# Patient Record
Sex: Male | Born: 1940 | Race: White | Hispanic: No | Marital: Married | Smoking: Former smoker
Health system: Southern US, Community
[De-identification: ages and names within clinical notes are randomized; demographics above are authoritative.]

## PROBLEM LIST (undated history)

## (undated) DIAGNOSIS — N138 Other obstructive and reflux uropathy: Secondary | ICD-10-CM

## (undated) DIAGNOSIS — Z973 Presence of spectacles and contact lenses: Secondary | ICD-10-CM

## (undated) DIAGNOSIS — N401 Enlarged prostate with lower urinary tract symptoms: Secondary | ICD-10-CM

## (undated) DIAGNOSIS — C61 Malignant neoplasm of prostate: Secondary | ICD-10-CM

## (undated) DIAGNOSIS — R339 Retention of urine, unspecified: Secondary | ICD-10-CM

## (undated) DIAGNOSIS — Z87898 Personal history of other specified conditions: Secondary | ICD-10-CM

## (undated) DIAGNOSIS — N529 Male erectile dysfunction, unspecified: Secondary | ICD-10-CM

## (undated) DIAGNOSIS — I10 Essential (primary) hypertension: Secondary | ICD-10-CM

## (undated) HISTORY — PX: PROSTATE BIOPSY: SHX241

## (undated) HISTORY — DX: Essential (primary) hypertension: I10

## (undated) HISTORY — DX: Malignant neoplasm of prostate: C61

---

## 2014-03-05 ENCOUNTER — Encounter: Payer: Self-pay | Admitting: Radiation Oncology

## 2014-03-05 NOTE — Progress Notes (Signed)
GU Location of Tumor / Histology: adenocarcinoma of prostate   If Prostate Cancer, Gleason Score is (3 + 3) and PSA is (6.2) April 2015.  Patient referred to Dr. Karsten Ro by PCP for elevated PSA December of 2012.  Biopsies of prostate (if applicable) revealed:     Past/Anticipated interventions by urology, if any: referred to radiation oncology  Past/Anticipated interventions by medical oncology, if any: None  Weight changes, if any: None noted  Bowel/Bladder complaints, if any: no voiding symptoms, no change in voiding pattern, denies hematuria or dysuria.   Nausea/Vomiting, if any: None noted  Pain issues, if any:  None noted  SAFETY ISSUES:  Prior radiation? NO  Pacemaker/ICD? NO  Possible current pregnancy? NO  Is the patient on methotrexate? NO  Current Complaints / other details:  73 year old. AX. Penicillin. Patient interested in proceeding with radioactive seed implantation. Prostate volume: 23.23 cc.

## 2014-03-08 ENCOUNTER — Encounter: Payer: Self-pay | Admitting: Radiation Oncology

## 2014-03-08 NOTE — Progress Notes (Signed)
Radiation Oncology         [phone] ________________________________  Initial outpatient Consultation  Name: Dustin Ramirez MRN: [id number]  Date: 03/09/2014  DOB: 23-Jun-1941  CC:No primary provider on file.  [name], MD   REFERRING PHYSICIAN: [name], MD  DIAGNOSIS: 73 y.o. gentleman with stage T1c adenocarcinoma of the prostate with a Gleason's score of 3+3 and a PSA of 6.2  HISTORY OF PRESENT ILLNESS::Dustin Ramirez is a 72 y.o. gentleman.  He was noted to have an elevated PSA of 6.2 on 12/31/13  by his primary care PA, Inez Catalina.  Accordingly, he was referred for evaluation in urology by Dr. Karsten Ro and digital rectal examination revealed no nodules within the prostate.  The patient proceeded to transrectal ultrasound with 12 biopsies of the prostate on 02/17/14.  The prostate volume measured 23.23 cc.  Out of 12 core biopsies, 5 were positive.  The maximum Gleason score was 3+3, and this was seen bilaterally as displayed below:    The patient reviewed the biopsy results with his urologist and he has kindly been referred today for discussion of potential radiation treatment options.  PREVIOUS RADIATION THERAPY: No  PAST MEDICAL HISTORY:  has a past medical history of Prostate cancer and Hypertension.    PAST SURGICAL HISTORY: Past Surgical History  Procedure Laterality Date  . Prostate biopsy      FAMILY HISTORY: family history includes Cancer in his father and mother; Diabetes in his brother.  SOCIAL HISTORY:  reports that he has quit smoking. His smoking use included Cigarettes. He smoked 0.00 packs per day. He has never used smokeless tobacco. He reports that he drinks alcohol. He reports that he does not use illicit drugs.  ALLERGIES: Penicillins  MEDICATIONS:  Current Outpatient Prescriptions  Medication Sig Dispense Refill  . docusate sodium (COLACE) 100 MG capsule Take 100 mg by mouth 2 (two) times daily.      [name] lisinopril (PRINIVIL,ZESTRIL) 10  MG tablet Take 10 mg by mouth daily.      . milk thistle 175 MG tablet Take 175 mg by mouth daily.      . Omega-3 Fatty Acids (FISH OIL) 1000 MG CAPS Take by mouth.      . sildenafil (VIAGRA) 50 MG tablet Take 50 mg by mouth daily as needed for erectile dysfunction.       No current facility-administered medications for this encounter.    REVIEW OF SYSTEMS:  A 15 point review of systems is documented in the electronic medical record. This was obtained by the nursing staff. However, I reviewed this with the patient to discuss relevant findings and make appropriate changes.  A comprehensive review of systems was negative..  The patient completed an IPSS and IIEF questionnaire.  His IPSS score was 8 indicating moderate urinary outflow obstructive symptoms.  He indicated that his erectile function is able to complete sexual activity on about half the attempts.   PHYSICAL EXAM: This patient is in no acute distress.  He is alert and oriented.   vitals were not taken for this visit. He exhibits no respiratory distress or labored breathing.  He appears neurologically intact.  His mood is pleasant.  His affect is appropriate.  Please note the digital rectal exam findings described above.  KPS = 100  100 - Normal; no complaints; no evidence of disease. 90   - Able to carry on normal activity; minor signs or symptoms of disease. 80   - Normal activity  with effort; some signs or symptoms of disease. 48   - Cares for self; unable to carry on normal activity or to do active work. 60   - Requires occasional assistance, but is able to care for most of his personal needs. 50   - Requires considerable assistance and frequent medical care. 72   - Disabled; requires special care and assistance. 65   - Severely disabled; hospital admission is indicated although death not imminent. 20   - Very sick; hospital admission necessary; active supportive treatment necessary. 10   - Moribund; fatal processes progressing  rapidly. 0     - Dead  Karnofsky DA, Abelmann WH, Craver LS and Burchenal JH [phone]) The use of the nitrogen mustards in the palliative treatment of carcinoma: with particular reference to bronchogenic carcinoma Cancer 1 634-56   LABORATORY DATA:  No results found for this basename: WBC,  HGB,  HCT,  MCV,  PLT   No results found for this basename: NA,  K,  CL,  CO2   No results found for this basename: ALT,  AST,  GGT,  ALKPHOS,  BILITOT     RADIOGRAPHY: No results found.    IMPRESSION: This gentleman is a 73 y.o. gentleman with stage T1c adenocarcinoma of the prostate with a Gleason's score of 3+3 and a PSA of 6.2.  His T-Stage, Gleason's Score, and PSA put him into the favorable risk group.  Accordingly he is eligible for a variety of potential treatment options including active surveillance, prostatectomy, or radiation in the form of seed implant versus IMRT.  PLAN:Today I reviewed the findings and workup thus far.  We discussed the natural history of prostate cancer.  We reviewed the the implications of T-stage, Gleason's Score, and PSA on decision-making and outcomes in prostate cancer.  We discussed radiation treatment in the management of prostate cancer with regard to the logistics and delivery of external beam radiation treatment as well as the logistics and delivery of prostate brachytherapy.  We compared and contrasted each of these approaches and also compared these against prostatectomy.  The patient expressed interest in prostate brachytherapy.  I filled out a patient counseling form for him with relevant treatment diagrams and we retained a copy for our records.   The patient would like to proceed with prostate brachytherapy.  I will share my findings with Dr. Karsten Ro and move forward with scheduling the procedure in the near future.     I enjoyed meeting with him today, and will look forward to participating in the care of this very nice gentleman.  I spent 60 minutes face to  face with the patient and more than 50% of that time was spent in counseling and/or coordination of care.   ------------------------------------------------  Sheral Apley. Tammi Klippel, M.D.

## 2014-03-09 ENCOUNTER — Ambulatory Visit
Admission: RE | Admit: 2014-03-09 | Discharge: 2014-03-09 | Disposition: A | Discharge status: Home / Self Care | Payer: Medicare Other | Source: Ambulatory Visit | Attending: Radiation Oncology | Admitting: Radiation Oncology

## 2014-03-09 ENCOUNTER — Other Ambulatory Visit: Payer: Self-pay | Admitting: Urology

## 2014-03-09 ENCOUNTER — Encounter: Payer: Self-pay | Admitting: Radiation Oncology

## 2014-03-09 VITALS — BP 132/70 | HR 82 | Temp 98.0°F | Resp 16 | Ht 71.0 in | Wt 191.2 lb

## 2014-03-09 DIAGNOSIS — C61 Malignant neoplasm of prostate: Secondary | ICD-10-CM | POA: Insufficient documentation

## 2014-03-09 DIAGNOSIS — Z510 Encounter for antineoplastic radiation therapy: Secondary | ICD-10-CM | POA: Insufficient documentation

## 2014-03-09 DIAGNOSIS — Z87891 Personal history of nicotine dependence: Secondary | ICD-10-CM | POA: Diagnosis not present

## 2014-03-09 DIAGNOSIS — I10 Essential (primary) hypertension: Secondary | ICD-10-CM | POA: Insufficient documentation

## 2014-03-09 NOTE — Progress Notes (Signed)
Reports nocturia x1. Denies hematuria or dysuria. Reports ten pound weight loss over the last year. Denies bone pain. Denies urgency or frequency. Reports a steady stream without difficulty emptying. Denies nausea, vomiting, headache or dizziness. Most interested in seeds. Cares for his wife who has had two strokes. Requesting surgery be scheduled on a Thursday or Friday with notice so his son from [place] can come to help care for his father and mother.

## 2014-03-10 ENCOUNTER — Telehealth: Payer: Self-pay | Admitting: *Deleted

## 2014-03-10 NOTE — Telephone Encounter (Signed)
CALLED PATIENT TO INFORM OF PRE-SEED APPT. AND IMPLANT, SPOKE WITH PATIENT AND HE IS AWARE OF THESE APPTS.

## 2014-03-12 ENCOUNTER — Telehealth: Payer: Self-pay | Admitting: *Deleted

## 2014-03-12 NOTE — Telephone Encounter (Signed)
Called patient to remind of appts. For 03-13-14, spoke with patient and he is aware of these appts.

## 2014-03-13 ENCOUNTER — Ambulatory Visit
Admission: RE | Admit: 2014-03-13 | Discharge: 2014-03-13 | Disposition: A | Discharge status: Home / Self Care | Payer: Medicare Other | Source: Ambulatory Visit | Attending: Radiation Oncology | Admitting: Radiation Oncology

## 2014-03-13 ENCOUNTER — Encounter: Payer: Self-pay | Admitting: Radiation Oncology

## 2014-03-13 ENCOUNTER — Encounter (HOSPITAL_BASED_OUTPATIENT_CLINIC_OR_DEPARTMENT_OTHER)
Admission: RE | Admit: 2014-03-13 | Discharge: 2014-03-13 | Disposition: A | Discharge status: Home / Self Care | Payer: Medicare Other | Source: Ambulatory Visit | Attending: Urology | Admitting: Urology

## 2014-03-13 ENCOUNTER — Other Ambulatory Visit: Payer: Self-pay

## 2014-03-13 ENCOUNTER — Ambulatory Visit (HOSPITAL_COMMUNITY)
Admission: RE | Admit: 2014-03-13 | Discharge: 2014-03-13 | Disposition: A | Discharge status: Home / Self Care | Payer: Medicare Other | Source: Ambulatory Visit | Attending: Urology | Admitting: Urology

## 2014-03-13 DIAGNOSIS — C61 Malignant neoplasm of prostate: Secondary | ICD-10-CM | POA: Diagnosis not present

## 2014-03-13 DIAGNOSIS — Z510 Encounter for antineoplastic radiation therapy: Secondary | ICD-10-CM | POA: Diagnosis not present

## 2014-03-13 DIAGNOSIS — Z87891 Personal history of nicotine dependence: Secondary | ICD-10-CM | POA: Diagnosis not present

## 2014-03-13 DIAGNOSIS — Z01818 Encounter for other preprocedural examination: Secondary | ICD-10-CM | POA: Insufficient documentation

## 2014-03-13 DIAGNOSIS — Z01810 Encounter for preprocedural cardiovascular examination: Secondary | ICD-10-CM | POA: Insufficient documentation

## 2014-03-13 NOTE — Progress Notes (Signed)
  Radiation Oncology         [phone] ________________________________  Name: Rosana Hoes. Hechler MRN: [id number]  Date: 03/13/2014  DOB: [birth year]  SIMULATION AND TREATMENT PLANNING NOTE PUBIC ARCH STUDY  CC:No PCP Per Patient  [name], MD  DIAGNOSIS: 73 y.o. gentleman with stage T1c adenocarcinoma of the prostate with a Gleason's score of 3+3 and a PSA of 6.2  COMPLEX SIMULATION:  The patient presented today for evaluation for possible prostate seed implant. He was brought to the radiation planning suite and placed supine on the CT couch. A 3-dimensional image study set was obtained in upload to the planning computer. There, on each axial slice, I contoured the prostate gland. Then, using three-dimensional radiation planning tools I reconstructed the prostate in view of the structures from the transperineal needle pathway to assess for possible pubic arch interference. In doing so, I did not appreciate any pubic arch interference. Also, the patient's prostate volume was estimated based on the drawn structure. The volume was 29.57 cc.  Given the pubic arch appearance and prostate volume, patient remains a good candidate to proceed with prostate seed implant. Today, he freely provided informed written consent to proceed.    PLAN: The patient will undergo prostate seed implant.   ________________________________  Sheral Apley. Tammi Klippel, M.D.

## 2014-04-30 ENCOUNTER — Telehealth: Payer: Self-pay | Admitting: *Deleted

## 2014-04-30 NOTE — Telephone Encounter (Signed)
Called patient to remind of lab appt. For 05-01-14, spoke with patient and he is aware of this appt.

## 2014-05-01 ENCOUNTER — Encounter (HOSPITAL_BASED_OUTPATIENT_CLINIC_OR_DEPARTMENT_OTHER): Payer: Self-pay | Admitting: *Deleted

## 2014-05-01 DIAGNOSIS — C61 Malignant neoplasm of prostate: Secondary | ICD-10-CM | POA: Diagnosis present

## 2014-05-01 DIAGNOSIS — K219 Gastro-esophageal reflux disease without esophagitis: Secondary | ICD-10-CM | POA: Diagnosis not present

## 2014-05-01 DIAGNOSIS — Z79899 Other long term (current) drug therapy: Secondary | ICD-10-CM | POA: Diagnosis not present

## 2014-05-01 DIAGNOSIS — Z800 Family history of malignant neoplasm of digestive organs: Secondary | ICD-10-CM | POA: Diagnosis not present

## 2014-05-01 DIAGNOSIS — I10 Essential (primary) hypertension: Secondary | ICD-10-CM | POA: Diagnosis not present

## 2014-05-01 DIAGNOSIS — Z880 Allergy status to penicillin: Secondary | ICD-10-CM | POA: Diagnosis not present

## 2014-05-01 DIAGNOSIS — N529 Male erectile dysfunction, unspecified: Secondary | ICD-10-CM | POA: Diagnosis not present

## 2014-05-01 DIAGNOSIS — Z7982 Long term (current) use of aspirin: Secondary | ICD-10-CM | POA: Diagnosis not present

## 2014-05-01 DIAGNOSIS — Z87891 Personal history of nicotine dependence: Secondary | ICD-10-CM | POA: Diagnosis not present

## 2014-05-01 LAB — CBC
HCT: 44.3 % (ref 39.0–52.0)
Hemoglobin: 15.5 g/dL (ref 13.0–17.0)
MCH: 32 pg (ref 26.0–34.0)
MCHC: 35 g/dL (ref 30.0–36.0)
MCV: 91.3 fL (ref 78.0–100.0)
Platelets: 173 10*3/uL (ref 150–400)
RBC: 4.85 MIL/uL (ref 4.22–5.81)
RDW: 12.8 % (ref 11.5–15.5)
WBC: 6.4 10*3/uL (ref 4.0–10.5)

## 2014-05-01 LAB — COMPREHENSIVE METABOLIC PANEL
ALT: 157 U/L — ABNORMAL HIGH (ref 0–53)
AST: 91 U/L — ABNORMAL HIGH (ref 0–37)
Albumin: 4.1 g/dL (ref 3.5–5.2)
Alkaline Phosphatase: 67 U/L (ref 39–117)
Anion gap: 15 (ref 5–15)
BUN: 15 mg/dL (ref 6–23)
CO2: 26 mEq/L (ref 19–32)
Calcium: 10.5 mg/dL (ref 8.4–10.5)
Chloride: 98 mEq/L (ref 96–112)
Creatinine, Ser: 0.94 mg/dL (ref 0.50–1.35)
GFR calc Af Amer: >90 mL/min (ref >90)
GFR calc non Af Amer: 82 mL/min — ABNORMAL LOW (ref >90)
Glucose, Bld: 114 mg/dL — ABNORMAL HIGH (ref 70–99)
Potassium: 4.8 mEq/L (ref 3.7–5.3)
Sodium: 139 mEq/L (ref 137–147)
Total Bilirubin: 0.6 mg/dL (ref 0.3–1.2)
Total Protein: 7.8 g/dL (ref 6.0–8.3)

## 2014-05-01 LAB — APTT: aPTT: 28 seconds (ref 24–37)

## 2014-05-01 LAB — PROTIME-INR
INR: 1.04 (ref 0.00–1.49)
Prothrombin Time: 13.6 seconds (ref 11.6–15.2)

## 2014-05-01 NOTE — Progress Notes (Signed)
05/01/14 0919  OBSTRUCTIVE SLEEP APNEA  Have you ever been diagnosed with sleep apnea through a sleep study? No  Do you snore loudly (loud enough to be heard through closed doors)?  1  Do you often feel tired, fatigued, or sleepy during the daytime? 0  Has anyone observed you stop breathing during your sleep? 0  Do you have, or are you being treated for high blood pressure? 1  BMI more than 35 kg/m2? 0  Age over 72 years old? 1  Neck circumference greater than 40 cm/16 inches? 0  Gender: 1  Obstructive Sleep Apnea Score 4  Score 4 or greater  Results sent to PCP

## 2014-05-01 NOTE — Progress Notes (Signed)
NPO AFTER MN. ARRIVE AT 0800. CURRENT CXR, EKG, AND LAB RESULTS IN EPIC AND CHART. WILL DO FLEET ENEMA AM DOS.

## 2014-05-07 ENCOUNTER — Telehealth: Payer: Self-pay | Admitting: *Deleted

## 2014-05-07 NOTE — Telephone Encounter (Signed)
CALLED PATIENT TO REMIND OF PROCEDURE FOR 05-08-14, LVM FOR A RETURN CALL

## 2014-05-08 ENCOUNTER — Encounter (HOSPITAL_BASED_OUTPATIENT_CLINIC_OR_DEPARTMENT_OTHER)
Admission: RE | Disposition: A | Discharge status: Home / Self Care | Payer: Self-pay | Source: Ambulatory Visit | Attending: Urology

## 2014-05-08 ENCOUNTER — Encounter (HOSPITAL_BASED_OUTPATIENT_CLINIC_OR_DEPARTMENT_OTHER): Payer: Medicare Other | Admitting: Anesthesiology

## 2014-05-08 ENCOUNTER — Encounter (HOSPITAL_BASED_OUTPATIENT_CLINIC_OR_DEPARTMENT_OTHER): Payer: Self-pay | Admitting: Anesthesiology

## 2014-05-08 ENCOUNTER — Ambulatory Visit (HOSPITAL_BASED_OUTPATIENT_CLINIC_OR_DEPARTMENT_OTHER): Payer: Medicare Other | Admitting: Anesthesiology

## 2014-05-08 ENCOUNTER — Ambulatory Visit (HOSPITAL_BASED_OUTPATIENT_CLINIC_OR_DEPARTMENT_OTHER)
Admission: RE | Admit: 2014-05-08 | Discharge: 2014-05-08 | Disposition: A | Discharge status: Home / Self Care | Payer: Medicare Other | Source: Ambulatory Visit | Attending: Urology | Admitting: Urology

## 2014-05-08 ENCOUNTER — Ambulatory Visit (HOSPITAL_COMMUNITY): Payer: Medicare Other

## 2014-05-08 DIAGNOSIS — I10 Essential (primary) hypertension: Secondary | ICD-10-CM | POA: Insufficient documentation

## 2014-05-08 DIAGNOSIS — C61 Malignant neoplasm of prostate: Secondary | ICD-10-CM | POA: Insufficient documentation

## 2014-05-08 DIAGNOSIS — Z7982 Long term (current) use of aspirin: Secondary | ICD-10-CM | POA: Insufficient documentation

## 2014-05-08 DIAGNOSIS — Z79899 Other long term (current) drug therapy: Secondary | ICD-10-CM | POA: Insufficient documentation

## 2014-05-08 DIAGNOSIS — N529 Male erectile dysfunction, unspecified: Secondary | ICD-10-CM | POA: Diagnosis not present

## 2014-05-08 DIAGNOSIS — Z800 Family history of malignant neoplasm of digestive organs: Secondary | ICD-10-CM | POA: Insufficient documentation

## 2014-05-08 DIAGNOSIS — Z880 Allergy status to penicillin: Secondary | ICD-10-CM | POA: Insufficient documentation

## 2014-05-08 DIAGNOSIS — K219 Gastro-esophageal reflux disease without esophagitis: Secondary | ICD-10-CM | POA: Diagnosis not present

## 2014-05-08 DIAGNOSIS — Z87891 Personal history of nicotine dependence: Secondary | ICD-10-CM | POA: Insufficient documentation

## 2014-05-08 HISTORY — PX: RADIOACTIVE SEED IMPLANT: SHX5150

## 2014-05-08 HISTORY — DX: Male erectile dysfunction, unspecified: N52.9

## 2014-05-08 HISTORY — DX: Presence of spectacles and contact lenses: Z97.3

## 2014-05-08 SURGERY — INSERTION, RADIATION SOURCE, PROSTATE
Anesthesia: General | Site: Prostate

## 2014-05-08 MED ORDER — PROPOFOL 10 MG/ML IV BOLUS
INTRAVENOUS | Status: DC | PRN
  Administered 2014-05-08: 160 mg via INTRAVENOUS
  Administered 2014-05-08: 30 mg via INTRAVENOUS

## 2014-05-08 MED ORDER — FENTANYL CITRATE 0.05 MG/ML IJ SOLN
INTRAMUSCULAR | Status: AC
  Filled 2014-05-08: qty 2

## 2014-05-08 MED ORDER — MIDAZOLAM HCL 2 MG/2ML IJ SOLN
INTRAMUSCULAR | Status: AC
  Filled 2014-05-08: qty 2

## 2014-05-08 MED ORDER — TAMSULOSIN HCL 0.4 MG PO CAPS: 0.4000 mg | ORAL_CAPSULE | ORAL | Status: DC

## 2014-05-08 MED ORDER — MIDAZOLAM HCL 5 MG/5ML IJ SOLN
INTRAMUSCULAR | Status: DC | PRN
  Administered 2014-05-08: 2 mg via INTRAVENOUS

## 2014-05-08 MED ORDER — LIDOCAINE HCL (CARDIAC) 20 MG/ML IV SOLN
INTRAVENOUS | Status: DC | PRN
  Administered 2014-05-08: 70 mg via INTRAVENOUS

## 2014-05-08 MED ORDER — ACETAMINOPHEN 10 MG/ML IV SOLN
INTRAVENOUS | Status: DC | PRN
  Administered 2014-05-08: 1000 mg via INTRAVENOUS

## 2014-05-08 MED ORDER — HYDROCODONE-ACETAMINOPHEN 5-325 MG PO TABS
ORAL_TABLET | ORAL | Status: AC
  Filled 2014-05-08: qty 1

## 2014-05-08 MED ORDER — KETOROLAC TROMETHAMINE 30 MG/ML IJ SOLN
INTRAMUSCULAR | Status: DC | PRN
  Administered 2014-05-08: 15 mg via INTRAVENOUS

## 2014-05-08 MED ORDER — CIPROFLOXACIN IN D5W 400 MG/200ML IV SOLN
400.0000 mg | INTRAVENOUS | Status: AC
  Administered 2014-05-08: 400 mg via INTRAVENOUS
  Filled 2014-05-08: qty 200

## 2014-05-08 MED ORDER — STERILE WATER FOR IRRIGATION IR SOLN
Status: DC | PRN
  Administered 2014-05-08: 3 mL
  Administered 2014-05-08: 3000 mL

## 2014-05-08 MED ORDER — IOHEXOL 350 MG/ML SOLN
INTRAVENOUS | Status: DC | PRN
  Administered 2014-05-08: 7 mL via INTRAVENOUS

## 2014-05-08 MED ORDER — DEXAMETHASONE SODIUM PHOSPHATE 4 MG/ML IJ SOLN
INTRAMUSCULAR | Status: DC | PRN
  Administered 2014-05-08: 4 mg via INTRAVENOUS

## 2014-05-08 MED ORDER — FENTANYL CITRATE 0.05 MG/ML IJ SOLN
INTRAMUSCULAR | Status: DC | PRN
  Administered 2014-05-08 (×3): 50 ug via INTRAVENOUS

## 2014-05-08 MED ORDER — FLEET ENEMA 7-19 GM/118ML RE ENEM
1.0000 | ENEMA | Freq: Once | RECTAL | Status: DC
  Filled 2014-05-08: qty 1

## 2014-05-08 MED ORDER — LACTATED RINGERS IV SOLN
INTRAVENOUS | Status: DC
  Filled 2014-05-08: qty 1000

## 2014-05-08 MED ORDER — ONDANSETRON HCL 4 MG/2ML IJ SOLN
INTRAMUSCULAR | Status: DC | PRN
  Administered 2014-05-08: 4 mg via INTRAVENOUS

## 2014-05-08 MED ORDER — FENTANYL CITRATE 0.05 MG/ML IJ SOLN
25.0000 ug | INTRAMUSCULAR | Status: DC | PRN
  Administered 2014-05-08: 50 ug via INTRAVENOUS
  Filled 2014-05-08: qty 1

## 2014-05-08 MED ORDER — HYDROCODONE-ACETAMINOPHEN 10-325 MG PO TABS: 1.0000 | ORAL_TABLET | ORAL | Status: DC | PRN

## 2014-05-08 MED ORDER — FENTANYL CITRATE 0.05 MG/ML IJ SOLN
INTRAMUSCULAR | Status: AC
  Filled 2014-05-08: qty 4

## 2014-05-08 MED ORDER — LACTATED RINGERS IV SOLN
INTRAVENOUS | Status: DC
  Administered 2014-05-08: 09:00:00 via INTRAVENOUS
  Filled 2014-05-08: qty 1000

## 2014-05-08 MED ORDER — CIPROFLOXACIN HCL 500 MG PO TABS: 500.0000 mg | ORAL_TABLET | Freq: Two times a day (BID) | ORAL | Status: DC

## 2014-05-08 SURGICAL SUPPLY — 26 items
BAG URINE DRAINAGE (UROLOGICAL SUPPLIES) ×2 IMPLANT
BLADE CLIPPER SURG (BLADE) ×2 IMPLANT
CATH FOLEY 2WAY SLVR  5CC 16FR (CATHETERS) ×2
CATH FOLEY 2WAY SLVR 5CC 16FR (CATHETERS) ×2 IMPLANT
CATH ROBINSON RED A/P 20FR (CATHETERS) ×2 IMPLANT
CLOTH BEACON ORANGE TIMEOUT ST (SAFETY) ×2 IMPLANT
COVER MAYO STAND STRL (DRAPES) ×2 IMPLANT
COVER TABLE BACK 60X90 (DRAPES) ×2 IMPLANT
DRSG TEGADERM 4X4.75 (GAUZE/BANDAGES/DRESSINGS) ×2 IMPLANT
DRSG TEGADERM 8X12 (GAUZE/BANDAGES/DRESSINGS) ×2 IMPLANT
GLOVE BIO SURGEON STRL SZ7.5 (GLOVE) IMPLANT
GLOVE BIO SURGEON STRL SZ8 (GLOVE) ×4 IMPLANT
GLOVE BIOGEL PI IND STRL 7.5 (GLOVE) ×1 IMPLANT
GLOVE BIOGEL PI INDICATOR 7.5 (GLOVE) ×1
GLOVE ECLIPSE 8.0 STRL XLNG CF (GLOVE) ×8 IMPLANT
GLOVE INDICATOR 7.5 STRL GRN (GLOVE) ×2 IMPLANT
GOWN STRL REUS W/ TWL XL LVL3 (GOWN DISPOSABLE) ×2 IMPLANT
GOWN STRL REUS W/TWL XL LVL3 (GOWN DISPOSABLE) ×2
HOLDER FOLEY CATH W/STRAP (MISCELLANEOUS) ×2 IMPLANT
PACK CYSTOSCOPY (CUSTOM PROCEDURE TRAY) ×2 IMPLANT
SPONGE GAUZE 4X4 12PLY STER LF (GAUZE/BANDAGES/DRESSINGS) ×2 IMPLANT
SYRINGE 10CC LL (SYRINGE) ×2 IMPLANT
UNDERPAD 30X30 INCONTINENT (UNDERPADS AND DIAPERS) ×4 IMPLANT
WATER STERILE IRR 3000ML UROMA (IV SOLUTION) ×2 IMPLANT
WATER STERILE IRR 500ML POUR (IV SOLUTION) ×2 IMPLANT
radioactive seeds ×156 IMPLANT

## 2014-05-08 NOTE — Op Note (Signed)
PATIENT:  Dustin Ramirez  PRE-OPERATIVE DIAGNOSIS:  Adenocarcinoma of the prostate  POST-OPERATIVE DIAGNOSIS:  Same  PROCEDURE:  Procedure(s): 1. I-125 radioactive seed implantation 2. Cystoscopy  SURGEON:  Surgeon(s): [name]  Radiation oncologist: Dr. Tyler Pita  ANESTHESIA:  General  EBL:  Minimal  DRAINS: 16 French Foley catheter  INDICATION: Dustin Ramirez is a 73 year old male with biopsy-proven adenocarcinoma of the prostate Gleason score 3+3 = 6 in 5 cores involving both lobes of the prostate. We discussed the treatment options. He does have erectile dysfunction currently managed with Viagra. He has elected to proceed with radioactive seed implantation.  Description of procedure: After informed consent the patient was brought to the major OR, placed on the table and administered general anesthesia. He was then moved to the modified lithotomy position with his perineum perpendicular to the floor. His perineum and genitalia were then sterilely prepped. An official timeout was then performed. A 16 French Foley catheter was then placed in the bladder and filled with dilute contrast, a rectal tube was placed in the rectum and the transrectal ultrasound probe was placed in the rectum and affixed to the stand. He was then sterilely draped.  Real time ultrasonography was used along with the seed planning software spot-pro version 3.1-00. This was used to develop the seed plan including the number of needles as well as number of seeds required for complete and adequate coverage. Real-time ultrasonography was then used along with the previously developed plan and the Nucletron device to implant a total of 78 seeds using 24 needles. This proceeded without difficulty or complication.  A Foley catheter was then removed as well as the transrectal ultrasound probe and rectal probe. Flexible cystoscopy was then performed using the 17 French flexible scope which revealed a normal urethra  throughout its length down to the sphincter which appeared intact. The prostatic urethra revealed bilobar hypertrophy but no evidence of obstruction, seeds, spacers or lesions. The bladder was then entered and fully and systematically inspected. The ureteral orifices were noted to be of normal configuration and position. The mucosa revealed no evidence of tumors. There were also no stones identified within the bladder. I noted no seeds or spacers on the floor of the bladder and retroflexion of the scope revealed no seeds protruding from the base of the prostate.  The cystoscope was then removed and a new 67 French Foley catheter was then inserted and the balloon was filled with 10 cc of sterile water. This was connected to closed system drainage and the patient was awakened and taken to recovery room in stable and satisfactory condition. He tolerated procedure well and there were no intraoperative complications.

## 2014-05-08 NOTE — Procedures (Signed)
  Radiation Oncology         ([phone]) [phone] ________________________________  Name: Dustin Ramirez. Cheuvront MRN: [id number]  Date: 05/08/2014  DOB: [birth year]       Prostate Seed Implant  CC:No PCP Per Patient  No ref. provider found  DIAGNOSIS: 73 y.o. gentleman with stage T1c adenocarcinoma of the prostate with a Gleason's score of 3+3 and a PSA of 6.2  PROCEDURE: Insertion of radioactive I-125 seeds into the prostate gland.  RADIATION DOSE: 145 Gy, definitive therapy.  TECHNIQUE: Oran K. Vaile was brought to the operating room with the urologist. He was placed in the dorsolithotomy position. He was catheterized and a rectal tube was inserted. The perineum was shaved, prepped and draped. The ultrasound probe was then introduced into the rectum to see the prostate gland.  TREATMENT DEVICE: A needle grid was attached to the ultrasound probe stand and anchor needles were placed.  3D PLANNING: The prostate was imaged in 3D using a sagittal sweep of the prostate probe. These images were transferred to the planning computer. There, the prostate, urethra and rectum were defined on each axial reconstructed image. Then, the software created an optimized 3D plan and a few seed positions were adjusted. The quality of the plan was reviewed using [facility] information for the target and the following two organs at risk:  Urethra and Rectum.  Then the accepted plan was uploaded to the seed Selectron afterloading unit.  PROSTATE VOLUME STUDY:  Using transrectal ultrasound the volume of the prostate was verified to be 34.96 cc.  SPECIAL TREATMENT PROCEDURE/SUPERVISION AND HANDLING: The Nucletron FIRST system was used to place the needles under sagittal guidance. A total of 24 needles were used to deposit 78 seeds in the prostate gland. The individual seed activity was 0.395 mCi for a total implant activity of 30.81 mCi.  COMPLEX SIMULATION: At the end of the procedure, an anterior radiograph of the pelvis was obtained to  document seed positioning and count. Cystoscopy was performed to check the urethra and bladder.  MICRODOSIMETRY: At the end of the procedure, the patient was emitting 0.05 mrem/hr at 1 meter. Accordingly, he was considered safe for hospital discharge.  PLAN: The patient will return to the radiation oncology clinic for post implant CT dosimetry in three weeks.   ________________________________  Sheral Apley Tammi Klippel, M.D.

## 2014-05-08 NOTE — Anesthesia Postprocedure Evaluation (Signed)
  Anesthesia Post-op Note  Patient: Dustin Ramirez. Collard  Procedure(s) Performed: Procedure(s) (LRB): RADIOACTIVE SEED IMPLANT, cystoscopy (N/A)  Patient Location: PACU  Anesthesia Type: General  Level of Consciousness: awake and alert   Airway and Oxygen Therapy: Patient Spontanous Breathing  Post-op Pain: mild  Post-op Assessment: Post-op Vital signs reviewed, Patient's Cardiovascular Status Stable, Respiratory Function Stable, Patent Airway and No signs of Nausea or vomiting  Last Vitals:  Filed Vitals:   05/08/14 1200  BP: 126/61  Pulse: 59  Temp:   Resp: 11    Post-op Vital Signs: stable   Complications: No apparent anesthesia complications

## 2014-05-08 NOTE — H&P (Signed)
[name] is a 73 year old male with newly diagnosed prostate cancer.    History of Present Illness Adenocarcinoma the prostate: He was found to have an elevated PSA and 4/15 with no abnormality noted on DRE. There is no family history of prostate cancer.    PSAs:  12/12 - 2.9  4/15 - 6.2  TRUS/BX 02/17/14: Prostate volume - 23 cc with no abnormality noted on ultrasound.  Pathology: Adenocarcinoma Gleason 3+3 = 6 in both lobes 5/12 cores positive.     Erectile dysfunction: This has been managed with Viagra 50 mg.    Interval history: He reported having no significant difficulties following his prostate biopsy.   Past Medical History Problems  1. History of hypertension (V12.59)  Surgical History Problems  1. History of Biopsy Of The Prostate Needle 2. History of No Surgical Problems  Current Meds 1. Aspirin 325 MG Oral Tablet;  Therapy: (Recorded:30Apr2015) to Recorded 2. CefTRIAXone Sodium 1 GM Injection Solution Reconstituted; INJECT 1  GM  Intramuscular To be done befor procedure; To Be Done: [id number]; Status: HOLD FOR  - Administration Ordered 3. Fish Oil 1000 MG Oral Capsule;  Therapy: (Recorded:30Apr2015) to Recorded 4. Lisinopril 10 MG Oral Tablet;  Therapy: (Recorded:30Apr2015) to Recorded 5. Milk Thistle CAPS;  Therapy: (Recorded:30Apr2015) to Recorded  Allergies Medication  1. Penicillins  Family History Problems  1. Family history of Colon cancer : Mother 2. Family history of diabetes mellitus (V18.0) : Brother 3. Family history of lung cancer (V16.1) : Father  Social History Problems  1. Alcohol use (V49.89)   2 glasses of wine at night 2. Caffeine use (V49.89)   2 coffees in the morning 3. Father deceased 65. Former smoker (V15.82) 5. Married 6. Mother deceased 6. Number of children   1 daughter; 1 son   Review of Systems Genitourinary, constitutional, skin, eye, otolaryngeal, hematologic/lymphatic, cardiovascular, pulmonary,  endocrine, musculoskeletal, gastrointestinal, neurological and psychiatric system(s) were reviewed and pertinent findings if present are noted.  Genitourinary: urinary frequency and erectile dysfunction.  Gastrointestinal: heartburn.  Eyes: blurred vision.    Vitals Vital Signs  Height: 5 ft 11 in Weight: 194 lb  BMI Calculated: 27.06 BSA Calculated: 2.08 Blood Pressure: 145 / 82 Temperature: 96.9 F Heart Rate: 63  Physical Exam Constitutional: Well nourished and well developed . No acute distress.  ENT:. The ears and nose are normal in appearance.  Neck: The appearance of the neck is normal and no neck mass is present.  Pulmonary: No respiratory distress and normal respiratory rhythm and effort.  Cardiovascular: Heart rate and rhythm are normal . No peripheral edema.  Abdomen: The abdomen is soft and nontender. No masses are palpated. No CVA tenderness. No hernias are palpable. No hepatosplenomegaly noted.  Rectal: Rectal exam demonstrates normal sphincter tone, no tenderness and no masses. Estimated prostate size is 1+. There is obliteration of the median sulcus. The prostate has no nodularity and is not tender. The left seminal vesicle is nonpalpable. The right seminal vesicle is nonpalpable. The perineum is normal on inspection.  Genitourinary: Examination of the penis demonstrates no discharge, no masses, no lesions and a normal meatus. The scrotum is without lesions. The right epididymis is palpably normal and non-tender. The left epididymis is palpably normal and non-tender. The right testis is non-tender and without masses. The left testis is non-tender and without masses.  Lymphatics: The femoral and inguinal nodes are not enlarged or tender.  Skin: Normal skin turgor, no visible rash and no visible skin lesions.  Neuro/Psych:.  Mood and affect are appropriate.   Partin table results: He has a 69% chance of organ confined disease with a 24% chance of extracapsular extension and a  2% probability of seminal vesicle and a 2% probability of lymph node involvement.   Assessment Assessed  1. Adenocarcinoma of prostate (185)  I went over his pathology report with him today as well as the significance of his Gleason score, number and location of cores positive and percent of cores positive. We then discussed his Partin table results in detail and the significance of these predictions as far as prognosis and need for further workup are concerned. I then discussed with him the various options available including active surveillance and treatment for cure such as radiation and surgery.    Plan Adenocarcinoma of prostate   The patient was counseled about the natural history of prostate cancer and the standard treatment options that are available for prostate cancer. It was explained to him how his age and life expectancy, clinical stage, Gleason score, and PSA affect his prognosis, the decision to proceed with additional staging studies, as well as how that information influences recommended treatment strategies. We discussed the roles for active surveillance, radiation therapy, surgical therapy, androgen deprivation, as well as ablative therapy options for the treatment of prostate cancer as appropriate to his individual cancer situation. We discussed the risks and benefits of these options with regard to their impact on cancer control and also in terms of potential adverse events, complications, and impact on quiality of life particularly related to urinary, bowel, and sexual function. The patient was encouraged to ask questions throughout the discussion today and all questions were answered to his stated satisfaction. In addition, the patient was provided with and/or directed to appropriate resources and literature for further education about prostate cancer and treatment options.     He has elected to proceed with radioactive seed implantation.

## 2014-05-08 NOTE — Discharge Instructions (Signed)
DISCHARGE INSTRUCTIONS FOR PROSTATE SEED IMPLANTATION ° °Removal of catheter °Remove the foley catheter after 24 hours ( day after the procedure).can be done easily by cutting the side port of the catheter, whichallow the balloon to deflate.  You will see 1-2 teaspoons of clear water as the balloon deflates and then the catheter can be slid out without difficulty. ° ° °     Cut here ° °Antibiotics °You may be given a prescription for an antibiotic to take when you arrive home. If so, be sure to take every tablet in the bottle, even if you are feeling better before the prescription is finished. If you begin itching, notice a rash or start to swell on your trunk, arms, legs and/or throat, immediately stop taking the antibiotic and call your Urologist. °Diet °Resume your usual diet when you return home. To keep your bowels moving easily and softly, drink prune, apple and cranberry juice at room temperature. You may also take a stool softener, such as Colace, which is available without prescription at local pharmacies. °Daily activities °  No driving or heavy lifting for at least two days after the implant. °  No bike riding, horseback riding or riding lawn mowers for the first month after the implant. °  Any strenuous physical activity should be approved by your doctor before you resume it. °Sexual relations °You may resume sexual relations two weeks after the procedure. A condom should be used for the first two weeks. Your semen may be dark brown or black; this is normal and is related bleeding that may have occurred during the implant. °Postoperative swelling °Expect swelling and bruising of the scrotum and perineum (the area between the scrotum and anus). Both the swelling and the bruising should resolve in l or 2 weeks. Ice packs and over- the-counter medications such as Tylenol, Advil or Aleve may lessen your discomfort. °Postoperative urination °Most men experience burning on urination and/or urinary frequency.  If this becomes bothersome, contact your Urologist.  Medication can be prescribed to relieve these problems.  It is normal to have some blood in your urine for a few days after the implant. °Special instructions related to the seeds °It is unlikely that you will pass an Iodine-125 seed in your urine. The seeds are silver in color and are about as large as a grain of rice. If you pass a seed, do not handle it with your fingers. Use a spoon to place it in an envelope or jar in place this in base occluded area such as the garage or basement for return to the radiation clinic at your convenience. ° °Contact your doctor for °  Temperature greater than 101 F °  Increasing pain °  Inability to urinate °Follow-up ° You should have follow up with your urologist and radiation oncologist about 3 weeks after the procedure. °General information regarding Iodine seeds °  Iodine-125 is a low energy radioactive material. It is not deeply penetrating and loses energy at short distances. Your prostate will absorb the radiation. Objects that are touched or used by the patient do not become radioactive. °  Body wastes (urine and stool) or body fluids (saliva, tears, semen or blood) are not radioactive. °  The Nuclear Regulatory Commission (NRC) has determined that no radiation precautions are needed for patients undergoing Iodine-125 seed implantation. The NRC states that such patients do not present a risk to the people around them, including young children and pregnant women. However, in keeping with the general principle   that radiation exposure should be kept as low reasonably possible, we suggest the following: °  Children and pets should not sit on the patient's lap for the first two (2) weeks after the implant. °  Pregnant (or possibly pregnant) women should avoid prolonged, close contact with the patient for the first two (2) weeks after the implant. °  A distance of three (3) feet is acceptable. °At a distance of three (3)  feet, there is no limit to the length of time anyone can be with the patient. °Post Anesthesia Home Care Instructions ° °Activity: °Get plenty of rest for the remainder of the day. A responsible adult should stay with you for 24 hours following the procedure.  °For the next 24 hours, DO NOT: °-Drive a car °-Operate machinery °-Drink alcoholic beverages °-Take any medication unless instructed by your physician °-Make any legal decisions or sign important papers. ° °Meals: °Start with liquid foods such as gelatin or soup. Progress to regular foods as tolerated. Avoid greasy, spicy, heavy foods. If nausea and/or vomiting occur, drink only clear liquids until the nausea and/or vomiting subsides. Call your physician if vomiting continues. ° °Special Instructions/Symptoms: °Your throat may feel dry or sore from the anesthesia or the breathing tube placed in your throat during surgery. If this causes discomfort, gargle with warm salt water. The discomfort should disappear within 24 hours. °   °

## 2014-05-08 NOTE — Anesthesia Procedure Notes (Signed)
Procedure Name: LMA Insertion Date/Time: 05/08/2014 9:48 AM Performed by: Bethena Roys T Pre-anesthesia Checklist: Patient identified, Emergency Drugs available, Suction available and Patient being monitored Patient Re-evaluated:Patient Re-evaluated prior to inductionOxygen Delivery Method: Circle System Utilized Preoxygenation: Pre-oxygenation with 100% oxygen Intubation Type: IV induction Ventilation: Mask ventilation without difficulty LMA: LMA inserted LMA Size: 5.0 Number of attempts: 1 Airway Equipment and Method: bite block Placement Confirmation: positive ETCO2 Dental Injury: Teeth and Oropharynx as per pre-operative assessment

## 2014-05-08 NOTE — Transfer of Care (Signed)
Immediate Anesthesia Transfer of Care Note  Patient: Dustin Ramirez. Judeth Horn  Procedure(s) Performed: Procedure(s) with comments: RADIOACTIVE SEED IMPLANT, cystoscopy (N/A) - dr portable   Patient Location: PACU  Anesthesia Type:General  Level of Consciousness: awake and oriented  Airway & Oxygen Therapy: Patient Spontanous Breathing and Patient connected to nasal cannula oxygen  Post-op Assessment: Report given to PACU RN  Post vital signs: Reviewed and stable  Complications: No apparent anesthesia complications

## 2014-05-08 NOTE — Anesthesia Preprocedure Evaluation (Addendum)
Anesthesia Evaluation  Patient identified by MRN, date of birth, ID band Patient awake    Reviewed: Allergy & Precautions, H&P , NPO status , Patient's Chart, lab work & pertinent test results  Airway Mallampati: II TM Distance: >3 FB Neck ROM: full    Dental  (+) Caps, Dental Advisory Given All upper front are capped:   Pulmonary neg pulmonary ROS, former smoker,  breath sounds clear to auscultation  Pulmonary exam normal       Cardiovascular Exercise Tolerance: Good hypertension, Pt. on medications Rhythm:regular Rate:Normal     Neuro/Psych negative neurological ROS  negative psych ROS   GI/Hepatic negative GI ROS, Neg liver ROS, GERD-  Medicated and Controlled,  Endo/Other  negative endocrine ROS  Renal/GU negative Renal ROS  negative genitourinary   Musculoskeletal   Abdominal   Peds  Hematology negative hematology ROS (+)   Anesthesia Other Findings   Reproductive/Obstetrics negative OB ROS                          Anesthesia Physical Anesthesia Plan  ASA: II  Anesthesia Plan: General   Post-op Pain Management:    Induction: Intravenous  Airway Management Planned: LMA  Additional Equipment:   Intra-op Plan:   Post-operative Plan:   Informed Consent: I have reviewed the patients History and Physical, chart, labs and discussed the procedure including the risks, benefits and alternatives for the proposed anesthesia with the patient or authorized representative who has indicated his/her understanding and acceptance.   Dental Advisory Given  Plan Discussed with: CRNA and Surgeon  Anesthesia Plan Comments:         Anesthesia Quick Evaluation

## 2014-05-11 ENCOUNTER — Encounter (HOSPITAL_BASED_OUTPATIENT_CLINIC_OR_DEPARTMENT_OTHER): Payer: Self-pay | Admitting: Urology

## 2014-05-28 ENCOUNTER — Telehealth: Payer: Self-pay | Admitting: *Deleted

## 2014-05-28 NOTE — Telephone Encounter (Signed)
CALLED PATIENT TO REMIND OF APPTS. FOR 05-29-14, LVM FOR A RETURN CALL

## 2014-05-29 ENCOUNTER — Ambulatory Visit
Admission: RE | Admit: 2014-05-29 | Discharge: 2014-05-29 | Disposition: A | Discharge status: Home / Self Care | Payer: Medicare Other | Source: Ambulatory Visit | Attending: Radiation Oncology | Admitting: Radiation Oncology

## 2014-05-29 ENCOUNTER — Encounter: Payer: Self-pay | Admitting: Radiation Oncology

## 2014-05-29 VITALS — BP 139/72 | HR 68 | Resp 16 | Wt 199.0 lb

## 2014-05-29 DIAGNOSIS — Z510 Encounter for antineoplastic radiation therapy: Secondary | ICD-10-CM | POA: Diagnosis not present

## 2014-05-29 DIAGNOSIS — C61 Malignant neoplasm of prostate: Secondary | ICD-10-CM

## 2014-05-29 NOTE — Progress Notes (Signed)
  Radiation Oncology         [phone] ________________________________  Name: Dustin Ramirez. Iannello MRN: [id number]  Date: 05/29/2014  DOB: 04/23/1941  COMPLEX SIMULATION NOTE  NARRATIVE:  The patient was brought to the [facility] today following prostate seed implantation approximately one month ago.  Identity was confirmed.  All relevant records and images related to the planned course of therapy were reviewed.  Then, the patient was set-up supine.  CT images were obtained.  The CT images were loaded into the planning software.  Then the prostate and rectum were contoured.  Treatment planning then occurred.  The implanted iodine 125 seeds were identified by the physics staff for projection of radiation distribution  I have requested : 3D Simulation  I have requested a DVH of the following structures: Prostate and rectum.    ________________________________  Sheral Apley Tammi Klippel, M.D.

## 2014-05-29 NOTE — Progress Notes (Signed)
Reported horrible frequency. Seen by urologist this morning and discovered he isn't completely emptying his bladder thus, patient now has a catheter in place. Reports taking stool softeners to maintain regular bowel movements. Denies pain today. Given rapaflo 8 mg by urologist to start today.

## 2014-05-29 NOTE — Progress Notes (Signed)
Radiation Oncology         [phone] ________________________________  Name: Dustin Ramirez. Dax MRN: [id number]  Date: 05/29/2014  DOB: [birth year]  Follow-Up Visit Note  CC: No PCP Per Patient  [name], MD  Diagnosis:   73 y.o. gentleman with stage T1c adenocarcinoma of the prostate with a Gleason's score of 3+3 and a PSA of 6.2  Interval Since Last Radiation:  3  weeks  Narrative:  The patient returns today for routine follow-up.  He is complaining of increased urinary frequency and urinary hesitation symptoms. He filled out a questionnaire regarding urinary function today providing and overall IPSS score of 27 characterizing his symptoms as severe, and he has required indwelling catheter at present.  ALLERGIES:  is allergic to penicillins.  Meds: Current Outpatient Prescriptions  Medication Sig Dispense Refill  . acetaminophen (TYLENOL) 500 MG tablet Take 500 mg by mouth every 6 (six) hours as needed.      . calcium carbonate (TUMS) 500 MG chewable tablet Chew 1 tablet by mouth as needed for indigestion or heartburn.      . docusate sodium (COLACE) 100 MG capsule Take 100 mg by mouth daily.       [name] HYDROcodone-acetaminophen (NORCO) 10-325 MG per tablet Take 1-2 tablets by mouth every 4 (four) hours as needed for moderate pain. Maximum dose per 24 hours -8 pills.  16 tablet  0  . lisinopril (PRINIVIL,ZESTRIL) 10 MG tablet Take 10 mg by mouth every morning.       . milk thistle 175 MG tablet Take 175 mg by mouth daily.      . Omega-3 Fatty Acids (FISH OIL) 1000 MG CAPS Take 1 capsule by mouth daily.       . silodosin (RAPAFLO) 8 MG CAPS capsule Take 8 mg by mouth daily with breakfast.      . tamsulosin (FLOMAX) 0.4 MG CAPS capsule Take 1 capsule (0.4 mg total) by mouth daily after supper.  30 capsule  0  . sildenafil (VIAGRA) 50 MG tablet Take 50 mg by mouth daily as needed for erectile dysfunction.       No current facility-administered medications for this encounter.     Physical Findings: The patient is in no acute distress. Patient is alert and oriented.  weight is 199 lb (90.266 kg). His blood pressure is 139/72 and his pulse is 68. His respiration is 16. .  No significant changes.  Lab Findings: Lab Results  Component Value Date   WBC 6.4 05/01/2014   HGB 15.5 05/01/2014   HCT 44.3 05/01/2014   MCV 91.3 05/01/2014   PLT 173 05/01/2014    Radiographic Findings:  Patient underwent CT imaging in our clinic for post implant dosimetry. The CT appears to demonstrate an adequate distribution of radioactive seeds throughout the prostate gland. There no seeds in her near the rectum. I suspect the final radiation plan and dosimetry will show appropriate coverage of the prostate gland.   Impression: The patient is recovering from the effects of radiation. His urinary symptoms should gradually improve over the next 4-6 months. We talked about this today.    Plan: Today, I spent time talking to the patient about his prostate seed implant and resolving urinary symptoms. We also talked about long-term follow-up for prostate cancer following seed implant. He understands that ongoing PSA determinations and digital rectal exams will help perform surveillance to rule out disease recurrence. He understands what to expect with his PSA measures. Patient was also  educated today about some of the long-term effects from radiation including a small risk for rectal bleeding and possibly erectile dysfunction. We talked about some of the general management approaches to these potential complications. However, I did encourage the patient to contact our office or return at any point if he has questions or concerns related to his previous radiation and prostate cancer.  _____________________________________  Sheral Apley. Tammi Klippel, M.D.

## 2014-07-16 ENCOUNTER — Encounter: Payer: Self-pay | Admitting: Radiation Oncology

## 2014-07-16 ENCOUNTER — Ambulatory Visit
Admission: RE | Admit: 2014-07-16 | Discharge: 2014-07-16 | Disposition: A | Discharge status: Home / Self Care | Payer: Medicare Other | Source: Ambulatory Visit | Attending: Radiation Oncology | Admitting: Radiation Oncology

## 2014-07-16 DIAGNOSIS — I10 Essential (primary) hypertension: Secondary | ICD-10-CM | POA: Insufficient documentation

## 2014-07-16 DIAGNOSIS — C61 Malignant neoplasm of prostate: Secondary | ICD-10-CM | POA: Diagnosis present

## 2014-08-07 ENCOUNTER — Other Ambulatory Visit: Payer: Self-pay | Admitting: Urology

## 2014-08-12 ENCOUNTER — Encounter (HOSPITAL_BASED_OUTPATIENT_CLINIC_OR_DEPARTMENT_OTHER): Payer: Self-pay | Admitting: *Deleted

## 2014-08-13 ENCOUNTER — Encounter (HOSPITAL_BASED_OUTPATIENT_CLINIC_OR_DEPARTMENT_OTHER): Payer: Self-pay | Admitting: *Deleted

## 2014-08-13 NOTE — Progress Notes (Signed)
NPO AFTER MN. ARRIVE AT 0745. NEEDS ISTAT 8.  CURRENT EKG IN CHART AND EPIC.

## 2014-08-16 NOTE — Anesthesia Preprocedure Evaluation (Addendum)
Anesthesia Evaluation  Patient identified by MRN, date of birth, ID band Patient awake    Reviewed: Allergy & Precautions, H&P , NPO status , Patient's Chart, lab work & pertinent test results  History of Anesthesia Complications Negative for: history of anesthetic complications  Airway Mallampati: III  TM Distance: >3 FB Neck ROM: Full    Dental no notable dental hx. (+) Dental Advisory Given, Teeth Intact, Caps,    Pulmonary former smoker,  breath sounds clear to auscultation  Pulmonary exam normal       Cardiovascular Exercise Tolerance: Good hypertension, Pt. on medications Rhythm:Regular Rate:Normal     Neuro/Psych negative neurological ROS  negative psych ROS   GI/Hepatic Neg liver ROS, GERD-  Medicated and Controlled,  Endo/Other  negative endocrine ROS  Renal/GU negative Renal ROS  negative genitourinary   Musculoskeletal negative musculoskeletal ROS (+)   Abdominal   Peds negative pediatric ROS (+)  Hematology negative hematology ROS (+)   Anesthesia Other Findings Crowns full upper  Reproductive/Obstetrics negative OB ROS                         Anesthesia Physical Anesthesia Plan  ASA: II  Anesthesia Plan: General   Post-op Pain Management:    Induction: Intravenous  Airway Management Planned: LMA  Additional Equipment:   Intra-op Plan:   Post-operative Plan: Extubation in OR  Informed Consent: I have reviewed the patients History and Physical, chart, labs and discussed the procedure including the risks, benefits and alternatives for the proposed anesthesia with the patient or authorized representative who has indicated his/her understanding and acceptance.   Dental advisory given  Plan Discussed with: CRNA  Anesthesia Plan Comments:        Anesthesia Quick Evaluation

## 2014-08-17 ENCOUNTER — Ambulatory Visit (HOSPITAL_BASED_OUTPATIENT_CLINIC_OR_DEPARTMENT_OTHER)
Admission: RE | Admit: 2014-08-17 | Discharge: 2014-08-17 | Disposition: A | Discharge status: Home / Self Care | Payer: Medicare Other | Source: Ambulatory Visit | Attending: Urology | Admitting: Urology

## 2014-08-17 ENCOUNTER — Encounter (HOSPITAL_BASED_OUTPATIENT_CLINIC_OR_DEPARTMENT_OTHER): Payer: Self-pay | Admitting: Anesthesiology

## 2014-08-17 ENCOUNTER — Encounter (HOSPITAL_BASED_OUTPATIENT_CLINIC_OR_DEPARTMENT_OTHER)
Admission: RE | Disposition: A | Discharge status: Home / Self Care | Payer: Self-pay | Source: Ambulatory Visit | Attending: Urology

## 2014-08-17 ENCOUNTER — Ambulatory Visit (HOSPITAL_BASED_OUTPATIENT_CLINIC_OR_DEPARTMENT_OTHER): Payer: Medicare Other | Admitting: Anesthesiology

## 2014-08-17 DIAGNOSIS — R338 Other retention of urine: Secondary | ICD-10-CM | POA: Insufficient documentation

## 2014-08-17 DIAGNOSIS — Z87891 Personal history of nicotine dependence: Secondary | ICD-10-CM | POA: Insufficient documentation

## 2014-08-17 DIAGNOSIS — Z880 Allergy status to penicillin: Secondary | ICD-10-CM | POA: Insufficient documentation

## 2014-08-17 DIAGNOSIS — I10 Essential (primary) hypertension: Secondary | ICD-10-CM | POA: Diagnosis not present

## 2014-08-17 DIAGNOSIS — Z8546 Personal history of malignant neoplasm of prostate: Secondary | ICD-10-CM | POA: Diagnosis not present

## 2014-08-17 DIAGNOSIS — N138 Other obstructive and reflux uropathy: Secondary | ICD-10-CM | POA: Insufficient documentation

## 2014-08-17 DIAGNOSIS — N401 Enlarged prostate with lower urinary tract symptoms: Secondary | ICD-10-CM | POA: Insufficient documentation

## 2014-08-17 DIAGNOSIS — K219 Gastro-esophageal reflux disease without esophagitis: Secondary | ICD-10-CM | POA: Diagnosis not present

## 2014-08-17 DIAGNOSIS — N529 Male erectile dysfunction, unspecified: Secondary | ICD-10-CM | POA: Insufficient documentation

## 2014-08-17 HISTORY — PX: INSERTION OF SUPRAPUBIC CATHETER: SHX5870

## 2014-08-17 HISTORY — PX: CYSTOSCOPY: SHX5120

## 2014-08-17 LAB — POCT I-STAT, CHEM 8
BUN: 12 mg/dL (ref 6–23)
Calcium, Ion: 1.36 mmol/L — ABNORMAL HIGH (ref 1.13–1.30)
Chloride: 104 mEq/L (ref 96–112)
Creatinine, Ser: 0.8 mg/dL (ref 0.50–1.35)
Glucose, Bld: 137 mg/dL — ABNORMAL HIGH (ref 70–99)
HCT: 43 % (ref 39.0–52.0)
Hemoglobin: 14.6 g/dL (ref 13.0–17.0)
Potassium: 4.2 mEq/L (ref 3.7–5.3)
Sodium: 141 mEq/L (ref 137–147)
TCO2: 22 mmol/L (ref 0–100)

## 2014-08-17 SURGERY — INSERTION, SUPRAPUBIC CATHETER
Anesthesia: General | Site: Bladder

## 2014-08-17 MED ORDER — PROPOFOL INFUSION 10 MG/ML OPTIME
INTRAVENOUS | Status: DC | PRN
  Administered 2014-08-17: 50 mL via INTRAVENOUS
  Administered 2014-08-17 (×2): 150 mL via INTRAVENOUS

## 2014-08-17 MED ORDER — OXYCODONE-ACETAMINOPHEN 10-325 MG PO TABS: 1.0000 | ORAL_TABLET | ORAL | Status: DC | PRN

## 2014-08-17 MED ORDER — BUPIVACAINE-EPINEPHRINE (PF) 0.25% -1:200000 IJ SOLN
INTRAMUSCULAR | Status: DC | PRN
  Administered 2014-08-17: 18 mL via PERINEURAL

## 2014-08-17 MED ORDER — OXYCODONE-ACETAMINOPHEN 5-325 MG PO TABS
ORAL_TABLET | ORAL | Status: AC
  Filled 2014-08-17: qty 1

## 2014-08-17 MED ORDER — ONDANSETRON HCL 4 MG/2ML IJ SOLN
INTRAMUSCULAR | Status: DC | PRN
  Administered 2014-08-17: 4 mg via INTRAVENOUS

## 2014-08-17 MED ORDER — DOXYCYCLINE HYCLATE 50 MG PO CAPS: 50.0000 mg | ORAL_CAPSULE | Freq: Two times a day (BID) | ORAL | Status: DC

## 2014-08-17 MED ORDER — FENTANYL CITRATE 0.05 MG/ML IJ SOLN
25.0000 ug | INTRAMUSCULAR | Status: DC | PRN
  Filled 2014-08-17: qty 1

## 2014-08-17 MED ORDER — ONDANSETRON HCL 4 MG/2ML IJ SOLN
4.0000 mg | Freq: Once | INTRAMUSCULAR | Status: DC | PRN
  Filled 2014-08-17: qty 2

## 2014-08-17 MED ORDER — DEXAMETHASONE SODIUM PHOSPHATE 10 MG/ML IJ SOLN
INTRAMUSCULAR | Status: DC | PRN
  Administered 2014-08-17: 8 mg via INTRAVENOUS

## 2014-08-17 MED ORDER — FENTANYL CITRATE 0.05 MG/ML IJ SOLN
INTRAMUSCULAR | Status: DC | PRN
  Administered 2014-08-17 (×2): 50 ug via INTRAVENOUS

## 2014-08-17 MED ORDER — GENTAMICIN SULFATE 40 MG/ML IJ SOLN
5.0000 mg/kg | Freq: Once | INTRAVENOUS | Status: AC
  Administered 2014-08-17: 450 mg via INTRAVENOUS
  Filled 2014-08-17: qty 11.25

## 2014-08-17 MED ORDER — STERILE WATER FOR IRRIGATION IR SOLN
Status: DC | PRN
  Administered 2014-08-17: 3000 mL

## 2014-08-17 MED ORDER — OXYCODONE-ACETAMINOPHEN 5-325 MG PO TABS
1.0000 | ORAL_TABLET | ORAL | Status: DC | PRN
Start: 2014-08-17 — End: 2014-08-17
  Administered 2014-08-17: 1 via ORAL
  Filled 2014-08-17: qty 2

## 2014-08-17 MED ORDER — LIDOCAINE HCL (CARDIAC) 20 MG/ML IV SOLN
INTRAVENOUS | Status: DC | PRN
  Administered 2014-08-17: 80 mg via INTRAVENOUS

## 2014-08-17 MED ORDER — MIDAZOLAM HCL 2 MG/2ML IJ SOLN
INTRAMUSCULAR | Status: AC
  Filled 2014-08-17: qty 2

## 2014-08-17 MED ORDER — FENTANYL CITRATE 0.05 MG/ML IJ SOLN
INTRAMUSCULAR | Status: AC
  Filled 2014-08-17: qty 4

## 2014-08-17 MED ORDER — MIDAZOLAM HCL 5 MG/5ML IJ SOLN
INTRAMUSCULAR | Status: DC | PRN
  Administered 2014-08-17: 2 mg via INTRAVENOUS

## 2014-08-17 MED ORDER — VANCOMYCIN HCL IN DEXTROSE 1-5 GM/200ML-% IV SOLN
1000.0000 mg | Freq: Once | INTRAVENOUS | Status: AC
  Administered 2014-08-17: 1000 mg via INTRAVENOUS
  Filled 2014-08-17: qty 200

## 2014-08-17 MED ORDER — LACTATED RINGERS IV SOLN
INTRAVENOUS | Status: DC
  Administered 2014-08-17 (×2): via INTRAVENOUS
  Filled 2014-08-17: qty 1000

## 2014-08-17 SURGICAL SUPPLY — 45 items
ADAPTER CATH URET PLST 4-6FR (CATHETERS) IMPLANT
BAG DRAIN URO-CYSTO SKYTR STRL (DRAIN) ×2 IMPLANT
BAG URINE DRAINAGE (UROLOGICAL SUPPLIES) IMPLANT
BAG URINE LEG 19OZ MD ST LTX (BAG) ×2 IMPLANT
BLADE CLIPPER SURG (BLADE) IMPLANT
BLADE SURG 15 STRL LF DISP TIS (BLADE) ×1 IMPLANT
BLADE SURG 15 STRL SS (BLADE) ×1
BRUSH URET BIOPSY 3F (UROLOGICAL SUPPLIES) IMPLANT
CANISTER SUCT LVC 12 LTR MEDI- (MISCELLANEOUS) ×2 IMPLANT
CATH BONANNO SUPRAPUBIC 14G (CATHETERS) IMPLANT
CATH FOLEY 2WAY SLVR  5CC 16FR (CATHETERS) ×1
CATH FOLEY 2WAY SLVR  5CC 18FR (CATHETERS) ×1
CATH FOLEY 2WAY SLVR 5CC 16FR (CATHETERS) ×1 IMPLANT
CATH FOLEY 2WAY SLVR 5CC 18FR (CATHETERS) ×1 IMPLANT
CATH FOLEY INTRO SUPRA 16F (CATHETERS) ×2 IMPLANT
CATH INTERMIT  6FR 70CM (CATHETERS) IMPLANT
CATH URET 5FR 28IN CONE TIP (BALLOONS)
CATH URET 5FR 28IN OPEN ENDED (CATHETERS) IMPLANT
CATH URET 5FR 70CM CONE TIP (BALLOONS) IMPLANT
CLOTH BEACON ORANGE TIMEOUT ST (SAFETY) ×2 IMPLANT
DRAPE CAMERA CLOSED 9X96 (DRAPES) ×2 IMPLANT
ELECT REM PT RETURN 9FT ADLT (ELECTROSURGICAL)
ELECTRODE REM PT RTRN 9FT ADLT (ELECTROSURGICAL) IMPLANT
GLOVE BIO SURGEON STRL SZ8 (GLOVE) ×2 IMPLANT
GLOVE BIOGEL PI IND STRL 7.5 (GLOVE) ×2 IMPLANT
GLOVE BIOGEL PI INDICATOR 7.5 (GLOVE) ×2
GLOVE SURG SS PI 7.5 STRL IVOR (GLOVE) ×2 IMPLANT
GOWN STRL REIN XL XLG (GOWN DISPOSABLE) IMPLANT
GOWN STRL REUS W/TWL XL LVL3 (GOWN DISPOSABLE) ×4 IMPLANT
GUIDEWIRE 0.038 PTFE COATED (WIRE) ×2 IMPLANT
GUIDEWIRE ANG ZIPWIRE 038X150 (WIRE) IMPLANT
GUIDEWIRE STR DUAL SENSOR (WIRE) IMPLANT
HOLDER FOLEY CATH W/STRAP (MISCELLANEOUS) IMPLANT
IV NS IRRIG 3000ML ARTHROMATIC (IV SOLUTION) IMPLANT
KIT SUPRAPUBIC CATH (MISCELLANEOUS) IMPLANT
NEEDLE HYPO 25X1 1.5 SAFETY (NEEDLE) ×2 IMPLANT
NEEDLE HYPO 25X5/8 SAFETYGLIDE (NEEDLE) ×4 IMPLANT
PACK CYSTO (CUSTOM PROCEDURE TRAY) ×2 IMPLANT
PENCIL BUTTON HOLSTER BLD 10FT (ELECTRODE) IMPLANT
SPONGE GAUZE 4X4 12PLY STER LF (GAUZE/BANDAGES/DRESSINGS) ×2 IMPLANT
SUT SILK 0 TIES 10X30 (SUTURE) ×2 IMPLANT
SUT SILK 2 0 FS (SUTURE) IMPLANT
SYR CONTROL 10ML LL (SYRINGE) ×2 IMPLANT
SYRINGE IRR TOOMEY STRL 70CC (SYRINGE) IMPLANT
WATER STERILE IRR 3000ML UROMA (IV SOLUTION) ×2 IMPLANT

## 2014-08-17 NOTE — Transfer of Care (Signed)
Immediate Anesthesia Transfer of Care Note  Patient: Dustin Ramirez. Judeth Horn  Procedure(s) Performed: Procedure(s): INSERTION OF SUPRAPUBIC CATHETER (N/A) CYSTOSCOPY (N/A)  Patient Location: PACU  Anesthesia Type:General  Level of Consciousness: awake, alert , oriented and patient cooperative  Airway & Oxygen Therapy: Patient Spontanous Breathing and Patient connected to nasal cannula oxygen  Post-op Assessment: Report given to PACU RN and Post -op Vital signs reviewed and stable  Post vital signs: Reviewed and stable  Complications: No apparent anesthesia complications

## 2014-08-17 NOTE — Op Note (Signed)
PATIENT:  Dustin Ramirez  PRE-OPERATIVE DIAGNOSIS: urinary retention  POST-OPERATIVE DIAGNOSIS: Same  PROCEDURE: 1. Cystoscopy 2. Suprapubic tube placement  SURGEON:  [name]  INDICATION: Dustin Ramirez is a 73 year old male with a history of BPH and adenocarcinoma of the prostate treated with I-125 seed implantation on 05/08/14. He developed urinary retention and failed multiple voiding trials despite maximal medical management. When his catheter was placed in 9/15 he was found to have 1250 mL in his bladder. He underwent urodynamics which revealed large capacity bladder that does generate an adequate detrusor contraction but he was unable to urinate despite generating pressures up to 37 cm H2O. We discussed the treatment options and he has elected to proceed with suprapubic tube at this time with hope that over time he will begin to void spontaneously. He was placed on preoperative tetracycline and received vancomycin and gentamicin IV preop.  ANESTHESIA:  General  EBL:  Minimal  DRAINS: 55 French Foley as a suprapubic tube  LOCAL MEDICATIONS USED:  18 mL of quarter percent Marcaine with epinephrine  SPECIMEN:  none  Description of procedure: After informed consent the patient was taken to the operating room and placed on the table in a supine position. General anesthesia was then administered. Once fully anesthetized the patient was moved to the dorsal lithotomy position and the genitalia were sterilely prepped and draped in standard fashion. An official timeout was then performed.  The 86 French cystoscope with 12 lens was passed under direct vision down the urethra which is noted be normal. The prostatic urethra revealed elongation with bilobar hypertrophy. He did not have a significant median lobe component but had a slightly elevated bladder neck. The bladder was entered and fully inspected. There were no tumors stones or worrisome inflammatory lesions. There was some mild  irritation on the posterior wall of the bladder secondary to his Foley catheter.  I filled his bladder to capacity and with the 70 lens palpated approximately 3 finger breaths appeared to the symphysis pubis and could see this was at the location of the dome of his bladder. I therefore infiltrated the skin and subcutaneous tissue with local anesthetic and then made a midline incision proximally centimeter in length. The trocar and suprapubic tube introducer was then passed through the incision and with the bladder filled to capacity and the patient in Trendelenburg position the bladder was punctured with the trocar and the sheath was left in place and the trocar removed. The 50 French Foley catheter was then passed through the sheath and the peel-away sheath was removed. I filled the balloon with 10 mL of water and secured the suprapubic tube to the skin with 2-0 silk suture. The suprapubic tube was then connected to closed system drainage and the patient was awakened and taken recovery room in stable and satisfactory condition. He tolerated procedure well no intraoperative complications.  PLAN OF CARE: Discharge to home after PACU  PATIENT DISPOSITION:  PACU - hemodynamically stable.

## 2014-08-17 NOTE — Discharge Instructions (Signed)
Suprapubic Catheter Home Guide A suprapubic catheter is a rubber tube with a tiny balloon on the end. It is used to drain urine from the bladder. This catheter is put in your bladder through a small opening in the lower center part of your abdomen. Suprapubic refers to the area right above your pubic bone. The balloon on the end of the catheter is filled with germ-free (sterile) water. This keeps the catheter from slipping out. When the catheter is in place, your urine will drain into a collection bag. The bag can be put beside your bed at night or attached to your leg during the day. HOW TO CARE FOR YOUR CATHETER  Cleaning your skin  Clean the skin around the catheter opening every day.  Wash your hands with soap and water.  Clean the skin around the opening with a clean washcloth and soapy water. Do not pull on the tube.  Pat the area dry with a clean towel.  Your caregiver may want you to put a bandage (dressing) over the site. Do not use ointment on this area unless your caregiver tells you to. Cleaning the catheter  Ask your caregiver if you need to clean the catheter and how often.  Use only soap and water.  There may be crusts on the catheter. Put hydrogen peroxide on a cotton ball or gauze pad to remove any crust. Emptying the collection bag  You may have a large drainage bag to use at night and a smaller one for daytime. Empty the large bag every 8 hours. Empty the small bag when it is about  full.  Keep the drainage bag below the level of the catheter. This keeps urine from flowing backwards.  Hold the bag over the toilet or another container. Release the valve (spigot) at the bottom of the bag. Do not touch the opening of the spigot. Do not let the opening touch the toilet or container.  Close the spigot tightly when the bag is empty. Cleaning the collection bag  Clean the bag every few days.  First, wash your hands.  Disconnect the tubing from the catheter. Replace  the used bag with a new bag. Then you can clean the used one.  Empty the used bag completely. Rinse it out with warm water and soap or fill the bag with water and add 1 teaspoon of vinegar. Let it sit for about 30 minutes. Then drain. cking everything  The bag should be completely dry before storing it. Put it inside a plastic bag to keep it clean. SEEK MEDICAL CARE IF:  You leak urine.  Your skin around the catheter becomes red or sore.  Your urine flow slows down.  Your urine gets cloudy or smelly. SEEK IMMEDIATE MEDICAL CARE IF:   You have chills, nausea, or back pain.  You have trouble changing your catheter.  Your catheter comes out.  You have blood in your urine.  You have no urine flow for 1 hour.  You have a fever.   Post Anesthesia Home Care Instructions  Activity: Get plenty of rest for the remainder of the day. A responsible adult should stay with you for 24 hours following the procedure.  For the next 24 hours, DO NOT: -Drive a car -Paediatric nurse -Drink alcoholic beverages -Take any medication unless instructed by your physician -Make any legal decisions or sign important papers.  Meals: Start with liquid foods such as gelatin or soup. Progress to regular foods as tolerated. Avoid greasy, spicy,  heavy foods. If nausea and/or vomiting occur, drink only clear liquids until the nausea and/or vomiting subsides. Call your physician if vomiting continues.  Special Instructions/Symptoms: Your throat may feel dry or sore from the anesthesia or the breathing tube placed in your throat during surgery. If this causes discomfort, gargle with warm salt water. The discomfort should disappear within 24 hours.

## 2014-08-17 NOTE — Anesthesia Procedure Notes (Signed)
Procedure Name: LMA Insertion Date/Time: 08/17/2014 9:35 AM Performed by: Wanita Chamberlain Pre-anesthesia Checklist: Patient identified, Timeout performed, Emergency Drugs available, Suction available and Patient being monitored Patient Re-evaluated:Patient Re-evaluated prior to inductionOxygen Delivery Method: Circle system utilized Preoxygenation: Pre-oxygenation with 100% oxygen Intubation Type: IV induction Ventilation: Mask ventilation without difficulty LMA: LMA inserted LMA Size: 4.0 Number of attempts: 1 Airway Equipment and Method: Bite block Placement Confirmation: positive ETCO2 Tube secured with: Tape Dental Injury: Teeth and Oropharynx as per pre-operative assessment

## 2014-08-17 NOTE — Anesthesia Postprocedure Evaluation (Signed)
  Anesthesia Post-op Note  Patient: Dustin Ramirez. Michna  Procedure(s) Performed: Procedure(s) (LRB): INSERTION OF SUPRAPUBIC CATHETER (N/A) CYSTOSCOPY (N/A)  Patient Location: PACU  Anesthesia Type: General  Level of Consciousness: awake and alert   Airway and Oxygen Therapy: Patient Spontanous Breathing  Post-op Pain: mild  Post-op Assessment: Post-op Vital signs reviewed, Patient's Cardiovascular Status Stable, Respiratory Function Stable, Patent Airway and No signs of Nausea or vomiting  Last Vitals:  Filed Vitals:   08/17/14 1115  BP: 130/70  Pulse: 69  Temp:   Resp: 23    Post-op Vital Signs: stable   Complications: No apparent anesthesia complications

## 2014-08-17 NOTE — H&P (Signed)
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 There is no family history of prostate cancer.    PSAs:  12/12 - 2.9  4/15 - 6.2  TRUS/BX 02/17/14: Prostate volume - 23 cc with no abnormality noted on ultrasound.  Pathology: Adenocarcinoma Gleason 3+3 = 6 in both lobes 5/12 cores positive.   Treatment: I-125 seed implant 05/08/14.    Erectile dysfunction: This has been managed with Viagra 50 mg.    Interval history: He developed urinary retention and failed voiding trials. When a catheter was placed on 06/10/14 he was found to have 1250 cc in the bladder.   Past Medical History Problems  1. History of hypertension (Z86.79)  Surgical History Problems  1. History of Biopsy Of The Prostate Needle 2. History of No Surgical Problems 3. History of Surgery Prostate Transperineal Placement Of Needles  Current Meds 1. CefTRIAXone Sodium 1 GM Injection Solution Reconstituted; INJECT 1  GM  Intramuscular To be done befor procedure; To Be Done: [id number]; Status: HOLD FOR  - Administration Ordered 2. Fish Oil 1000 MG Oral Capsule;  Therapy: (Recorded:30Apr2015) to Recorded 3. Lisinopril 10 MG Oral Tablet;  Therapy: (Recorded:30Apr2015) to Recorded 4. Milk Thistle CAPS;  Therapy: (Recorded:30Apr2015) to Recorded 5. SMZ-TMP DS 800-160 MG Oral Tablet; Take 1 tablet twice daily;  Therapy: 15Sep2015 to (Last Rx:15Sep2015)  Requested for: 15Sep2015 Ordered 6. Stool Softener TABS;  Therapy: (Recorded:03Nov2015) to Recorded 7. Sulfamethoxazole-TMP DS 800-160 MG TABS; TAKE 1 TABLET TWICE DAILY;  Therapy: 29Sep2015 to (Evaluate:09Oct2015)  Requested for: 29Sep2015; Last  Rx:29Sep2015 Ordered  Allergies Medication  1. Penicillins  Family History Problems  1. Family history of Colon cancer : Mother 2. Family history of diabetes mellitus (Z83.3) : Brother 3. Family history of lung cancer (Z80.1)  : Father  Social History Problems  1. Alcohol use (F10.99)   2 glasses of wine at night 2. Caffeine use (F15.90)   2 coffees in the morning 3. Father deceased 80. Former smoker [phone] 5. Married 6. Mother deceased 78. Number of children   1 daughter; 1 son  Vitals Vital Signs  Height: 5 ft 11 in Weight: 194 lb  BMI Calculated: 27.06 BSA Calculated: 2.08 Blood Pressure: 157 / 76 Heart Rate: 84  Review of Systems Genitourinary, constitutional, skin, eye, otolaryngeal, hematologic/lymphatic, cardiovascular, pulmonary, endocrine, musculoskeletal, gastrointestinal, neurological and psychiatric system(s) were reviewed and pertinent findings if present are noted.  Genitourinary: urinary frequency and erectile dysfunction.  Gastrointestinal: heartburn.  Eyes: blurred vision.    Physical Exam Constitutional: Well nourished and well developed . No acute distress.  ENT:. The ears and nose are normal in appearance.  Neck: The appearance of the neck is normal and no neck mass is present.  Pulmonary: No respiratory distress and normal respiratory rhythm and effort.  Cardiovascular: Heart rate and rhythm are normal . No peripheral edema.  Abdomen: The abdomen is soft and nontender. No masses are palpated. No CVA tenderness. No hernias are palpable. No hepatosplenomegaly noted.  Rectal: Rectal exam demonstrates normal sphincter tone, no tenderness and no masses. Estimated prostate size is 1+. There is obliteration of the median sulcus. The prostate has no nodularity and is not tender. The left seminal vesicle is nonpalpable. The right seminal vesicle is nonpalpable. The perineum is normal on inspection.  Genitourinary: Examination of the penis demonstrates no discharge, no masses, no lesions and a normal meatus. The scrotum is without  lesions. The right epididymis is palpably normal and non-tender. The left epididymis is palpably normal and non-tender. The right testis is non-tender and  without masses. The left testis is non-tender and without masses.  Lymphatics: The femoral and inguinal nodes are not enlarged or tender.  Skin: Normal skin turgor, no visible rash and no visible skin lesions.  Neuro/Psych:. Mood and affect are appropriate.    Results/Data Urodynamics 07/28/14: Study was performed to evaluate urinary retention.  His full urodynamic study was reviewed in its entirety as noted in the chart. CMG: He was found to have a maximum cystometric capacity of 730 cc with a first sensation occurring at 434 cc. Instability was noted with a contraction occurring at 396 cc with an unstable contraction pressure of 35 cm H2O with no leakage.  Pressure-flow: He was able to generate a voluntary contracture and generated pressure up to 37 cm H2O but only voided a few drops.  EMG: Normal  Fluoroscopy: Trabeculation of the bladder was noted.    Impression: He has a large cystometric capacity and although he does generate an adequate detrusor contraction voluntarily he has outlet obstruction. He also has some instability which is secondary. The problem is that he has had previous prostatic radiation and therefore outlet reduction surgery carries an increased risk of stress incontinence.     Assessment  He does have a large capacity bladder but it does squeeze down sufficiently. The problem is he has outlet obstruction and has recently undergone radioactive seed implantation. I told him that I cannot perform a transurethral resection of his prostate is soon after seed implantation and when I can do a resection the effects of radiation on his external sphincter will place him at a much greater risk of incontinence. In the meantime the options would be to maintain his indwelling urethral Foley catheter versus a suprapubic tube which would allow intermittent voiding trials and could be removed once he starts voiding spontaneously again if that occurs. I told him that in my opinion the  best method to manage his bladder would be intermittent self-catheterization although he is pretty sure he would not be able to perform something like this. He elected to proceed with suprapubic tube placement.  Preoperative urine culture done on 07/28/14 grew staph that was sensitive to gentamicin, nitrofurantoin, vancomycin and tetracycline.  Plan: 1.  He has been maintained on doxycycline preoperatively. 2.  He is scheduled for cystoscopy and suprapubic tube placement.

## 2014-08-18 ENCOUNTER — Encounter (HOSPITAL_BASED_OUTPATIENT_CLINIC_OR_DEPARTMENT_OTHER): Payer: Self-pay | Admitting: Urology

## 2014-08-27 ENCOUNTER — Telehealth: Payer: Self-pay | Admitting: Medical

## 2014-08-27 NOTE — Telephone Encounter (Signed)
Pt sleep apnea form was in my records to review for months. I kept it there thinking eventually I would see patient. Reviewed again and saw that [facility] family physician is his pcp. So I likely saw patient at [facility] and the form was sent to me here. So I will ask staff here to fax the sleep apnea to [facility]. Make it to the attention of Dr. Cathi Roan.

## 2014-09-15 NOTE — Telephone Encounter (Signed)
Still awaiting form/do not see it scanned/

## 2014-09-21 NOTE — Progress Notes (Signed)
  Radiation Oncology         [phone] ________________________________  Name: Rosana Hoes. Parco MRN: [id number]  Date: 07/16/2014  DOB: 1941/05/04  3D Planning Note   Prostate Brachytherapy Post-Implant Dosimetry  Diagnosis: 73 y.o. gentleman with stage T1c adenocarcinoma of the prostate with a Gleason's score of 3+3 and a PSA of 6.2  Narrative: On a previous date, Isami K. Vanvorst returned following prostate seed implantation for post implant planning. He underwent CT scan complex simulation to delineate the three-dimensional structures of the pelvis and demonstrate the radiation distribution.  Since that time, the seed localization, and complex isodose planning with dose volume histograms have now been completed.  Results:   Prostate Coverage - The dose of radiation delivered to the 90% or more of the prostate gland (D90) was 109.9% of the prescription dose. This exceeds our goal of greater than 90%. Rectal Sparing - The volume of rectal tissue receiving the prescription dose or higher was 0.02 cc. This falls under our thresholds tolerance of 1.0 cc.  Impression: The prostate seed implant appears to show adequate target coverage and appropriate rectal sparing.  Plan:  The patient will continue to follow with urology for ongoing PSA determinations. I would anticipate a high likelihood for local tumor control with minimal risk for rectal morbidity.  ________________________________  Sheral Apley Tammi Klippel, M.D.

## 2014-10-13 DIAGNOSIS — R339 Retention of urine, unspecified: Secondary | ICD-10-CM | POA: Diagnosis not present

## 2014-11-17 DIAGNOSIS — R339 Retention of urine, unspecified: Secondary | ICD-10-CM | POA: Diagnosis not present

## 2014-12-17 DIAGNOSIS — R339 Retention of urine, unspecified: Secondary | ICD-10-CM | POA: Diagnosis not present

## 2015-01-04 DIAGNOSIS — H25813 Combined forms of age-related cataract, bilateral: Secondary | ICD-10-CM | POA: Diagnosis not present

## 2015-01-18 DIAGNOSIS — N401 Enlarged prostate with lower urinary tract symptoms: Secondary | ICD-10-CM | POA: Diagnosis not present

## 2015-01-18 DIAGNOSIS — R339 Retention of urine, unspecified: Secondary | ICD-10-CM | POA: Diagnosis not present

## 2015-02-08 DIAGNOSIS — R339 Retention of urine, unspecified: Secondary | ICD-10-CM | POA: Diagnosis not present

## 2015-02-08 DIAGNOSIS — C61 Malignant neoplasm of prostate: Secondary | ICD-10-CM | POA: Diagnosis not present

## 2015-02-08 DIAGNOSIS — R338 Other retention of urine: Secondary | ICD-10-CM | POA: Diagnosis not present

## 2015-02-08 DIAGNOSIS — N401 Enlarged prostate with lower urinary tract symptoms: Secondary | ICD-10-CM | POA: Diagnosis not present

## 2015-02-12 DIAGNOSIS — H25811 Combined forms of age-related cataract, right eye: Secondary | ICD-10-CM | POA: Diagnosis not present

## 2015-02-23 DIAGNOSIS — Z79899 Other long term (current) drug therapy: Secondary | ICD-10-CM | POA: Diagnosis not present

## 2015-02-23 DIAGNOSIS — Z87891 Personal history of nicotine dependence: Secondary | ICD-10-CM | POA: Diagnosis not present

## 2015-02-23 DIAGNOSIS — I10 Essential (primary) hypertension: Secondary | ICD-10-CM | POA: Diagnosis not present

## 2015-02-23 DIAGNOSIS — H25811 Combined forms of age-related cataract, right eye: Secondary | ICD-10-CM | POA: Diagnosis not present

## 2015-02-23 DIAGNOSIS — H259 Unspecified age-related cataract: Secondary | ICD-10-CM | POA: Diagnosis not present

## 2015-02-24 DIAGNOSIS — K529 Noninfective gastroenteritis and colitis, unspecified: Secondary | ICD-10-CM | POA: Diagnosis not present

## 2015-02-24 DIAGNOSIS — E860 Dehydration: Secondary | ICD-10-CM | POA: Diagnosis not present

## 2015-02-25 DIAGNOSIS — A045 Campylobacter enteritis: Secondary | ICD-10-CM | POA: Diagnosis not present

## 2015-02-25 DIAGNOSIS — N390 Urinary tract infection, site not specified: Secondary | ICD-10-CM | POA: Diagnosis not present

## 2015-02-25 DIAGNOSIS — R197 Diarrhea, unspecified: Secondary | ICD-10-CM | POA: Diagnosis not present

## 2015-02-25 DIAGNOSIS — R531 Weakness: Secondary | ICD-10-CM | POA: Diagnosis not present

## 2015-02-25 DIAGNOSIS — E860 Dehydration: Secondary | ICD-10-CM | POA: Diagnosis not present

## 2015-03-04 DIAGNOSIS — R338 Other retention of urine: Secondary | ICD-10-CM | POA: Diagnosis not present

## 2015-03-04 DIAGNOSIS — R339 Retention of urine, unspecified: Secondary | ICD-10-CM | POA: Diagnosis not present

## 2015-03-04 DIAGNOSIS — N401 Enlarged prostate with lower urinary tract symptoms: Secondary | ICD-10-CM | POA: Diagnosis not present

## 2015-03-31 DIAGNOSIS — R339 Retention of urine, unspecified: Secondary | ICD-10-CM | POA: Diagnosis not present

## 2015-04-26 DIAGNOSIS — R339 Retention of urine, unspecified: Secondary | ICD-10-CM | POA: Diagnosis not present

## 2015-05-11 DIAGNOSIS — Z87891 Personal history of nicotine dependence: Secondary | ICD-10-CM | POA: Diagnosis not present

## 2015-05-11 DIAGNOSIS — H25812 Combined forms of age-related cataract, left eye: Secondary | ICD-10-CM | POA: Diagnosis not present

## 2015-05-11 DIAGNOSIS — Z79899 Other long term (current) drug therapy: Secondary | ICD-10-CM | POA: Diagnosis not present

## 2015-05-11 DIAGNOSIS — I10 Essential (primary) hypertension: Secondary | ICD-10-CM | POA: Diagnosis not present

## 2015-05-11 DIAGNOSIS — H269 Unspecified cataract: Secondary | ICD-10-CM | POA: Diagnosis not present

## 2015-05-19 DIAGNOSIS — N401 Enlarged prostate with lower urinary tract symptoms: Secondary | ICD-10-CM | POA: Diagnosis not present

## 2015-05-19 DIAGNOSIS — R339 Retention of urine, unspecified: Secondary | ICD-10-CM | POA: Diagnosis not present

## 2015-06-18 DIAGNOSIS — R339 Retention of urine, unspecified: Secondary | ICD-10-CM | POA: Diagnosis not present

## 2015-07-14 DIAGNOSIS — R339 Retention of urine, unspecified: Secondary | ICD-10-CM | POA: Diagnosis not present

## 2015-07-15 DIAGNOSIS — Z23 Encounter for immunization: Secondary | ICD-10-CM | POA: Diagnosis not present

## 2015-07-15 DIAGNOSIS — Z9181 History of falling: Secondary | ICD-10-CM | POA: Diagnosis not present

## 2015-07-15 DIAGNOSIS — Z0000 Encounter for general adult medical examination without abnormal findings: Secondary | ICD-10-CM | POA: Diagnosis not present

## 2015-07-15 DIAGNOSIS — Z79899 Other long term (current) drug therapy: Secondary | ICD-10-CM | POA: Diagnosis not present

## 2015-07-15 DIAGNOSIS — Z1389 Encounter for screening for other disorder: Secondary | ICD-10-CM | POA: Diagnosis not present

## 2015-07-15 DIAGNOSIS — I10 Essential (primary) hypertension: Secondary | ICD-10-CM | POA: Diagnosis not present

## 2015-07-29 DIAGNOSIS — R350 Frequency of micturition: Secondary | ICD-10-CM | POA: Diagnosis not present

## 2015-08-12 DIAGNOSIS — C61 Malignant neoplasm of prostate: Secondary | ICD-10-CM | POA: Diagnosis not present

## 2015-08-20 DIAGNOSIS — R338 Other retention of urine: Secondary | ICD-10-CM | POA: Diagnosis not present

## 2015-08-20 DIAGNOSIS — Z9350 Unspecified cystostomy status: Secondary | ICD-10-CM | POA: Diagnosis not present

## 2015-08-20 DIAGNOSIS — N401 Enlarged prostate with lower urinary tract symptoms: Secondary | ICD-10-CM | POA: Diagnosis not present

## 2015-08-23 ENCOUNTER — Other Ambulatory Visit: Payer: Self-pay | Admitting: Urology

## 2015-09-02 ENCOUNTER — Encounter (HOSPITAL_BASED_OUTPATIENT_CLINIC_OR_DEPARTMENT_OTHER): Payer: Self-pay | Admitting: *Deleted

## 2015-09-02 NOTE — Progress Notes (Signed)
NPO AFTER MN. ARRIVE AT 0815. NEEDS ISTAT AND EKG.  

## 2015-09-06 ENCOUNTER — Ambulatory Visit (HOSPITAL_BASED_OUTPATIENT_CLINIC_OR_DEPARTMENT_OTHER): Payer: Commercial Managed Care - HMO | Admitting: Anesthesiology

## 2015-09-06 ENCOUNTER — Encounter (HOSPITAL_BASED_OUTPATIENT_CLINIC_OR_DEPARTMENT_OTHER): Payer: Self-pay | Admitting: Anesthesiology

## 2015-09-06 ENCOUNTER — Ambulatory Visit (HOSPITAL_BASED_OUTPATIENT_CLINIC_OR_DEPARTMENT_OTHER)
Admission: RE | Admit: 2015-09-06 | Discharge: 2015-09-06 | Disposition: A | Discharge status: Home / Self Care | Payer: Commercial Managed Care - HMO | Source: Ambulatory Visit | Attending: Urology | Admitting: Urology

## 2015-09-06 ENCOUNTER — Encounter (HOSPITAL_BASED_OUTPATIENT_CLINIC_OR_DEPARTMENT_OTHER)
Admission: RE | Disposition: A | Discharge status: Home / Self Care | Payer: Self-pay | Source: Ambulatory Visit | Attending: Urology

## 2015-09-06 DIAGNOSIS — Z87891 Personal history of nicotine dependence: Secondary | ICD-10-CM | POA: Insufficient documentation

## 2015-09-06 DIAGNOSIS — N138 Other obstructive and reflux uropathy: Secondary | ICD-10-CM | POA: Insufficient documentation

## 2015-09-06 DIAGNOSIS — K219 Gastro-esophageal reflux disease without esophagitis: Secondary | ICD-10-CM | POA: Insufficient documentation

## 2015-09-06 DIAGNOSIS — N401 Enlarged prostate with lower urinary tract symptoms: Secondary | ICD-10-CM | POA: Diagnosis not present

## 2015-09-06 DIAGNOSIS — N400 Enlarged prostate without lower urinary tract symptoms: Secondary | ICD-10-CM | POA: Diagnosis not present

## 2015-09-06 DIAGNOSIS — I10 Essential (primary) hypertension: Secondary | ICD-10-CM | POA: Insufficient documentation

## 2015-09-06 DIAGNOSIS — N529 Male erectile dysfunction, unspecified: Secondary | ICD-10-CM | POA: Insufficient documentation

## 2015-09-06 DIAGNOSIS — R338 Other retention of urine: Secondary | ICD-10-CM | POA: Diagnosis not present

## 2015-09-06 DIAGNOSIS — Z79899 Other long term (current) drug therapy: Secondary | ICD-10-CM | POA: Insufficient documentation

## 2015-09-06 DIAGNOSIS — R339 Retention of urine, unspecified: Secondary | ICD-10-CM | POA: Diagnosis present

## 2015-09-06 HISTORY — PX: TRANSURETHRAL RESECTION OF PROSTATE: SHX73

## 2015-09-06 LAB — POCT I-STAT 4, (NA,K, GLUC, HGB,HCT)
Glucose, Bld: 148 mg/dL — ABNORMAL HIGH (ref 65–99)
HCT: 41 % (ref 39.0–52.0)
Hemoglobin: 13.9 g/dL (ref 13.0–17.0)
Potassium: 4.2 mmol/L (ref 3.5–5.1)
Sodium: 139 mmol/L (ref 135–145)

## 2015-09-06 SURGERY — TRANSURETHRAL RESECTION OF THE PROSTATE WITH GYRUS INSTRUMENTS
Anesthesia: General | Site: Prostate

## 2015-09-06 MED ORDER — FENTANYL CITRATE (PF) 100 MCG/2ML IJ SOLN
INTRAMUSCULAR | Status: DC | PRN
  Administered 2015-09-06 (×3): 50 ug via INTRAVENOUS

## 2015-09-06 MED ORDER — ONDANSETRON HCL 4 MG/2ML IJ SOLN
INTRAMUSCULAR | Status: AC
  Filled 2015-09-06: qty 2

## 2015-09-06 MED ORDER — OXYCODONE HCL 10 MG PO TABS: 10.0000 mg | ORAL_TABLET | ORAL | Status: DC | PRN

## 2015-09-06 MED ORDER — CIPROFLOXACIN IN D5W 400 MG/200ML IV SOLN
INTRAVENOUS | Status: DC | PRN
  Administered 2015-09-06: 400 mg via INTRAVENOUS

## 2015-09-06 MED ORDER — CIPROFLOXACIN IN D5W 400 MG/200ML IV SOLN
INTRAVENOUS | Status: AC
  Filled 2015-09-06: qty 200

## 2015-09-06 MED ORDER — ACETAMINOPHEN 160 MG/5ML PO SOLN
325.0000 mg | ORAL | Status: DC | PRN
  Filled 2015-09-06: qty 20.3

## 2015-09-06 MED ORDER — LACTATED RINGERS IV SOLN
INTRAVENOUS | Status: DC
  Administered 2015-09-06 (×2): via INTRAVENOUS
  Filled 2015-09-06: qty 1000

## 2015-09-06 MED ORDER — DEXAMETHASONE SODIUM PHOSPHATE 10 MG/ML IJ SOLN
INTRAMUSCULAR | Status: AC
  Filled 2015-09-06: qty 1

## 2015-09-06 MED ORDER — PHENAZOPYRIDINE HCL 200 MG PO TABS
200.0000 mg | ORAL_TABLET | Freq: Once | ORAL | Status: AC
  Administered 2015-09-06: 200 mg via ORAL
  Filled 2015-09-06: qty 1

## 2015-09-06 MED ORDER — PROPOFOL 10 MG/ML IV BOLUS
INTRAVENOUS | Status: AC
  Filled 2015-09-06: qty 20

## 2015-09-06 MED ORDER — FENTANYL CITRATE (PF) 100 MCG/2ML IJ SOLN
INTRAMUSCULAR | Status: AC
  Filled 2015-09-06: qty 2

## 2015-09-06 MED ORDER — PHENAZOPYRIDINE HCL 200 MG PO TABS: 200.0000 mg | ORAL_TABLET | Freq: Three times a day (TID) | ORAL | Status: DC | PRN

## 2015-09-06 MED ORDER — MIDAZOLAM HCL 2 MG/2ML IJ SOLN
INTRAMUSCULAR | Status: AC
  Filled 2015-09-06: qty 2

## 2015-09-06 MED ORDER — DEXAMETHASONE SODIUM PHOSPHATE 4 MG/ML IJ SOLN
INTRAMUSCULAR | Status: DC | PRN
  Administered 2015-09-06: 8 mg via INTRAVENOUS

## 2015-09-06 MED ORDER — CIPROFLOXACIN HCL 500 MG PO TABS: 500.0000 mg | ORAL_TABLET | Freq: Two times a day (BID) | ORAL | Status: DC

## 2015-09-06 MED ORDER — PHENAZOPYRIDINE HCL 100 MG PO TABS
ORAL_TABLET | ORAL | Status: AC
  Filled 2015-09-06: qty 2

## 2015-09-06 MED ORDER — OXYCODONE HCL 5 MG/5ML PO SOLN
5.0000 mg | Freq: Once | ORAL | Status: AC | PRN
  Filled 2015-09-06: qty 5

## 2015-09-06 MED ORDER — PROPOFOL 10 MG/ML IV BOLUS
INTRAVENOUS | Status: DC | PRN
  Administered 2015-09-06: 150 mg via INTRAVENOUS
  Administered 2015-09-06: 50 mg via INTRAVENOUS

## 2015-09-06 MED ORDER — FENTANYL CITRATE (PF) 100 MCG/2ML IJ SOLN
25.0000 ug | INTRAMUSCULAR | Status: DC | PRN
  Filled 2015-09-06: qty 1

## 2015-09-06 MED ORDER — LIDOCAINE HCL (CARDIAC) 20 MG/ML IV SOLN
INTRAVENOUS | Status: AC
  Filled 2015-09-06: qty 5

## 2015-09-06 MED ORDER — OXYCODONE HCL 5 MG PO TABS
ORAL_TABLET | ORAL | Status: AC
  Filled 2015-09-06: qty 1

## 2015-09-06 MED ORDER — OXYCODONE HCL 5 MG PO TABS
5.0000 mg | ORAL_TABLET | Freq: Once | ORAL | Status: AC | PRN
  Administered 2015-09-06: 5 mg via ORAL
  Filled 2015-09-06: qty 1

## 2015-09-06 MED ORDER — LIDOCAINE HCL (CARDIAC) 20 MG/ML IV SOLN
INTRAVENOUS | Status: DC | PRN
  Administered 2015-09-06: 80 mg via INTRAVENOUS
  Administered 2015-09-06: 20 mg via INTRAVENOUS

## 2015-09-06 MED ORDER — ONDANSETRON HCL 4 MG/2ML IJ SOLN
INTRAMUSCULAR | Status: DC | PRN
  Administered 2015-09-06: 4 mg via INTRAVENOUS

## 2015-09-06 MED ORDER — MIDAZOLAM HCL 5 MG/5ML IJ SOLN
INTRAMUSCULAR | Status: DC | PRN
  Administered 2015-09-06: 1 mg via INTRAVENOUS

## 2015-09-06 MED ORDER — ACETAMINOPHEN 325 MG PO TABS
325.0000 mg | ORAL_TABLET | ORAL | Status: DC | PRN
  Filled 2015-09-06: qty 2

## 2015-09-06 MED ORDER — SODIUM CHLORIDE 0.9 % IR SOLN
Status: DC | PRN
  Administered 2015-09-06: 6000 mL

## 2015-09-06 SURGICAL SUPPLY — 39 items
BAG DRAIN URO-CYSTO SKYTR STRL (DRAIN) ×3 IMPLANT
BAG URINE DRAINAGE (UROLOGICAL SUPPLIES) ×3 IMPLANT
BAG URINE LEG 19OZ MD ST LTX (BAG) IMPLANT
CATH FOLEY 2WAY SLVR  5CC 20FR (CATHETERS)
CATH FOLEY 2WAY SLVR  5CC 22FR (CATHETERS)
CATH FOLEY 2WAY SLVR  5CC 24FR (CATHETERS) ×1
CATH FOLEY 2WAY SLVR 5CC 20FR (CATHETERS) IMPLANT
CATH FOLEY 2WAY SLVR 5CC 22FR (CATHETERS) IMPLANT
CATH FOLEY 2WAY SLVR 5CC 24FR (CATHETERS) ×2 IMPLANT
CATH FOLEY 3WAY 20FR (CATHETERS) IMPLANT
CATH FOLEY 3WAY 30CC 24FR (CATHETERS) ×1
CATH HEMA 3WAY 30CC 24FR COUDE (CATHETERS) IMPLANT
CATH HEMA 3WAY 30CC 24FR RND (CATHETERS) IMPLANT
CATH URTH STD 24FR FL 3W 2 (CATHETERS) ×2 IMPLANT
CLOTH BEACON ORANGE TIMEOUT ST (SAFETY) ×3 IMPLANT
ELECT BIVAP BIPO 22/24 DONUT (ELECTROSURGICAL) ×3
ELECT BUTTON BIOP 24F 90D PLAS (MISCELLANEOUS) IMPLANT
ELECT REM PT RETURN 9FT ADLT (ELECTROSURGICAL) ×3
ELECTRD BIVAP BIPO 22/24 DONUT (ELECTROSURGICAL) ×2 IMPLANT
ELECTRODE REM PT RTRN 9FT ADLT (ELECTROSURGICAL) ×2 IMPLANT
EVACUATOR MICROVAS BLADDER (UROLOGICAL SUPPLIES) ×3 IMPLANT
GLOVE BIO SURGEON STRL SZ8 (GLOVE) ×3 IMPLANT
GOWN STRL REUS W/ TWL LRG LVL3 (GOWN DISPOSABLE) ×2 IMPLANT
GOWN STRL REUS W/ TWL XL LVL3 (GOWN DISPOSABLE) ×2 IMPLANT
GOWN STRL REUS W/TWL LRG LVL3 (GOWN DISPOSABLE) ×1
GOWN STRL REUS W/TWL XL LVL3 (GOWN DISPOSABLE) ×1
GOWN XL W/COTTON TOWEL STD (GOWNS) ×3 IMPLANT
HOLDER FOLEY CATH W/STRAP (MISCELLANEOUS) ×3 IMPLANT
IV NS IRRIG 3000ML ARTHROMATIC (IV SOLUTION) ×6 IMPLANT
KIT ROOM TURNOVER WOR (KITS) ×3 IMPLANT
LOOP CUT BIPOLAR 24F LRG (ELECTROSURGICAL) ×3 IMPLANT
MANIFOLD NEPTUNE II (INSTRUMENTS) ×3 IMPLANT
PACK CYSTO (CUSTOM PROCEDURE TRAY) ×3 IMPLANT
PLUG CATH AND CAP STER (CATHETERS) IMPLANT
SET ASPIRATION TUBING (TUBING) ×3 IMPLANT
SYR 30ML LL (SYRINGE) ×3 IMPLANT
SYRINGE IRR TOOMEY STRL 70CC (SYRINGE) ×3 IMPLANT
TUBE CONNECTING 12X1/4 (SUCTIONS) ×3 IMPLANT
WATER STERILE IRR 3000ML UROMA (IV SOLUTION) IMPLANT

## 2015-09-06 NOTE — Transfer of Care (Signed)
Last Vitals:  Filed Vitals:   09/06/15 0829  BP: 157/79  Pulse: 71  Temp: 36.5 C  Resp: 16    Immediate Anesthesia Transfer of Care Note  Patient: Dustin Ramirez  Procedure(s) Performed: Procedure(s) (LRB): TRANSURETHRAL RESECTION OF THE PROSTATE WITH GYRUS VERSUS            (TURP)  (N/A)  Patient Location: PACU  Anesthesia Type: General  Level of Consciousness: awake, alert  and oriented  Airway & Oxygen Therapy: Patient Spontanous Breathing and Patient connected to face mask oxygen  Post-op Assessment: Report given to PACU RN and Post -op Vital signs reviewed and stable  Post vital signs: Reviewed and stable  Complications: No apparent anesthesia complications

## 2015-09-06 NOTE — Anesthesia Procedure Notes (Signed)
Procedure Name: LMA Insertion Date/Time: 09/06/2015 9:52 AM Performed by: Mechele Claude Pre-anesthesia Checklist: Patient identified, Emergency Drugs available, Suction available and Patient being monitored Patient Re-evaluated:Patient Re-evaluated prior to inductionOxygen Delivery Method: Circle System Utilized Preoxygenation: Pre-oxygenation with 100% oxygen Intubation Type: IV induction Ventilation: Mask ventilation without difficulty LMA: LMA inserted LMA Size: 4.0 Number of attempts: 1 Airway Equipment and Method: bite block Placement Confirmation: positive ETCO2 Tube secured with: Tape Dental Injury: Teeth and Oropharynx as per pre-operative assessment

## 2015-09-06 NOTE — H&P (Signed)
[name] is a 74 year old male with urinary retention and a history of prostate cancer.   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 There is no family history of prostate cancer.    PSAs:  12/12 - 2.9  4/15 - 6.2  TRUS/BX 02/17/14: Prostate volume - 23 cc with no abnormality noted on ultrasound.  Pathology: Adenocarcinoma Gleason 3+3 = 6 in both lobes 5/12 cores positive.   Treatment: I-125 seed implant 05/08/14.    Erectile dysfunction: This has been managed with Viagra 50 mg.     Urinary retention: He developed urinary retention and failed voiding trials. When a catheter was placed on 06/10/14 he was found to have 1250 cc in the bladder.   Urodynamics 07/28/14: He has a large cystometric capacity and although he does generate an adequate detrusor contraction voluntarily he has outlet obstruction. He also has some instability which is secondary. The problem is that he has had previous prostatic radiation and therefore outlet reduction surgery carries an increased risk of stress incontinence.   Treatment: SP tube placement 08/17/14.     Interval history: He reports that he has not had any problems with his suprapubic tube. He has tried clamping as I had discussed with him previously although he has only voided about 1-2 ounces at a time with the tube clamped despite allowing his bladder to fill completely.    PSA 11/16 - 0.4    Past Medical History Problems  1. History of Adenocarcinoma of prostate (C61) 2. History of hypertension (Z86.79)  Surgical History Problems  1. History of Bladder Cystotomy With Drainage 2. History of Needle Biopsy Of Prostate 3. History of No Surgical Problems 4. History of Surgery Prostate Transperineal Placement Of Needles  Current Meds 1. Fish Oil 1000 MG Oral Capsule;  Therapy: (Recorded:30Apr2015) to Recorded 2. Lisinopril 10  MG Oral Tablet;  Therapy: (Recorded:30Apr2015) to Recorded 3. Milk Thistle CAPS;  Therapy: (Recorded:30Apr2015) to Recorded 4. Stool Softener TABS;  Therapy: (Recorded:03Nov2015) to Recorded 5. Toviaz 8 MG Oral Tablet Extended Release 24 Hour; TAKE ONE TABLET BY MOUTH  ONCE DAILY;  Therapy: [id number] to (Evaluate:11Feb2017)  Requested for: [id number]; Last  Rx:17Feb2016 Ordered  Allergies Medication  1. Penicillins  Family History Problems  1. Family history of Colon cancer : Mother 2. Family history of diabetes mellitus (Z83.3) : Brother 3. Family history of lung cancer (Z80.1) : Father  Social History Problems  1. Alcohol use (Z78.9)   2 glasses of wine at night 2. Caffeine use (F15.90)   2 coffees in the morning 3. Father deceased 85. Former smoker [phone]) 5. Married 6. Mother deceased 23. Number of children   1 daughter; 1 son  Review of Systems Genitourinary, constitutional, skin, eye, otolaryngeal, hematologic/lymphatic, cardiovascular, pulmonary, endocrine, musculoskeletal, gastrointestinal, neurological and psychiatric system(s) were reviewed and pertinent findings if present are noted.  Genitourinary: urinary frequency and erectile dysfunction.  Gastrointestinal: heartburn.  Eyes: blurred vision.   Vitals Vital Signs  Blood Pressure: 130 / 74 Heart Rate: 78  Physical Exam Constitutional: Well nourished and well developed . No acute distress.  ENT:. The ears and nose are normal in appearance.  Neck: The appearance of the neck is normal and no neck mass is present.  Pulmonary: No respiratory distress and normal respiratory rhythm and effort.  Cardiovascular: Heart rate and rhythm are normal . No  peripheral edema.  Abdomen: The abdomen is soft and nontender. No masses are palpated. No CVA tenderness. Was located in the suprapubic region. No hernias are palpable. No hepatosplenomegaly noted.  Rectal: Rectal exam demonstrates normal sphincter tone, no  tenderness and no masses. The prostate has no nodularity and is not tender. The left seminal vesicle is nonpalpable. The right seminal vesicle is nonpalpable. The perineum is normal on inspection.  Genitourinary: Examination of the penis demonstrates no discharge, no masses, no lesions and a normal meatus. The scrotum is without lesions. The right epididymis is palpably normal and non-tender. The left epididymis is palpably normal and non-tender. The right testis is non-tender and without masses. The left testis is non-tender and without masses.  Lymphatics: The femoral and inguinal nodes are not enlarged or tender.  Skin: Normal skin turgor, no visible rash and no visible skin lesions.  Neuro/Psych:. Mood and affect are appropriate.    Results/Data  PSA.    Assessment    I first went over the results of his PSA which has decreased further now at 0.40.     I had a long discussion with he, his wife and daughter today. He developed urinary retention after radioactive seed implantation and was found by urodynamics to have an adequate detrusor contraction. Since he has not begun to void spontaneously other than 1-2 ounces at a time I told him we would discuss surgical intervention at 1 year which it currently is.  We have discussed the procedure transurethral resection and transurethral incision of the prostate. I've gone over each of these procedures with him in detail. We went over the risks and complications and in his case the increased risk of urinary incontinence occurring after prostate resection in a patient who has had radiation. I told him that in his case because of the fact that he is in good health I think there is a fairly low likelihood of incontinence but in order to decrease this risk as much as possible when I have proposed is proceeding with a transurethral incision of the prostate initially. The understanding is that this will reduce the risk of incontinence occurring but also  carries a greater risk that he may not begin to void spontaneously and may possibly require a second procedure to resect more tissue. The other option would be obviously to resect all the prostate but this would obviously increases risk of incontinence. He said he would like to proceed with transurethral incision understanding that a second procedure may be necessary and I therefore have gone over the procedure with him in detail including the other risks and complications, the probability of success, the outpatient nature of the procedure and the anticipated postoperative course.  A preop urine culture was performed and found to be positive for Klebsiella.  He was placed on antibiotic therapy which he has taken for 4 weeks prior to his surgery.  The organism was sensitive to all antibiotics except tetracycline and ampicillin.  After the surgery I told him I would leave the suprapubic tube until I was sure he was voiding spontaneously and during the surgery I could potentially perform filling and emptying of his bladder under various pressures to determine if the transurethral incision decreased his outlet resistance.   Plan   He'll be scheduled for transurethral incision of the prostate.

## 2015-09-06 NOTE — Anesthesia Postprocedure Evaluation (Signed)
Anesthesia Post Note  Patient: Dustin Ramirez. Dustin Ramirez  Procedure(s) Performed: Procedure(s) (LRB): TRANSURETHRAL RESECTION OF THE PROSTATE WITH GYRUS VERSUS            (TURP)  (N/A)  Patient location during evaluation: PACU Anesthesia Type: General Level of consciousness: awake Pain management: pain level controlled Vital Signs Assessment: post-procedure vital signs reviewed and stable Respiratory status: spontaneous breathing Cardiovascular status: stable Postop Assessment: no signs of nausea or vomiting Anesthetic complications: no    Last Vitals:  Filed Vitals:   09/06/15 1115 09/06/15 1130  BP: 145/78   Pulse: 61 60  Temp:    Resp: 17     Last Pain: There were no vitals filed for this visit.               Larin Weissberg

## 2015-09-06 NOTE — Op Note (Signed)
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  POST-OPERATIVE DIAGNOSIS: Same  PROCEDURE: TUIP  SURGEON:  Thana Farr Markee Matera  INDICATION: Dustin Ramirez is a 74 year old male who was diagnosed with prostate cancer and underwent I-125 seed implantation in 8/15. He developed urinary retention in 9/15 with 1250 mL found in his bladder. I performed urodynamics and found he did have an adequate detrusor contraction. He elected to undergo suprapubic tube placement to manage this while awaiting for his seeds to perform adequate radiation to the prostate. He has now elected to proceed with a transurethral incision of the prostate.  ANESTHESIA:  General  EBL:  Minimal  DRAINS: Suprapubic tube was left in place.  SPECIMEN:  Prostate chips to pathology  After informed consent the patient was brought to the major OR and placed on the table. He was administered general anesthesia and then moved to the dorsal lithotomy position. His genitalia was sterilely prepped and draped and an official timeout was then performed.  I found his urethral meatus was slightly narrow and therefore had to dilate this with [employer] sounds to 13 [place]. I then passed the 6 French resectoscope sheath with the visual obturator was passed down the urethra which was noted be normal in appearance. The prostatic urethra revealed slight lateral lobe hypertrophy with a very high, fixed bladder neck. I was able to negotiate the scope over the bladder neck and into the bladder. I then inserted the resectoscope element with 30 lens and  performed a systematic inspection of the bladder. I noted the bladder had 1+ trabeculation but was free of any tumor stones or inflammatory lesions. The ureteral orifices were noted to be of normal configuration and position. The suprapubic tube balloon was identified at the dome.   I then removed the resectoscope and connected the water to  the suprapubic tube and filled his bladder until it was palpable in the lower abdomen. I then stopped filling this and reinserted the resectoscope and drain the bladder finding 1200 mL in the bladder. Despite this amount of fluid in the bladder and with the patient under anesthesia he did not have any flow per urethra.  Resection was then begun in the midline using the loop and I cut through the bladder neck and the floor of the prostate back to the level of the veru. I deepened this somewhat and widened slightly. I then fulgurated the area and any bleeding points. Before and after photographs had been taken. The prostatic chips were then flushed into the bladder and the Microvasive evacuator was then used to evacuate all chips from the bladder. Reinspection of the bladder revealed the mucosa to be intact, the ureteral orifices intact as well and well away from the bladder neck and area of resection. There were no prostatic chips remaining within the bladder. The prostatic capsule was intact throughout with no perforation and there was no active bleeding noted at the end of the procedure.  The resectoscope was therefore removed after filling the bladder again with approximately 1000 mL. This time there was significant flow per urethra of the fluid indicating a decreasing outlet resistance. The suprapubic tube was capped and the patient was awakened and taken to recovery room in stable and satisfactory condition. He tolerated the procedure well and there were no intraoperative complications.  PLAN OF CARE: He will be discharged home after full recovery. At home he will perform voiding trials by keeping his suprapubic tube  capped and using this to measure post void residuals which he will record and bring with him to his follow-up visit.  PATIENT DISPOSITION:  PACU - Hemodynamically stable.

## 2015-09-06 NOTE — Discharge Instructions (Addendum)
Post transurethral incision of the prostate (TUIP) instructions  Your recent prostate surgery requires very special post hospital care. Despite the fact that no skin incisions were used the area around the prostate incision is quite raw and is covered with a scab to promote healing and prevent bleeding. Certain cautions are needed to assure that the scab is not disturbed of the next 2-3 weeks while the healing proceeds.  Because the raw surface in your prostate and the irritating effects of urine you may expect frequency of urination and/or urgency (a stronger desire to urinate) and perhaps even getting up at night more often. This will usually resolve or improve slowly over the healing period. You may see some blood in your urine over the first 6 weeks. Do not be alarmed, even if the urine was clear for a while. Get off your feet and drink lots of fluids until clearing occurs. If you start to pass clots or don't improve call us.  Catheter: (If you are discharged with a catheter.) 1. Keep your catheter secured to your leg at all times with tape or the supplied strap. 2. You may experience leakage of urine around your catheter- as long as the  catheter continues to drain, this is normal.  If your catheter stops draining  go to the ER. 3. You may also have blood in your urine, even after it has been clear for  several days; you may even pass some small blood clots or other material.  This  is normal as well.  If this happens, sit down and drink plenty of water to help  make urine to flush out your bladder.  If the blood in your urine becomes worse  after doing this, contact our office or return to the ER. 4. I would like for you to record the amount urinated and then the amount remaining in the bladder after urination by opening the suprapubic tube and recording this as well. Bring it with you to your follow-up appointment.  Diet:  You may return to your normal diet immediately. Because of the raw  surface of your bladder, alcohol, spicy foods, foods high in acid and drinks with caffeine may cause irritation or frequency and should be used in moderation. To keep your urine flowing freely and avoid constipation, drink plenty of fluids during the day (8-10 glasses). Tip: Avoid cranberry juice because it is very acidic.  Activity:  Your physical activity doesn't need to be restricted. However, if you are very active, you may see some blood in the urine. We suggest that you reduce your activity under the circumstances until the bleeding has stopped.  Bowels:  It is important to keep your bowels regular during the postoperative period. Straining with bowel movements can cause bleeding. A bowel movement every other day is reasonable. Use a mild laxative if needed, such as milk of magnesia 2-3 tablespoons, or 2 Dulcolax tablets. Call if you continue to have problems. If you had been taking narcotics for pain, before, during or after your surgery, you may be constipated. Take a laxative if necessary.  Medication:  You should resume your pre-surgery medications unless told not to. DO NOT RESUME YOUR ASPIRIN, WARFARIN, OR OTHER BLOOD THINNER FOR 1 WEEK. In addition you may be given an antibiotic to prevent or treat infection. Antibiotics are not always necessary. All medication should be taken as prescribed until the bottles are finished unless you are having an unusual reaction to one of the drugs.  Problems you should report to [place]:  a. Fever greater than 101F. b. Heavy bleeding, or clots (see notes above about blood in urine). c. Inability to urinate. d. Drug reactions (hives, rash, nausea, vomiting, diarrhea). e. Severe burning or pain with urination that is not improving.  Post Anesthesia Home Care Instructions  Activity: Get plenty of rest for the remainder of the day. A responsible adult should stay with you for 24 hours following the procedure.  For the next 24 hours, DO  NOT: -Drive a car -Paediatric nurse -Drink alcoholic beverages -Take any medication unless instructed by your physician -Make any legal decisions or sign important papers.  Meals: Start with liquid foods such as gelatin or soup. Progress to regular foods as tolerated. Avoid greasy, spicy, heavy foods. If nausea and/or vomiting occur, drink only clear liquids until the nausea and/or vomiting subsides. Call your physician if vomiting continues.  Special Instructions/Symptoms: Your throat may feel dry or sore from the anesthesia or the breathing tube placed in your throat during surgery. If this causes discomfort, gargle with warm salt water. The discomfort should disappear within 24 hours.

## 2015-09-06 NOTE — Anesthesia Preprocedure Evaluation (Signed)
Anesthesia Evaluation  Patient identified by MRN, date of birth, ID band Patient awake    Reviewed: Allergy & Precautions, NPO status , Patient's Chart, lab work & pertinent test results  History of Anesthesia Complications Negative for: history of anesthetic complications  Airway Mallampati: II  TM Distance: >3 FB Neck ROM: Full    Dental  (+) Teeth Intact   Pulmonary neg shortness of breath, neg sleep apnea, neg COPD, neg recent URI, former smoker, neg PE   breath sounds clear to auscultation       Cardiovascular hypertension, Pt. on medications  Rhythm:Regular     Neuro/Psych negative neurological ROS  negative psych ROS   GI/Hepatic Neg liver ROS, GERD  Controlled,  Endo/Other  negative endocrine ROS  Renal/GU negative Renal ROS     Musculoskeletal   Abdominal   Peds  Hematology negative hematology ROS (+)   Anesthesia Other Findings   Reproductive/Obstetrics                             Anesthesia Physical Anesthesia Plan  ASA: II  Anesthesia Plan: General   Post-op Pain Management:    Induction: Intravenous  Airway Management Planned: LMA and Oral ETT  Additional Equipment: None  Intra-op Plan:   Post-operative Plan: Extubation in OR  Informed Consent: I have reviewed the patients History and Physical, chart, labs and discussed the procedure including the risks, benefits and alternatives for the proposed anesthesia with the patient or authorized representative who has indicated his/her understanding and acceptance.   Dental advisory given  Plan Discussed with: Surgeon  Anesthesia Plan Comments:         Anesthesia Quick Evaluation

## 2015-09-07 ENCOUNTER — Encounter (HOSPITAL_BASED_OUTPATIENT_CLINIC_OR_DEPARTMENT_OTHER): Payer: Self-pay | Admitting: Urology

## 2015-11-11 DIAGNOSIS — N401 Enlarged prostate with lower urinary tract symptoms: Secondary | ICD-10-CM | POA: Diagnosis not present

## 2015-11-11 DIAGNOSIS — R338 Other retention of urine: Secondary | ICD-10-CM | POA: Diagnosis not present

## 2015-11-11 DIAGNOSIS — R350 Frequency of micturition: Secondary | ICD-10-CM | POA: Diagnosis not present

## 2015-12-08 DIAGNOSIS — R338 Other retention of urine: Secondary | ICD-10-CM | POA: Diagnosis not present

## 2015-12-08 DIAGNOSIS — N401 Enlarged prostate with lower urinary tract symptoms: Secondary | ICD-10-CM | POA: Diagnosis not present

## 2015-12-23 DIAGNOSIS — R338 Other retention of urine: Secondary | ICD-10-CM | POA: Diagnosis not present

## 2015-12-29 ENCOUNTER — Other Ambulatory Visit: Payer: Self-pay | Admitting: Urology

## 2015-12-29 DIAGNOSIS — R350 Frequency of micturition: Secondary | ICD-10-CM | POA: Diagnosis not present

## 2016-01-06 DIAGNOSIS — H26493 Other secondary cataract, bilateral: Secondary | ICD-10-CM | POA: Diagnosis not present

## 2016-01-11 DIAGNOSIS — N401 Enlarged prostate with lower urinary tract symptoms: Secondary | ICD-10-CM | POA: Diagnosis not present

## 2016-01-17 ENCOUNTER — Encounter (HOSPITAL_BASED_OUTPATIENT_CLINIC_OR_DEPARTMENT_OTHER): Payer: Self-pay | Admitting: *Deleted

## 2016-01-17 NOTE — Progress Notes (Signed)
SPOKE W/ PT'S WIFE.  NPO AFTER MN.  ARRIVE AT 0730.  NEEDS ISTAT. CURRENT EKG IN CHART AND EPIC.

## 2016-01-18 ENCOUNTER — Other Ambulatory Visit: Payer: Self-pay | Admitting: Urology

## 2016-01-21 ENCOUNTER — Ambulatory Visit (HOSPITAL_BASED_OUTPATIENT_CLINIC_OR_DEPARTMENT_OTHER): Payer: Commercial Managed Care - HMO | Admitting: Anesthesiology

## 2016-01-21 ENCOUNTER — Encounter (HOSPITAL_BASED_OUTPATIENT_CLINIC_OR_DEPARTMENT_OTHER)
Admission: RE | Disposition: A | Discharge status: Home / Self Care | Payer: Self-pay | Source: Ambulatory Visit | Attending: Urology

## 2016-01-21 ENCOUNTER — Encounter (HOSPITAL_BASED_OUTPATIENT_CLINIC_OR_DEPARTMENT_OTHER): Payer: Self-pay | Admitting: *Deleted

## 2016-01-21 ENCOUNTER — Ambulatory Visit (HOSPITAL_BASED_OUTPATIENT_CLINIC_OR_DEPARTMENT_OTHER)
Admission: RE | Admit: 2016-01-21 | Discharge: 2016-01-21 | Disposition: A | Discharge status: Home / Self Care | Payer: Commercial Managed Care - HMO | Source: Ambulatory Visit | Attending: Urology | Admitting: Urology

## 2016-01-21 DIAGNOSIS — N529 Male erectile dysfunction, unspecified: Secondary | ICD-10-CM | POA: Insufficient documentation

## 2016-01-21 DIAGNOSIS — C61 Malignant neoplasm of prostate: Secondary | ICD-10-CM | POA: Diagnosis not present

## 2016-01-21 DIAGNOSIS — N401 Enlarged prostate with lower urinary tract symptoms: Secondary | ICD-10-CM | POA: Diagnosis not present

## 2016-01-21 DIAGNOSIS — Z79899 Other long term (current) drug therapy: Secondary | ICD-10-CM | POA: Insufficient documentation

## 2016-01-21 DIAGNOSIS — R338 Other retention of urine: Secondary | ICD-10-CM | POA: Diagnosis not present

## 2016-01-21 DIAGNOSIS — I10 Essential (primary) hypertension: Secondary | ICD-10-CM | POA: Insufficient documentation

## 2016-01-21 DIAGNOSIS — N138 Other obstructive and reflux uropathy: Secondary | ICD-10-CM | POA: Insufficient documentation

## 2016-01-21 DIAGNOSIS — Z87891 Personal history of nicotine dependence: Secondary | ICD-10-CM | POA: Diagnosis not present

## 2016-01-21 DIAGNOSIS — K219 Gastro-esophageal reflux disease without esophagitis: Secondary | ICD-10-CM | POA: Diagnosis not present

## 2016-01-21 DIAGNOSIS — R3914 Feeling of incomplete bladder emptying: Secondary | ICD-10-CM | POA: Diagnosis not present

## 2016-01-21 HISTORY — PX: TRANSURETHRAL RESECTION OF PROSTATE: SHX73

## 2016-01-21 LAB — POCT I-STAT, CHEM 8
BUN: 15 mg/dL (ref 6–20)
Calcium, Ion: 1.29 mmol/L (ref 1.13–1.30)
Chloride: 105 mmol/L (ref 101–111)
Creatinine, Ser: 1.1 mg/dL (ref 0.61–1.24)
Glucose, Bld: 117 mg/dL — ABNORMAL HIGH (ref 65–99)
HCT: 45 % (ref 39.0–52.0)
Hemoglobin: 15.3 g/dL (ref 13.0–17.0)
Potassium: 4.4 mmol/L (ref 3.5–5.1)
Sodium: 141 mmol/L (ref 135–145)
TCO2: 22 mmol/L (ref 0–100)

## 2016-01-21 SURGERY — TRANSURETHRAL RESECTION OF THE PROSTATE WITH GYRUS INSTRUMENTS
Anesthesia: General

## 2016-01-21 MED ORDER — MIDAZOLAM HCL 2 MG/2ML IJ SOLN
INTRAMUSCULAR | Status: AC
  Filled 2016-01-21: qty 2

## 2016-01-21 MED ORDER — PHENAZOPYRIDINE HCL 200 MG PO TABS: 200.0000 mg | ORAL_TABLET | Freq: Three times a day (TID) | ORAL | Status: DC | PRN

## 2016-01-21 MED ORDER — VANCOMYCIN HCL IN DEXTROSE 1-5 GM/200ML-% IV SOLN
1000.0000 mg | Freq: Once | INTRAVENOUS | Status: AC
  Administered 2016-01-21: 1000 mg via INTRAVENOUS
  Filled 2016-01-21 (×2): qty 200

## 2016-01-21 MED ORDER — MIDAZOLAM HCL 5 MG/5ML IJ SOLN
INTRAMUSCULAR | Status: DC | PRN
  Administered 2016-01-21: 1 mg via INTRAVENOUS

## 2016-01-21 MED ORDER — FENTANYL CITRATE (PF) 100 MCG/2ML IJ SOLN
INTRAMUSCULAR | Status: DC | PRN
  Administered 2016-01-21: 25 ug via INTRAVENOUS
  Administered 2016-01-21: 50 ug via INTRAVENOUS
  Administered 2016-01-21 (×2): 25 ug via INTRAVENOUS
  Administered 2016-01-21: 50 ug via INTRAVENOUS
  Administered 2016-01-21: 25 ug via INTRAVENOUS

## 2016-01-21 MED ORDER — OXYCODONE HCL 10 MG PO TABS: 10.0000 mg | ORAL_TABLET | ORAL | Status: DC | PRN

## 2016-01-21 MED ORDER — FENTANYL CITRATE (PF) 100 MCG/2ML IJ SOLN
25.0000 ug | INTRAMUSCULAR | Status: DC | PRN
  Filled 2016-01-21: qty 1

## 2016-01-21 MED ORDER — PROPOFOL 10 MG/ML IV BOLUS
INTRAVENOUS | Status: DC | PRN
  Administered 2016-01-21: 150 mg via INTRAVENOUS
  Administered 2016-01-21 (×2): 50 mg via INTRAVENOUS

## 2016-01-21 MED ORDER — PHENAZOPYRIDINE HCL 100 MG PO TABS
ORAL_TABLET | ORAL | Status: AC
  Filled 2016-01-21: qty 2

## 2016-01-21 MED ORDER — LACTATED RINGERS IV SOLN
INTRAVENOUS | Status: DC
  Administered 2016-01-21: 10:00:00 via INTRAVENOUS
  Filled 2016-01-21: qty 1000

## 2016-01-21 MED ORDER — LACTATED RINGERS IV SOLN
INTRAVENOUS | Status: DC
  Administered 2016-01-21: 08:00:00 via INTRAVENOUS
  Filled 2016-01-21: qty 1000

## 2016-01-21 MED ORDER — OXYCODONE HCL 5 MG PO TABS
5.0000 mg | ORAL_TABLET | Freq: Once | ORAL | Status: AC
  Administered 2016-01-21: 5 mg via ORAL
  Filled 2016-01-21: qty 1

## 2016-01-21 MED ORDER — OXYCODONE HCL 5 MG PO TABS
ORAL_TABLET | ORAL | Status: AC
  Filled 2016-01-21: qty 1

## 2016-01-21 MED ORDER — ONDANSETRON HCL 4 MG/2ML IJ SOLN
INTRAMUSCULAR | Status: DC | PRN
  Administered 2016-01-21: 4 mg via INTRAVENOUS

## 2016-01-21 MED ORDER — FENTANYL CITRATE (PF) 100 MCG/2ML IJ SOLN
INTRAMUSCULAR | Status: AC
  Filled 2016-01-21: qty 2

## 2016-01-21 MED ORDER — LIDOCAINE HCL (CARDIAC) 20 MG/ML IV SOLN
INTRAVENOUS | Status: AC
  Filled 2016-01-21: qty 5

## 2016-01-21 MED ORDER — GENTAMICIN SULFATE 40 MG/ML IJ SOLN
5.0000 mg/kg | Freq: Once | INTRAVENOUS | Status: AC
  Administered 2016-01-21: 430 mg via INTRAVENOUS
  Filled 2016-01-21 (×2): qty 10.75

## 2016-01-21 MED ORDER — PHENAZOPYRIDINE HCL 200 MG PO TABS
200.0000 mg | ORAL_TABLET | Freq: Once | ORAL | Status: AC
  Administered 2016-01-21: 200 mg via ORAL
  Filled 2016-01-21: qty 1

## 2016-01-21 MED ORDER — PROMETHAZINE HCL 25 MG/ML IJ SOLN
6.2500 mg | INTRAMUSCULAR | Status: DC | PRN
Start: 2016-01-21 — End: 2016-01-21
  Filled 2016-01-21: qty 1

## 2016-01-21 MED ORDER — DEXAMETHASONE SODIUM PHOSPHATE 4 MG/ML IJ SOLN
INTRAMUSCULAR | Status: DC | PRN
  Administered 2016-01-21: 10 mg via INTRAVENOUS

## 2016-01-21 MED ORDER — ONDANSETRON HCL 4 MG/2ML IJ SOLN
INTRAMUSCULAR | Status: AC
  Filled 2016-01-21: qty 2

## 2016-01-21 MED ORDER — PROPOFOL 10 MG/ML IV BOLUS
INTRAVENOUS | Status: AC
  Filled 2016-01-21: qty 20

## 2016-01-21 MED ORDER — DEXAMETHASONE SODIUM PHOSPHATE 10 MG/ML IJ SOLN
INTRAMUSCULAR | Status: AC
  Filled 2016-01-21: qty 1

## 2016-01-21 MED ORDER — LIDOCAINE HCL (CARDIAC) 20 MG/ML IV SOLN
INTRAVENOUS | Status: DC | PRN
  Administered 2016-01-21: 70 mg via INTRAVENOUS

## 2016-01-21 SURGICAL SUPPLY — 34 items
ADAPTER IRRIG TUBE 2 SPIKE SOL (ADAPTER) ×4 IMPLANT
BAG DRAIN URO-CYSTO SKYTR STRL (DRAIN) ×2 IMPLANT
BAG URINE DRAINAGE (UROLOGICAL SUPPLIES) IMPLANT
BAG URINE LEG 19OZ MD ST LTX (BAG) IMPLANT
CANISTER SUCT LVC 12 LTR MEDI- (MISCELLANEOUS) IMPLANT
CATH FOLEY 2WAY SLVR  5CC 20FR (CATHETERS) ×1
CATH FOLEY 2WAY SLVR  5CC 22FR (CATHETERS)
CATH FOLEY 2WAY SLVR  5CC 24FR (CATHETERS) ×1
CATH FOLEY 2WAY SLVR 5CC 20FR (CATHETERS) ×1 IMPLANT
CATH FOLEY 2WAY SLVR 5CC 22FR (CATHETERS) IMPLANT
CATH FOLEY 2WAY SLVR 5CC 24FR (CATHETERS) ×1 IMPLANT
CATH FOLEY 3WAY 20FR (CATHETERS) IMPLANT
CLOTH BEACON ORANGE TIMEOUT ST (SAFETY) ×2 IMPLANT
ELECT REM PT RETURN 9FT ADLT (ELECTROSURGICAL) ×2
ELECTRODE REM PT RTRN 9FT ADLT (ELECTROSURGICAL) ×1 IMPLANT
EVACUATOR MICROVAS BLADDER (UROLOGICAL SUPPLIES) ×2 IMPLANT
GLOVE BIO SURGEON STRL SZ8 (GLOVE) ×2 IMPLANT
GLOVE BIOGEL PI IND STRL 6.5 (GLOVE) ×1 IMPLANT
GLOVE BIOGEL PI IND STRL 7.5 (GLOVE) ×1 IMPLANT
GLOVE BIOGEL PI INDICATOR 6.5 (GLOVE) ×1
GLOVE BIOGEL PI INDICATOR 7.5 (GLOVE) ×1
GLOVE SURG SS PI 7.5 STRL IVOR (GLOVE) ×2 IMPLANT
GOWN STRL REUS W/ TWL XL LVL3 (GOWN DISPOSABLE) ×1 IMPLANT
GOWN STRL REUS W/TWL XL LVL3 (GOWN DISPOSABLE) ×1
GOWN XL W/COTTON TOWEL STD (GOWNS) ×2 IMPLANT
HOLDER FOLEY CATH W/STRAP (MISCELLANEOUS) IMPLANT
IV NS 1000ML (IV SOLUTION) ×4
IV NS 1000ML BAXH (IV SOLUTION) ×4 IMPLANT
IV NS IRRIG 3000ML ARTHROMATIC (IV SOLUTION) ×2 IMPLANT
KIT ROOM TURNOVER WOR (KITS) ×2 IMPLANT
LOOP CUT BIPOLAR 24F LRG (ELECTROSURGICAL) ×2 IMPLANT
MANIFOLD NEPTUNE II (INSTRUMENTS) ×2 IMPLANT
PACK CYSTO (CUSTOM PROCEDURE TRAY) ×2 IMPLANT
PLUG CATH AND CAP STER (CATHETERS) IMPLANT

## 2016-01-21 NOTE — Anesthesia Procedure Notes (Signed)
Procedure Name: LMA Insertion Date/Time: 01/21/2016 8:54 AM Performed by: Mechele Claude Pre-anesthesia Checklist: Patient identified, Timeout performed, Emergency Drugs available, Suction available and Patient being monitored Patient Re-evaluated:Patient Re-evaluated prior to inductionOxygen Delivery Method: Circle system utilized Intubation Type: IV induction Ventilation: Mask ventilation without difficulty LMA: LMA inserted LMA Size: 4.0 Number of attempts: 1 Placement Confirmation: positive ETCO2 and breath sounds checked- equal and bilateral Tube secured with: Tape Dental Injury: Teeth and Oropharynx as per pre-operative assessment

## 2016-01-21 NOTE — Anesthesia Postprocedure Evaluation (Signed)
Anesthesia Post Note  Patient: Dustin Ramirez  Procedure(s) Performed: Procedure(s) (LRB): TRANSURETHRAL RESECTION OF THE PROSTATE WITH GYRUS INSTRUMENTS (N/A)  Patient location during evaluation: PACU Anesthesia Type: General Level of consciousness: awake and alert Pain management: pain level controlled Vital Signs Assessment: post-procedure vital signs reviewed and stable Respiratory status: spontaneous breathing, nonlabored ventilation, respiratory function stable and patient connected to nasal cannula oxygen Cardiovascular status: blood pressure returned to baseline and stable Postop Assessment: no signs of nausea or vomiting Anesthetic complications: no    Last Vitals:  Filed Vitals:   01/21/16 1100 01/21/16 1115  BP: 113/58   Pulse: 52 55  Temp:    Resp: 14 18    Last Pain:  Filed Vitals:   01/21/16 1117  PainSc: Hamilton Rik Wadel

## 2016-01-21 NOTE — Discharge Instructions (Addendum)
Post transurethral resection of the prostate (TURP) instructions  Your recent prostate surgery requires very special post hospital care. Despite the fact that no skin incisions were used the area around the prostate incision is quite raw and is covered with a scab to promote healing and prevent bleeding. Certain cautions are needed to assure that the scab is not disturbed of the next 2-3 weeks while the healing proceeds.  Because the raw surface in your prostate and the irritating effects of urine you may expect frequency of urination and/or urgency (a stronger desire to urinate) and perhaps even getting up at night more often. This will usually resolve or improve slowly over the healing period. You may see some blood in your urine over the first 6 weeks. Do not be alarmed, even if the urine was clear for a while. Get off your feet and drink lots of fluids until clearing occurs. If you start to pass clots or don't improve call us.  Catheter: (If you are discharged with a catheter.) 1. Keep your catheter secured to your leg at all times with tape or the supplied strap. 2. You may experience leakage of urine around your catheter- as long as the  catheter continues to drain, this is normal.  If your catheter stops draining  go to the ER. 3. You may also have blood in your urine, even after it has been clear for  several days; you may even pass some small blood clots or other material.  This  is normal as well.  If this happens, sit down and drink plenty of water to help  make urine to flush out your bladder.  If the blood in your urine becomes worse  after doing this, contact our office or return to the ER.  Remove foley catheter in am. IF urine becomes bloody-irrigate with sterile saline through your suprapubic catheter as needed. Call us if irrigation doesn't seem to decrease blood in urine.    Diet:  You may return to your normal diet immediately. Because of the raw surface of your bladder,  alcohol, spicy foods, foods high in acid and drinks with caffeine may cause irritation or frequency and should be used in moderation. To keep your urine flowing freely and avoid constipation, drink plenty of fluids during the day (8-10 glasses). Tip: Avoid cranberry juice because it is very acidic.  Activity:  Your physical activity doesn't need to be restricted. However, if you are very active, you may see some blood in the urine. We suggest that you reduce your activity under the circumstances until the bleeding has stopped.  Bowels:  It is important to keep your bowels regular during the postoperative period. Straining with bowel movements can cause bleeding. A bowel movement every other day is reasonable. Use a mild laxative if needed, such as milk of magnesia 2-3 tablespoons, or 2 Dulcolax tablets. Call if you continue to have problems. If you had been taking narcotics for pain, before, during or after your surgery, you may be constipated. Take a laxative if necessary.  Medication:  You should resume your pre-surgery medications unless told not to. DO NOT RESUME YOUR ASPIRIN, WARFARIN, OR OTHER BLOOD THINNER FOR 1 WEEK. In addition you may be given an antibiotic to prevent or treat infection. Antibiotics are not always necessary. All medication should be taken as prescribed until the bottles are finished unless you are having an unusual reaction to one of the drugs.     Problems you should report to [place]:  a. Fever greater than 101F. b. Heavy bleeding, or clots (see notes above about blood in urine). c. Inability to urinate. d. Drug reactions (hives, rash, nausea, vomiting, diarrhea). e. Severe burning or pain with urination that is not improving. Foley Catheter Care, Adult A Foley catheter is a soft, flexible tube that is placed into the bladder to drain urine. A Foley catheter may be inserted if:  You leak urine or are not able to control when you urinate (urinary  incontinence).  You are not able to urinate when you need to (urinary retention).  You had prostate surgery or surgery on the genitals.  You have certain medical conditions, such as multiple sclerosis, dementia, or a spinal cord injury. If you are going home with a Foley catheter in place, follow the instructions below. TAKING CARE OF THE CATHETER 1. Wash your hands with soap and water. 2. Using mild soap and warm water on a clean washcloth:  Clean the area on your body closest to the catheter insertion site using a circular motion, moving away from the catheter. Never wipe toward the catheter because this could sweep bacteria up into the urethra and cause infection.  Remove all traces of soap. Pat the area dry with a clean towel. For males, reposition the foreskin. 3. Attach the catheter to your leg so there is no tension on the catheter. Use adhesive tape or a leg strap. If you are using adhesive tape, remove any sticky residue left behind by the previous tape you used. 4. Keep the drainage bag below the level of the bladder, but keep it off the floor. 5. Check throughout the day to be sure the catheter is working and urine is draining freely. Do not pull on the catheter or try to remove it. Pulling could damage internal tissues.   Emptying the Drainage Bag You must empty your drainage bag when it is  - full or at least 2-3 times a day. 1. Wash your hands with soap and water. 1. Wash the b 2. Keep the drainage bag below your hips, below the level of your bladder. This stops urine from going back into the tubing and into your bladder. 3. Hold the dirty bag over the toilet or a clean container. 4. Open the pour spout at the bottom of the bag and empty the urine into the toilet or container. Do not let the pour spout touch the toilet, container, or any other surface. Doing so can place bacteria on the bag, which can cause an infection. 5. Clean the pour spout with a gauze pad or cotton ball  that has rubbing alcohol on it. 6. Close the pour spout. 7. Attach the bag to your leg with adhesive tape or a leg strap.  PREVENTING INFECTION  Wash your hands before and after handling your catheter.  Take showers daily and wash the area where the catheter enters your body. Do not take baths. Replace wet leg straps with dry ones, if this applies.  Do not use powders, sprays, or lotions on the genital area. Only use creams, lotions, or ointments as directed by your caregiver.  For females, wipe from front to back after each bowel movement.  Drink enough fluids to keep your urine clear or pale yellow unless you have a fluid restriction.  Do not let the drainage bag or tubing touch or lie on the floor.  Wear cotton underwear to absorb moisture and to keep your skin drier. SEEK MEDICAL CARE IF:   Your  urine is cloudy or smells unusually bad.  Your catheter becomes clogged.  You are not draining urine into the bag or your bladder feels full.  Your catheter starts to leak. SEEK IMMEDIATE MEDICAL CARE IF:   You have pain, swelling, redness, or pus where the catheter enters the body.  You have pain in the abdomen, legs, lower back, or bladder.  You have a fever.  You see blood fill the catheter, or your urine is pink or red.  You have nausea, vomiting, or chills.  Your catheter gets pulled out. MAKE SURE YOU:   Understand these instructions.  Will watch your condition.  Will get help right away if you are not doing well or get worse.   This information is not intended to replace advice given to you by your health care provider. Make sure you discuss any questions you have with your health care provider.   Document Released: 09/18/2005 Document Revised: 02/02/2014 Document Reviewed: 09/09/2012 Elsevier Interactive Patient Education 2016 [employer] Anesthesia Home Care Instructions  Activity: Get plenty of rest for the remainder of the day. A responsible  adult should stay with you for 24 hours following the procedure.  For the next 24 hours, DO NOT: -Drive a car -Paediatric nurse -Drink alcoholic beverages -Take any medication unless instructed by your physician -Make any legal decisions or sign important papers.  Meals: Start with liquid foods such as gelatin or soup. Progress to regular foods as tolerated. Avoid greasy, spicy, heavy foods. If nausea and/or vomiting occur, drink only clear liquids until the nausea and/or vomiting subsides. Call your physician if vomiting continues.  Special Instructions/Symptoms: Your throat may feel dry or sore from the anesthesia or the breathing tube placed in your throat during surgery. If this causes discomfort, gargle with warm salt water. The discomfort should disappear within 24 hours.  If you had a scopolamine patch placed behind your ear for the management of post- operative nausea and/or vomiting:  1. The medication in the patch is effective for 72 hours, after which it should be removed.  Wrap patch in a tissue and discard in the trash. Wash hands thoroughly with soap and water. 2. You may remove the patch earlier than 72 hours if you experience unpleasant side effects which may include dry mouth, dizziness or visual disturbances. 3. Avoid touching the patch. Wash your hands with soap and water after contact with the patch.

## 2016-01-21 NOTE — H&P (Signed)
Mr. Kasperski is a 75 year old male who underwent TUIP in 12/16.              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 There is no family history of prostate cancer.    PSAs:  12/12 - 2.9  4/15 - 6.2  TRUS/BX 02/17/14: Prostate volume - 23 cc with no abnormality noted on ultrasound.  Pathology: Adenocarcinoma Gleason 3+3 = 6 in both lobes 5/12 cores positive.   Treatment: I-125 seed implant 05/08/14.    Erectile dysfunction: This has been managed with Viagra 50 mg.     Urinary retention: He developed urinary retention and failed voiding trials. When a catheter was placed on 06/10/14 he was found to have 1250 cc in the bladder.   Urodynamics 07/28/14: He has a large cystometric capacity and although he does generate an adequate detrusor contraction voluntarily he has outlet obstruction. He also has some instability which is secondary. The problem is that he has had previous prostatic radiation and therefore outlet reduction surgery carries an increased risk of stress incontinence.   Treatment: SP tube placement 08/17/14.  TUIP 09/06/15 - Pathology - benign.     Interval history: I maintain his suprapubic tube after his prostate incision and he had been clamping his tube at night and for several hours during the day but had not begun to void a significant amount when he was seen last month. He reports to me today that he is voiding only about 2 or 3 ounces and then when he drains with his suprapubic tube he has a significant amount the drains out.     PSA 11/16 - 0.4    Vitals Vital Signs  Height: 5 ft 11 in Weight: 190 lb  BMI Calculated: 26.5 BSA Calculated: 2.06 Blood Pressure: 133 / 83 Heart Rate: 98  Review of Systems Genitourinary, constitutional, skin, eye, otolaryngeal, hematologic/lymphatic, cardiovascular, pulmonary, endocrine, musculoskeletal, gastrointestinal, neurological and psychiatric  system(s) were reviewed and pertinent findings if present are noted.  Genitourinary: urinary frequency and erectile dysfunction.  Gastrointestinal: heartburn.  Eyes: blurred vision.    Physical Exam Constitutional: Well nourished and well developed . No acute distress.  ENT:. The ears and nose are normal in appearance.  Neck: The appearance of the neck is normal and no neck mass is present.  Pulmonary: No respiratory distress and normal respiratory rhythm and effort.  Cardiovascular: Heart rate and rhythm are normal . No peripheral edema.  Abdomen: The abdomen is soft and nontender. No masses are palpated. No CVA tenderness. No hernias are palpable. No hepatosplenomegaly noted.  Rectal: Rectal exam demonstrates normal sphincter tone, no tenderness and no masses. Estimated prostate size is 1+. There is obliteration of the median sulcus. The prostate has no nodularity and is not tender. The left seminal vesicle is nonpalpable. The right seminal vesicle is nonpalpable. The perineum is normal on inspection.  Genitourinary: Examination of the penis demonstrates no discharge, no masses, no lesions and a normal meatus. The scrotum is without lesions. The right epididymis is palpably normal and non-tender. The left epididymis is palpably normal and non-tender. The right testis is non-tender and without masses. The left testis is non-tender and without masses.  Lymphatics: The femoral and inguinal nodes are not enlarged or tender.  Skin: Normal skin turgor, no visible rash and no visible skin lesions.  Neuro/Psych:. Mood and affect are appropriate.   Procedure  Procedure: Cystoscopy done on  12/23/15  Indication: Urinary retention.  Informed Consent: Risks, benefits, and potential adverse events were discussed and informed consent was obtained from the patient.  Prep: The patient was prepped with betadine.  Anesthesia:. Local anesthesia was administered intraurethrally with 2% lidocaine jelly.   Procedure Note:  Urethral meatus:. No abnormalities.  Anterior urethra: No abnormalities.  Prostatic urethra:. There was some fibrillar just debris in the prosthetic urethra with his transurethral incision appearing fairly wide open but lateral lobes were causing obstruction.  Bladder: Visulization was clear. The ureteral orifices were in the normal anatomic position bilaterally and had clear efflux of urine. A systematic survey of the bladder demonstrated no bladder tumors or stones. Suprapubic tube was noted at the dome of the bladder. The patient tolerated the procedure well.  Complications: None.    Assessment   We discussed the fact that right now it appears that his detrusor is not contracting hard enough to get the urine out through his obstructing lateral lobes despite the fact that I did resect his bladder neck and developed a very generous trough in the prostatic urethra. I told him that his options would be to continue with the suprapubic tube with hopes that eventually he would begin to void spontaneously versus self-catheterization which he did not want to consider. The other option would be to resect more of his prosthetic tissue with the understanding that there is some risk associated with this. He understands and would like to proceed with repeat resection. I will plan to perform a full transurethral resection of his prostate. I will have him come into the office one week prior and obtain urine for culture in preparation for his surgery.   Plan   1. Continue SP tube drainage for now.  2.  Preop culture done on 01/11/16 was positive for staph. and he was treated with Septra DS.  3. He will be scheduled for TURP.

## 2016-01-21 NOTE — Transfer of Care (Signed)
  Last Vitals:  Filed Vitals:   01/21/16 0745  BP: 128/63  Pulse: 73  Temp: 36.5 C  Resp: 16   Immediate Anesthesia Transfer of Care Note  Patient: Dustin Ramirez  Procedure(s) Performed: Procedure(s) (LRB): TRANSURETHRAL RESECTION OF THE PROSTATE WITH GYRUS INSTRUMENTS (N/A)  Patient Location: PACU  Anesthesia Type: General  Level of Consciousness: awake, alert  and oriented  Airway & Oxygen Therapy: Patient Spontanous Breathing and Patient connected to nasal cannula oxygen  Post-op Assessment: Report given to PACU RN and Post -op Vital signs reviewed and stable  Post vital signs: Reviewed and stable  Complications: No apparent anesthesia complications

## 2016-01-21 NOTE — Op Note (Signed)
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 In 9/15 he had developed urinary retention and had 1250 mL in his bladder. He was evaluated with urodynamics which revealed an adequate detrusor contraction and we waited for some time to allow his radioactive seeds as many half-lives as possible. Eventually underwent a suprapubic tube placement and continued to have difficulty voiding and therefore in 12/16 he underwent a transurethral incision of his prostate which I thought was going to get him opened up and voiding but he continued to be unable to urinate spontaneously. We therefore discussed the options and he has elected to proceed with a full transurethral resection of his prostate at this time. A preoperative urine culture was performed and found to be positive and he was placed on Septra DS which he has remained on until the day of his surgery. He received gentamicin and vancomycin IV preop.  ANESTHESIA:  General  EBL:  Minimal  DRAINS: He has a 16 French Foley catheter as a suprapubic tube and a 20 French Foley catheter in the urethra.  SPECIMEN:  Prostate chips to pathology  After informed consent the patient was brought to the major OR and placed on the table. He was administered general anesthesia and then moved to the dorsal lithotomy position. His genitalia was sterilely prepped and draped and an official timeout was then performed.  I sounded the urethral meatus with Leander Rams sounds up to 30 [place]. Initially the 26 French resectoscope sheath with the visual obturator was passed into the bladder and the obturator removed. I then inserted the resectoscope element with 30 lens and  performed a systematic  inspection of the bladder. I noted the bladder had 2+ trabeculation but was free of any tumor stones or inflammatory lesions. The ureteral orifices were noted to be of normal configuration and position. The suprapubic tube balloon was noted at the dome of the bladder. Withdrawing the scope into the prostatic urethra I noted obstructing bilobar hypertrophy with elongation of the prostatic urethra and evidence of my previous transurethral incision of his prostate with minimal tissue in the midline on the floor of the prostate.[name]  Resection was then begun by resecting the left lobe of the prostate first from the level of the bladder neck back to the level of the Veru at the 5:00 position and then progressed in a counterclockwise direction resecting all of the adenomatous tissue of the left lobe down to the surgical capsule. Bleeding points were cauterized as they were encountered. Previously placed seeds were encountered. I then turned my attention to the right lobe of the prostate and it was resected in an identical fashion. Tissue in the area of the apex was then resected circumferentially with care being taken to maintain the resection proximal to the Veru at all times. The prostatic chips and freed prostatic seeds were then flushed into the bladder and the Microvasive evacuator was then used to evacuate all chips and seeds from the bladder. Reinspection of the bladder revealed the mucosa to be intact, the ureteral orifices intact as well and well away from the bladder neck and area of resection. There were no prostatic chips remaining within the bladder. The prostatic capsule was intact throughout with no perforation and there was no  active bleeding noted at the end of the procedure.  The resectoscope was therefore removed and the 20 Foley catheter was then inserted and balloon was filled to 20 cc. This was placed on mild traction and the bladder was irrigated with the irrigant returning clear. The catheter was  then hooked to closed system drainage and continuous irrigation through the suprapubic tube and the patient was awakened and taken to recovery room in stable and satisfactory condition. He tolerated the procedure well and there were no intraoperative complications.   PATIENT DISPOSITION:  PACU - Hemodynamically stable.

## 2016-01-21 NOTE — Anesthesia Preprocedure Evaluation (Addendum)
Anesthesia Evaluation  Patient identified by MRN, date of birth, ID band Patient awake    Reviewed: Allergy & Precautions, NPO status , Patient's Chart, lab work & pertinent test results  Airway Mallampati: II  TM Distance: >3 FB Neck ROM: Full    Dental  (+) Teeth Intact   Pulmonary former smoker,    breath sounds clear to auscultation       Cardiovascular hypertension, Pt. on medications  Rhythm:Regular Rate:Normal     Neuro/Psych negative neurological ROS  negative psych ROS   GI/Hepatic Neg liver ROS, GERD  Controlled,  Endo/Other  negative endocrine ROS  Renal/GU negative Renal ROS  negative genitourinary   Musculoskeletal negative musculoskeletal ROS (+)   Abdominal   Peds negative pediatric ROS (+)  Hematology negative hematology ROS (+)   Anesthesia Other Findings   Reproductive/Obstetrics negative OB ROS                            09/2015 EKG: normal sinus rhythm.   Anesthesia Physical Anesthesia Plan  ASA: II  Anesthesia Plan: General   Post-op Pain Management:    Induction: Intravenous  Airway Management Planned: LMA  Additional Equipment:   Intra-op Plan:   Post-operative Plan: Extubation in OR  Informed Consent: I have reviewed the patients History and Physical, chart, labs and discussed the procedure including the risks, benefits and alternatives for the proposed anesthesia with the patient or authorized representative who has indicated his/her understanding and acceptance.   Dental advisory given  Plan Discussed with: CRNA  Anesthesia Plan Comments:         Anesthesia Quick Evaluation

## 2016-01-24 ENCOUNTER — Encounter (HOSPITAL_BASED_OUTPATIENT_CLINIC_OR_DEPARTMENT_OTHER): Payer: Self-pay | Admitting: Urology

## 2016-01-28 DIAGNOSIS — C61 Malignant neoplasm of prostate: Secondary | ICD-10-CM | POA: Diagnosis not present

## 2016-01-28 DIAGNOSIS — N5201 Erectile dysfunction due to arterial insufficiency: Secondary | ICD-10-CM | POA: Diagnosis not present

## 2016-01-30 DIAGNOSIS — R609 Edema, unspecified: Secondary | ICD-10-CM | POA: Diagnosis not present

## 2016-01-30 DIAGNOSIS — R301 Vesical tenesmus: Secondary | ICD-10-CM | POA: Diagnosis not present

## 2016-03-23 DIAGNOSIS — Z0000 Encounter for general adult medical examination without abnormal findings: Secondary | ICD-10-CM | POA: Diagnosis not present

## 2016-03-23 DIAGNOSIS — Z79899 Other long term (current) drug therapy: Secondary | ICD-10-CM | POA: Diagnosis not present

## 2016-03-23 DIAGNOSIS — E784 Other hyperlipidemia: Secondary | ICD-10-CM | POA: Diagnosis not present

## 2016-05-11 DIAGNOSIS — R310 Gross hematuria: Secondary | ICD-10-CM | POA: Diagnosis not present

## 2016-05-12 ENCOUNTER — Encounter (HOSPITAL_COMMUNITY)
Admission: AD | Disposition: A | Discharge status: Home / Self Care | Payer: Self-pay | Source: Ambulatory Visit | Attending: Urology

## 2016-05-12 ENCOUNTER — Encounter (HOSPITAL_COMMUNITY): Payer: Self-pay

## 2016-05-12 ENCOUNTER — Observation Stay (HOSPITAL_COMMUNITY)
Admission: AD | Admit: 2016-05-12 | Discharge: 2016-05-13 | Disposition: A | Discharge status: Home / Self Care | Payer: Commercial Managed Care - HMO | Source: Ambulatory Visit | Attending: Urology | Admitting: Urology

## 2016-05-12 ENCOUNTER — Inpatient Hospital Stay (HOSPITAL_COMMUNITY): Payer: Commercial Managed Care - HMO | Admitting: Certified Registered"

## 2016-05-12 ENCOUNTER — Other Ambulatory Visit: Payer: Self-pay | Admitting: Urology

## 2016-05-12 DIAGNOSIS — K219 Gastro-esophageal reflux disease without esophagitis: Secondary | ICD-10-CM | POA: Diagnosis not present

## 2016-05-12 DIAGNOSIS — R338 Other retention of urine: Secondary | ICD-10-CM | POA: Insufficient documentation

## 2016-05-12 DIAGNOSIS — N029 Recurrent and persistent hematuria with unspecified morphologic changes: Principal | ICD-10-CM | POA: Insufficient documentation

## 2016-05-12 DIAGNOSIS — I10 Essential (primary) hypertension: Secondary | ICD-10-CM | POA: Insufficient documentation

## 2016-05-12 DIAGNOSIS — Z8546 Personal history of malignant neoplasm of prostate: Secondary | ICD-10-CM | POA: Diagnosis not present

## 2016-05-12 DIAGNOSIS — Z7982 Long term (current) use of aspirin: Secondary | ICD-10-CM | POA: Diagnosis not present

## 2016-05-12 DIAGNOSIS — Z9079 Acquired absence of other genital organ(s): Secondary | ICD-10-CM | POA: Diagnosis not present

## 2016-05-12 DIAGNOSIS — Z87891 Personal history of nicotine dependence: Secondary | ICD-10-CM | POA: Insufficient documentation

## 2016-05-12 DIAGNOSIS — R310 Gross hematuria: Secondary | ICD-10-CM | POA: Diagnosis not present

## 2016-05-12 DIAGNOSIS — Z79899 Other long term (current) drug therapy: Secondary | ICD-10-CM | POA: Diagnosis not present

## 2016-05-12 DIAGNOSIS — N401 Enlarged prostate with lower urinary tract symptoms: Secondary | ICD-10-CM | POA: Diagnosis not present

## 2016-05-12 DIAGNOSIS — N3289 Other specified disorders of bladder: Secondary | ICD-10-CM | POA: Diagnosis not present

## 2016-05-12 HISTORY — PX: TRANSURETHRAL RESECTION OF PROSTATE: SHX73

## 2016-05-12 LAB — SURGICAL PCR SCREEN
MRSA, PCR: NEGATIVE
Staphylococcus aureus: NEGATIVE

## 2016-05-12 SURGERY — TURP (TRANSURETHRAL RESECTION OF PROSTATE)
Anesthesia: General | Site: Bladder

## 2016-05-12 MED ORDER — PROPOFOL 10 MG/ML IV BOLUS
INTRAVENOUS | Status: DC | PRN
  Administered 2016-05-12: 200 mg via INTRAVENOUS

## 2016-05-12 MED ORDER — HYDROMORPHONE HCL 2 MG/ML IJ SOLN
INTRAMUSCULAR | Status: AC
  Filled 2016-05-12: qty 1

## 2016-05-12 MED ORDER — SENNOSIDES-DOCUSATE SODIUM 8.6-50 MG PO TABS
1.0000 | ORAL_TABLET | Freq: Two times a day (BID) | ORAL | Status: DC
  Administered 2016-05-12 – 2016-05-13 (×2): 1 via ORAL
  Filled 2016-05-12 (×2): qty 1

## 2016-05-12 MED ORDER — FENTANYL CITRATE (PF) 100 MCG/2ML IJ SOLN
INTRAMUSCULAR | Status: AC
  Filled 2016-05-12: qty 2

## 2016-05-12 MED ORDER — ONDANSETRON HCL 4 MG/2ML IJ SOLN
INTRAMUSCULAR | Status: AC
  Filled 2016-05-12: qty 2

## 2016-05-12 MED ORDER — ONDANSETRON HCL 4 MG/2ML IJ SOLN
4.0000 mg | Freq: Four times a day (QID) | INTRAMUSCULAR | Status: DC | PRN
  Administered 2016-05-12: 4 mg via INTRAVENOUS
  Filled 2016-05-12: qty 2

## 2016-05-12 MED ORDER — GENTAMICIN SULFATE 40 MG/ML IJ SOLN
400.0000 mg | Freq: Once | INTRAVENOUS | Status: AC
  Administered 2016-05-12: 400 mg via INTRAVENOUS
  Filled 2016-05-12: qty 10

## 2016-05-12 MED ORDER — LIDOCAINE 2% (20 MG/ML) 5 ML SYRINGE
INTRAMUSCULAR | Status: DC | PRN
  Administered 2016-05-12: 100 mg via INTRAVENOUS

## 2016-05-12 MED ORDER — PROPOFOL 10 MG/ML IV BOLUS
INTRAVENOUS | Status: AC
  Filled 2016-05-12: qty 20

## 2016-05-12 MED ORDER — FENTANYL CITRATE (PF) 100 MCG/2ML IJ SOLN
INTRAMUSCULAR | Status: DC | PRN
  Administered 2016-05-12 (×2): 50 ug via INTRAVENOUS

## 2016-05-12 MED ORDER — OXYCODONE-ACETAMINOPHEN 5-325 MG PO TABS
1.0000 | ORAL_TABLET | Freq: Four times a day (QID) | ORAL | Status: DC | PRN
  Administered 2016-05-12: 1 via ORAL
  Filled 2016-05-12: qty 1

## 2016-05-12 MED ORDER — HYDROMORPHONE HCL 1 MG/ML IJ SOLN
INTRAMUSCULAR | Status: DC | PRN
  Administered 2016-05-12 (×4): 0.5 mg via INTRAVENOUS

## 2016-05-12 MED ORDER — SODIUM CHLORIDE 0.9 % IV SOLN
INTRAVENOUS | Status: DC
  Administered 2016-05-12: 14:00:00 via INTRAVENOUS

## 2016-05-12 MED ORDER — SODIUM CHLORIDE 0.9 % IR SOLN
Status: DC | PRN
  Administered 2016-05-12: 1000 mL

## 2016-05-12 MED ORDER — OXYBUTYNIN CHLORIDE 5 MG PO TABS
5.0000 mg | ORAL_TABLET | Freq: Three times a day (TID) | ORAL | Status: DC | PRN
  Administered 2016-05-13: 5 mg via ORAL
  Filled 2016-05-12: qty 1

## 2016-05-12 MED ORDER — FENTANYL CITRATE (PF) 100 MCG/2ML IJ SOLN
25.0000 ug | INTRAMUSCULAR | Status: DC | PRN
  Administered 2016-05-12 (×2): 50 ug via INTRAVENOUS

## 2016-05-12 MED ORDER — BELLADONNA ALKALOIDS-OPIUM 16.2-60 MG RE SUPP: 1.0000 | Freq: Four times a day (QID) | RECTAL | Status: DC | PRN

## 2016-05-12 MED ORDER — KCL IN DEXTROSE-NACL 20-5-0.45 MEQ/L-%-% IV SOLN
INTRAVENOUS | Status: DC
  Administered 2016-05-12 – 2016-05-13 (×2): via INTRAVENOUS
  Filled 2016-05-12 (×2): qty 1000

## 2016-05-12 MED ORDER — GENTAMICIN SULFATE 40 MG/ML IJ SOLN
5.0000 mg/kg | INTRAVENOUS | Status: DC
  Filled 2016-05-12: qty 10

## 2016-05-12 MED ORDER — ONDANSETRON HCL 4 MG/2ML IJ SOLN: 4.0000 mg | Freq: Four times a day (QID) | INTRAMUSCULAR | Status: DC | PRN

## 2016-05-12 MED ORDER — LISINOPRIL 10 MG PO TABS
10.0000 mg | ORAL_TABLET | Freq: Every day | ORAL | Status: DC
  Administered 2016-05-12 – 2016-05-13 (×2): 10 mg via ORAL
  Filled 2016-05-12 (×2): qty 1

## 2016-05-12 MED ORDER — HYDROMORPHONE HCL 1 MG/ML IJ SOLN
0.5000 mg | INTRAMUSCULAR | Status: DC | PRN
  Administered 2016-05-13: 1 mg via INTRAVENOUS
  Filled 2016-05-12: qty 1

## 2016-05-12 MED ORDER — LIDOCAINE HCL (CARDIAC) 20 MG/ML IV SOLN
INTRAVENOUS | Status: AC
  Filled 2016-05-12: qty 5

## 2016-05-12 MED ORDER — SODIUM CHLORIDE 0.9 % IJ SOLN
INTRAMUSCULAR | Status: AC
  Filled 2016-05-12: qty 10

## 2016-05-12 SURGICAL SUPPLY — 19 items
BAG URINE DRAINAGE (UROLOGICAL SUPPLIES) ×3 IMPLANT
BAG URO CATCHER STRL LF (MISCELLANEOUS) ×3 IMPLANT
CATH FOLEY 3WAY 30CC 24FR (CATHETERS) ×2
CATH HEMA 3WAY 30CC 22FR COUDE (CATHETERS) IMPLANT
CATH URTH STD 24FR FL 3W 2 (CATHETERS) ×1 IMPLANT
ELECT REM PT RETURN 9FT ADLT (ELECTROSURGICAL) ×3
ELECTRODE REM PT RTRN 9FT ADLT (ELECTROSURGICAL) ×1 IMPLANT
GLOVE BIOGEL M STRL SZ7.5 (GLOVE) ×3 IMPLANT
GOWN STRL REUS W/TWL LRG LVL3 (GOWN DISPOSABLE) ×3 IMPLANT
GOWN STRL REUS W/TWL XL LVL3 (GOWN DISPOSABLE) ×3 IMPLANT
HOLDER FOLEY CATH W/STRAP (MISCELLANEOUS) ×3 IMPLANT
KIT BASIN OR (CUSTOM PROCEDURE TRAY) ×3 IMPLANT
LOOP CUT BIPOLAR 24F LRG (ELECTROSURGICAL) ×3 IMPLANT
MANIFOLD NEPTUNE II (INSTRUMENTS) ×3 IMPLANT
PACK CYSTO (CUSTOM PROCEDURE TRAY) ×3 IMPLANT
SYR 30ML LL (SYRINGE) ×3 IMPLANT
SYRINGE IRR TOOMEY STRL 70CC (SYRINGE) ×3 IMPLANT
TUBING CONNECTING 10 (TUBING) ×2 IMPLANT
TUBING CONNECTING 10' (TUBING) ×1

## 2016-05-12 NOTE — Anesthesia Preprocedure Evaluation (Addendum)
Anesthesia Evaluation  Patient identified by MRN, date of birth, ID band Patient awake    Reviewed: Allergy & Precautions, H&P , NPO status , Patient's Chart, lab work & pertinent test results  Airway Mallampati: III  TM Distance: >3 FB Neck ROM: Full    Dental no notable dental hx. (+) Teeth Intact, Dental Advisory Given   Pulmonary neg pulmonary ROS, former smoker,    Pulmonary exam normal breath sounds clear to auscultation       Cardiovascular hypertension, Pt. on medications  Rhythm:Regular Rate:Normal     Neuro/Psych negative neurological ROS  negative psych ROS   GI/Hepatic Neg liver ROS, GERD  Medicated and Controlled,  Endo/Other  negative endocrine ROS  Renal/GU negative Renal ROS  negative genitourinary   Musculoskeletal   Abdominal   Peds  Hematology negative hematology ROS (+)   Anesthesia Other Findings   Reproductive/Obstetrics negative OB ROS                            Anesthesia Physical Anesthesia Plan  ASA: II  Anesthesia Plan: General   Post-op Pain Management:    Induction: Intravenous  Airway Management Planned: LMA  Additional Equipment:   Intra-op Plan:   Post-operative Plan: Extubation in OR  Informed Consent: I have reviewed the patients History and Physical, chart, labs and discussed the procedure including the risks, benefits and alternatives for the proposed anesthesia with the patient or authorized representative who has indicated his/her understanding and acceptance.   Dental advisory given  Plan Discussed with: CRNA  Anesthesia Plan Comments:         Anesthesia Quick Evaluation

## 2016-05-12 NOTE — H&P (Signed)
Dustin Ramirez is an 75 y.o. male.    Chief Complaint: Clot Urinary Retention  HPI:   1 - Clot Urinary Retention - long h/o obstructive urinary symptoms s/p prior TURP x2, most recently 01/2016 found to be in urinary retention earlier this week with large hematuria / clots. On fish oil at baseline, no strong blood thinners. Had foley placed at Urol office, but returned next day  with recurrent retention requiring clot irrigation. Denies fevers. History of prostate cancer s/p brachythearpy. He normally follows with Dr. Mady Haagensen who is out of town.   Today "Dustin Ramirez" is seen as direct admission for cysto and clot evacuation. NPO since yesterday.   Past Medical History:  Diagnosis Date  . BPH (benign prostatic hyperplasia)   . ED (erectile dysfunction)   . Foley catheter in place   . GERD (gastroesophageal reflux disease)   . Hypertension   . Prostate cancer [facility]) urologist-  dr Karsten Ro   dx 2015  s/p  radiative prostate seed implants  05-08-2014  . Urinary retention   . Wears contact lenses     Past Surgical History:  Procedure Laterality Date  . CYSTOSCOPY N/A 08/17/2014   Procedure: CYSTOSCOPY;  Surgeon: [name], MD;  Location: [facility];  Service: Urology;  Laterality: N/A;  . INSERTION OF SUPRAPUBIC CATHETER N/A 08/17/2014   Procedure: INSERTION OF SUPRAPUBIC CATHETER;  Surgeon: [name], MD;  Location: [facility];  Service: Urology;  Laterality: N/A;  . PROSTATE BIOPSY    . RADIOACTIVE SEED IMPLANT N/A 05/08/2014   Procedure: RADIOACTIVE SEED IMPLANT, cystoscopy;  Surgeon: [name], MD;  Location: [facility];  Service: Urology;  Laterality: N/A;  dr portable   . TRANSURETHRAL RESECTION OF PROSTATE N/A 09/06/2015   Procedure: TRANSURETHRAL RESECTION OF THE PROSTATE WITH GYRUS VERSUS            (TURP) ;  Surgeon: Kathie Rhodes, MD;  Location: [facility];  Service: Urology;  Laterality: N/A;  . TRANSURETHRAL  RESECTION OF PROSTATE N/A 01/21/2016   Procedure: TRANSURETHRAL RESECTION OF THE PROSTATE WITH GYRUS INSTRUMENTS;  Surgeon: Kathie Rhodes, MD;  Location: [place];  Service: Urology;  Laterality: N/A;    Family History  Problem Relation Age of Onset  . Cancer Mother     colon  . Cancer Father     lung  . Diabetes Brother    Social History:  reports that he quit smoking about 53 years ago. His smoking use included Cigarettes. He quit after 1.00 year of use. He has never used smokeless tobacco. He reports that he drinks about 8.4 oz of alcohol per week . He reports that he does not use drugs.  Allergies:  Allergies  Allergen Reactions  . Penicillins Anaphylaxis    Medications Prior to Admission  Medication Sig Dispense Refill  . acetaminophen (TYLENOL) 500 MG tablet Take 500 mg by mouth every 6 (six) hours as needed.    [name] aspirin EC 81 MG tablet Take 81 mg by mouth daily.    . calcium carbonate (TUMS) 500 MG chewable tablet Chew 1 tablet by mouth as needed for indigestion or heartburn.    . docusate sodium (COLACE) 100 MG capsule Take 100 mg by mouth daily.     [name] lisinopril (PRINIVIL,ZESTRIL) 10 MG tablet Take 10 mg by mouth every morning.     . Oxycodone HCl 10 MG TABS Take 1 tablet (10 mg total) by mouth  every 4 (four) hours as needed. 30 tablet 0  . phenazopyridine (PYRIDIUM) 200 MG tablet Take 1 tablet (200 mg total) by mouth 3 (three) times daily as needed for pain. 30 tablet 0  . vitamin E 400 UNIT capsule Take 400 Units by mouth daily.      No results found for this or any previous visit (from the past 48 hour(s)). No results found.  Review of Systems  Constitutional: Negative for chills and fever.  HENT: Negative.   Eyes: Negative.   Respiratory: Negative.   Cardiovascular: Negative.   Gastrointestinal: Negative.   Genitourinary: Positive for dysuria and hematuria.  Musculoskeletal: Negative.   Skin: Negative.   Neurological: Negative.    Endo/Heme/Allergies: Negative.   Psychiatric/Behavioral: Negative.     There were no vitals taken for this visit. Physical Exam  Constitutional: He appears well-developed.  HENT:  Head: Normocephalic.  Eyes: Pupils are equal, round, and reactive to light.  Neck: Normal range of motion.  Cardiovascular: Normal rate.   Respiratory: Effort normal.  GI: Soft.  Genitourinary:  Genitourinary Comments: Foley in place with dark bloody urine with some clots  Musculoskeletal: Normal range of motion.  Skin: Skin is warm.  Psychiatric: He has a normal mood and affect. His behavior is normal. Judgment and thought content normal.     Assessment/Plan  1 - Clot Urinary Retention - DDX prostatic source v. Radiation cystitis. Given recurrent clot retention rec urgent cysto and clot evacuation / fulgeration with goal of removing clot, identifying bleeding source, and controlling. Admit post-op with goal or clear urine off irrigation before DC home.   Alexis Frock, MD 05/12/2016, 1:07 PM

## 2016-05-12 NOTE — Brief Op Note (Signed)
05/12/2016  5:27 PM  PATIENT:  Dustin Ramirez  75 y.o. male  PRE-OPERATIVE DIAGNOSIS:  clot retention history of prostate  POST-OPERATIVE DIAGNOSIS:  clot retention history of prostate  PROCEDURE:  Procedure(s): Cystoscopy clot evacuation and fulgration (N/A)  SURGEON:  Surgeon(s) and Role:    * Alexis Frock, MD - Primary  PHYSICIAN ASSISTANT:   ASSISTANTS: none   ANESTHESIA:   general  EBL:  Total I/O In: -  Out: 50 [Blood:50]  BLOOD ADMINISTERED:none  DRAINS: 70F 3 way foley to NS irrigation   LOCAL MEDICATIONS USED:  NONE  SPECIMEN:  No Specimen  DISPOSITION OF SPECIMEN:  N/A  COUNTS:  YES  TOURNIQUET:  * No tourniquets in log *  DICTATION: .Other Dictation: Dictation Number [id number]  PLAN OF CARE: Admit for overnight observation  PATIENT DISPOSITION:  PACU - hemodynamically stable.   Delay start of Pharmacological VTE agent (>24hrs) due to surgical blood loss or risk of bleeding: yes

## 2016-05-12 NOTE — Transfer of Care (Signed)
Immediate Anesthesia Transfer of Care Note  Patient: Dustin Ramirez  Procedure(s) Performed: Procedure(s): Cystoscopy clot evacuation and fulgration (N/A)  Patient Location: PACU  Anesthesia Type:General  Level of Consciousness: awake, alert  and oriented  Airway & Oxygen Therapy: Patient Spontanous Breathing and Patient connected to face mask oxygen  Post-op Assessment: Report given to RN and Post -op Vital signs reviewed and stable  Post vital signs: Reviewed and stable  Last Vitals:  Vitals:   05/12/16 1320  BP: 120/70  Pulse: 95  Resp: 20  Temp: 36.7 C    Last Pain:  Vitals:   05/12/16 1320  TempSrc: Oral         Complications: No apparent anesthesia complications

## 2016-05-12 NOTE — Anesthesia Postprocedure Evaluation (Signed)
Anesthesia Post Note  Patient: Dustin Ramirez  Procedure(s) Performed: Procedure(s) (LRB): Cystoscopy clot evacuation and fulgration (N/A)  Patient location during evaluation: PACU Anesthesia Type: General Level of consciousness: awake and alert Pain management: pain level controlled Vital Signs Assessment: post-procedure vital signs reviewed and stable Respiratory status: spontaneous breathing, nonlabored ventilation, respiratory function stable and patient connected to nasal cannula oxygen Cardiovascular status: blood pressure returned to baseline and stable Postop Assessment: no signs of nausea or vomiting Anesthetic complications: no    Last Vitals:  Vitals:   05/12/16 1815 05/12/16 1827  BP: (!) 143/76   Pulse: 88   Resp: 11   Temp:  36.8 C    Last Pain:  Vitals:   05/12/16 1827  TempSrc:   PainSc: 3                  Earle Burson,W. EDMOND

## 2016-05-12 NOTE — Op Note (Signed)
[name], Ramirez                 ACCOUNT NO.:  [id number]  MEDICAL RECORD NO.:  [id number]  LOCATION:  [id number]                         [facility]ACILITY[facility]  [facility]  PHYSICIAN:  Alexis Frock, MD     DATE OF BIRTH:  1941/08/27  DATE OF PROCEDURE: 05/12/2016                               OPERATIVE REPORT   DIAGNOSIS:  Recurrent hematuria with clot retention, history of prostate cancer and prostatic hypertrophy.  PROCEDURE:  Cystoscopy with clot evacuation and fulguration of bleeders.  ESTIMATED BLOOD LOSS:  Approximately 300 mL of old formed clot, minimal new blood.  FINDINGS: 1. Single small mucosal bleeder from the 6 o'clock bladder neck area. 2. Complete resolution of all clots and active bleeding following     fulguration and clot evacuation.  DRAINS:  A 22-French 3-way Foley catheter to normal saline irrigation, Efflux light pink and 3 drops per second.  INDICATION:  [name] is a pleasant, 75 year old gentleman with history of adenocarcinoma of the prostate.  He is status post brachytherapy.  He has also had obstructive problems and status post transient resection of prostate x2 previously.  He presented to our office earlier this week with gross hematuria and frank urinary retention.  He underwent hand irrigation at that time.  He returned to the office approximately a day later with recurrent retention that was refractory to hand irrigation. So, the most prudent means of management would be operative evaluation with cystoscopy, clot evacuation, and possible fulguration of bleeders. Informed consent was obtained and placed in the medical record.  DESCRIPTION OF PROCEDURE:  The patient being [place] verified, procedures being cysto, clot evacuation, and fulguration were confirmed. Procedure was carried out.  Time-out was performed.  Intravenous antibiotics were administered.  General anesthesia introduced.  The patient was placed into a low lithotomy position.  Sterile field  was created by prepping and draping the patient's penis, perineum, and proximal thighs using iodine x3.  Next, cystourethroscopy was performed using a 26-French resectoscope sheath with visual obturator.  Inspection of the anterior and posterior urethra revealed only some formed clot at the bladder neck.  Inspection of bladder revealed large volume formed clot.  A Toomey syringe was then used to perform clot evacuation, approximately 300 mL of old clot was evacuated via the urinary bladder. Repeat cystoscopic exam revealed complete resolution of all old clots. There was a single punctate mucosal bleeder at the 6 o'clock position in the bladder neck.  As such, a loop resectoscope was used with bipolar energy to fulgurate this mucosal bleeder.  There was some additional mucosal erythema of the prostatic fossa.  This was fulgurated as well. Following these maneuvers, there was complete hemostasis, there was complete evacuation of all clot.  Ureteral orifices were inspected and found to be uninjured.  There was no evidence of bladder perforation. Resectoscope was exchanged for a new 22-French 3-way Foley catheter, 15 mL of sterile water in the balloon.  This connected to normal saline irrigation, efflux essentially clear, 3 drops per second, and the procedure was terminated.  The patient tolerated the procedure well.  There were no immediate periprocedural indications.  The patient was taken to the postanesthesia care unit  in stable condition.          ______________________________ Alexis Frock, MD     TM/MEDQ  D:  05/12/2016  T:  05/12/2016  Job:  [id number]

## 2016-05-12 NOTE — Anesthesia Procedure Notes (Signed)
Procedure Name: LMA Insertion Date/Time: 05/12/2016 5:01 PM Performed by: Danley Danker L Pre-anesthesia Checklist: Patient identified Patient Re-evaluated:Patient Re-evaluated prior to inductionOxygen Delivery Method: Circle system utilized Preoxygenation: Pre-oxygenation with 100% oxygen Intubation Type: IV induction Ventilation: Mask ventilation without difficulty LMA: LMA inserted LMA Size: 4.0 Number of attempts: 1 Placement Confirmation: positive ETCO2 and breath sounds checked- equal and bilateral Tube secured with: Tape Dental Injury: Teeth and Oropharynx as per pre-operative assessment

## 2016-05-13 DIAGNOSIS — Z79899 Other long term (current) drug therapy: Secondary | ICD-10-CM | POA: Diagnosis not present

## 2016-05-13 DIAGNOSIS — Z8546 Personal history of malignant neoplasm of prostate: Secondary | ICD-10-CM | POA: Diagnosis not present

## 2016-05-13 DIAGNOSIS — K219 Gastro-esophageal reflux disease without esophagitis: Secondary | ICD-10-CM | POA: Diagnosis not present

## 2016-05-13 DIAGNOSIS — Z87891 Personal history of nicotine dependence: Secondary | ICD-10-CM | POA: Diagnosis not present

## 2016-05-13 DIAGNOSIS — Z9079 Acquired absence of other genital organ(s): Secondary | ICD-10-CM | POA: Diagnosis not present

## 2016-05-13 DIAGNOSIS — I10 Essential (primary) hypertension: Secondary | ICD-10-CM | POA: Diagnosis not present

## 2016-05-13 DIAGNOSIS — N029 Recurrent and persistent hematuria with unspecified morphologic changes: Secondary | ICD-10-CM | POA: Diagnosis not present

## 2016-05-13 DIAGNOSIS — N401 Enlarged prostate with lower urinary tract symptoms: Secondary | ICD-10-CM | POA: Diagnosis not present

## 2016-05-13 DIAGNOSIS — R338 Other retention of urine: Secondary | ICD-10-CM | POA: Diagnosis not present

## 2016-05-13 LAB — BASIC METABOLIC PANEL
Anion gap: 4 — ABNORMAL LOW (ref 5–15)
BUN: 10 mg/dL (ref 6–20)
CO2: 28 mmol/L (ref 22–32)
Calcium: 9.1 mg/dL (ref 8.9–10.3)
Chloride: 102 mmol/L (ref 101–111)
Creatinine, Ser: 1.06 mg/dL (ref 0.61–1.24)
GFR calc Af Amer: >60 mL/min (ref >60)
GLUCOSE: 150 mg/dL — AB (ref 65–99)
POTASSIUM: 4.5 mmol/L (ref 3.5–5.1)
Sodium: 134 mmol/L — ABNORMAL LOW (ref 135–145)

## 2016-05-13 LAB — HEMOGLOBIN AND HEMATOCRIT, BLOOD
HEMATOCRIT: 33.7 % — AB (ref 39.0–52.0)
HEMOGLOBIN: 11.4 g/dL — AB (ref 13.0–17.0)

## 2016-05-13 MED ORDER — SULFAMETHOXAZOLE-TRIMETHOPRIM 800-160 MG PO TABS: 1.0000 | ORAL_TABLET | Freq: Two times a day (BID) | ORAL | 0 refills | Status: AC

## 2016-05-13 NOTE — Discharge Instructions (Signed)
1 - You may have urinary urgency (bladder spasms) and bloody urine on / off x few days. This is normal. ° °2 - Call MD or go to ER for fever >102, severe pain / nausea / vomiting not relieved by medications, or acute change in medical status ° °

## 2016-05-13 NOTE — Discharge Summary (Signed)
Physician Discharge Summary  Patient ID: Dustin Ramirez MRN: [id number] DOB/AGE: [birth year] 75 y.o.  Admit date: 05/12/2016 Discharge date: 05/13/2016  Admission Diagnoses: Clot urinary Retention  Discharge Diagnoses:  Active Problems:   Clot retention of urine   Discharged Condition: good  Hospital Course:   1 - Clot Urinary Retention - pt direct admitted from Urology office 8/11 for refractory clot retention. Underwent urgent cysto / clot evacuation / fulguration at which point a single mucosal vessel at 6 O clock position of bladder neck - prostate junction was pulsatile bleeding and then cauterized. He was managed on slow bladder irrigation overnight which was stopped on AM of POD 1. By PM of POD 1 he is ambulatory, urine completely clear off irrigation, pain controlled on PO meds, and felt to be adequate for discharge.   Consults: None  Significant Diagnostic Studies: labs: Hgb 11.4, Cr 1.06 on day of discharge.  Treatments: surgery:  urgent cysto / clot evacuation / fulguration  Discharge Exam: Blood pressure (!) 103/51, pulse 71, temperature 98.9 F (37.2 C), temperature source Oral, resp. rate 18, weight 85.8 kg (189 lb 2.5 oz), SpO2 100 %. General appearance: alert, cooperative, appears stated age and family at bedside Eyes: negative Nose: Nares normal. Septum midline. Mucosa normal. No drainage or sinus tenderness. Throat: lips, mucosa, and tongue normal; teeth and gums normal Neck: no adenopathy, no carotid bruit, no JVD, supple, symmetrical, trachea midline and thyroid not enlarged, symmetric, no tenderness/mass/nodules Back: symmetric, no curvature. ROM normal. No CVA tenderness. Resp: non-labored on room air Cardio: Nl rate GI: soft, non-tender; bowel sounds normal; no masses,  no organomegaly Male genitalia: normal, 3 day foley in place off irrigation with efflux of clear yellow urine. No clots / blood whatsoever.  Extremities: extremities normal, atraumatic, no  cyanosis or edema Skin: Skin color, texture, turgor normal. No rashes or lesions Lymph nodes: Cervical, supraclavicular, and axillary nodes normal. Neurologic: Grossly normal  Disposition: 01-Home or Self Care     Medication List    TAKE these medications   calcium carbonate 500 MG chewable tablet Commonly known as:  TUMS - dosed in mg elemental calcium Chew 2 tablets by mouth as needed for indigestion or heartburn.   docusate sodium 100 MG capsule Commonly known as:  COLACE Take 100 mg by mouth daily.   lisinopril 10 MG tablet Commonly known as:  PRINIVIL,ZESTRIL Take 10 mg by mouth daily.   milk thistle 175 MG tablet Take 175 mg by mouth daily.   omega-3 acid ethyl esters 1 g capsule Commonly known as:  LOVAZA Take 1 g by mouth daily.   Oxycodone HCl 10 MG Tabs Take 1 tablet (10 mg total) by mouth every 4 (four) hours as needed.   phenazopyridine 200 MG tablet Commonly known as:  PYRIDIUM Take 1 tablet (200 mg total) by mouth 3 (three) times daily as needed for pain.   sulfamethoxazole-trimethoprim 800-160 MG tablet Commonly known as:  BACTRIM DS,SEPTRA DS Take 1 tablet by mouth 2 (two) times daily. X 3 days. Begin day before next Urology appointment. What changed:  additional instructions      [facility] .   Why:  as scheduled Contact information: [address] [place] [address] [phone]           Signed: Alexis Frock 05/13/2016, 2:39 PM

## 2016-05-13 NOTE — Care Management Note (Signed)
Case Management Note  Patient Details  Name: Dustin Ramirez MRN: [id number] Date of Birth: 1940-10-23  Subjective/Objective:      urgent cysto/clot evacuation/fulguration                Action/Plan: Discharge Planning: AVS reviewed  Chart reviewed. No NCM needs identified.   Expected Discharge Date:  05/13/2016             Expected Discharge Plan:  Home/Self Care  In-House Referral:  NA  Discharge planning Services  CM Consult  Post Acute Care Choice:  NA Choice offered to:  NA  DME Arranged:  N/A DME Agency:  NA  HH Arranged:  NA HH Agency:  NA  Status of Service:  Completed, signed off  If discussed at [place] of Stay Meetings, dates discussed:    Additional Comments:  Erenest Rasher, RN 05/13/2016, 3:23 PM

## 2016-05-15 ENCOUNTER — Encounter (HOSPITAL_COMMUNITY): Payer: Self-pay | Admitting: Urology

## 2016-05-16 ENCOUNTER — Encounter (HOSPITAL_COMMUNITY): Payer: Self-pay | Admitting: Emergency Medicine

## 2016-05-16 ENCOUNTER — Emergency Department (HOSPITAL_COMMUNITY)
Admission: EM | Admit: 2016-05-16 | Discharge: 2016-05-17 | Disposition: A | Discharge status: Home / Self Care | Payer: Commercial Managed Care - HMO | Attending: Emergency Medicine | Admitting: Emergency Medicine

## 2016-05-16 DIAGNOSIS — I10 Essential (primary) hypertension: Secondary | ICD-10-CM | POA: Insufficient documentation

## 2016-05-16 DIAGNOSIS — Z8546 Personal history of malignant neoplasm of prostate: Secondary | ICD-10-CM | POA: Insufficient documentation

## 2016-05-16 DIAGNOSIS — R338 Other retention of urine: Secondary | ICD-10-CM | POA: Diagnosis not present

## 2016-05-16 DIAGNOSIS — R339 Retention of urine, unspecified: Secondary | ICD-10-CM

## 2016-05-16 DIAGNOSIS — Z79899 Other long term (current) drug therapy: Secondary | ICD-10-CM | POA: Diagnosis not present

## 2016-05-16 DIAGNOSIS — Z87891 Personal history of nicotine dependence: Secondary | ICD-10-CM | POA: Insufficient documentation

## 2016-05-16 NOTE — ED Provider Notes (Signed)
[name] Note   CSN: [id number] Arrival date & time: 05/16/16  2231  By signing my name below, I, Dustin Ramirez, attest that this documentation has been prepared under the direction and in the presence of Dustin Fuel, MD . Electronically Signed: Royce Ramirez, [place]. 05/16/2016. 12:10 AM.  History   Chief Complaint Chief Complaint  Patient presents with  . Urinary Retention   The history is provided by the patient. No language interpreter was used.     HPI Comments:  Dustin Ramirez is a 75 y.o. male who presents to the Emergency Department complaining of urinary retention beginning at 1130 today.  He was hospitalized 5 days ago and had a catheter placed.  He states he had a catheter removed this morning.  He got home at 11:30 and drank 8 bottles of water over the course of the day.  He states he did not urinate since returning home.  No additional complaints or alleviating factors noted.     Past Medical History:  Diagnosis Date  . BPH (benign prostatic hyperplasia)   . ED (erectile dysfunction)   . Foley catheter in place   . GERD (gastroesophageal reflux disease)   . Hypertension   . Prostate cancer Dustin Ramirez) urologist-  dr Karsten Ro   dx 2015  s/p  radiative prostate seed implants  05-08-2014  . Urinary retention   . Wears contact lenses     Patient Active Problem List   Diagnosis Date Noted  . Clot retention of urine 05/12/2016  . Malignant neoplasm of prostate ([place]) 03/09/2014  . HTN (hypertension) 03/09/2014    Past Surgical History:  Procedure Laterality Date  . CYSTOSCOPY N/A 08/17/2014   Procedure: CYSTOSCOPY;  Surgeon: [name], MD;  Location: [facility];  Service: Urology;  Laterality: N/A;  . INSERTION OF SUPRAPUBIC CATHETER N/A 08/17/2014   Procedure: INSERTION OF SUPRAPUBIC CATHETER;  Surgeon: [name], MD;  Location: [facility];  Service: Urology;  Laterality: N/A;  . PROSTATE BIOPSY      . RADIOACTIVE SEED IMPLANT N/A 05/08/2014   Procedure: RADIOACTIVE SEED IMPLANT, cystoscopy;  Surgeon: [name], MD;  Location: [facility];  Service: Urology;  Laterality: N/A;  dr portable   . TRANSURETHRAL RESECTION OF PROSTATE N/A 09/06/2015   Procedure: TRANSURETHRAL RESECTION OF THE PROSTATE WITH GYRUS VERSUS            (TURP) ;  Surgeon: Kathie Rhodes, MD;  Location[facility];  Service: Urology;  Laterality: N/A;  . TRANSURETHRAL RESECTION OF PROSTATE N/A 01/21/2016   Procedure: TRANSURETHRAL RESECTION OF THE PROSTATE WITH GYRUS INSTRUMENTS;  Surgeon: Kathie Rhodes, MD;  Location: [facility];  Service: Urology;  Laterality: N/A;  . TRANSURETHRAL RESECTION OF PROSTATE N/A 05/12/2016   Procedure: Cystoscopy clot evacuation and fulgration;  Surgeon: Alexis Frock, MD;  Location: [facility];  Service: Urology;  Laterality: N/A;       Home Medications    Prior to Admission medications   Medication Sig Start Date End Date Taking? Authorizing Provider  calcium carbonate (TUMS - DOSED IN MG ELEMENTAL CALCIUM) 500 MG chewable tablet Chew 2 tablets by mouth as needed for indigestion or heartburn.    Historical Provider, MD  docusate sodium (COLACE) 100 MG capsule Take 100 mg by mouth daily.     Historical Provider, MD  lisinopril (PRINIVIL,ZESTRIL) 10 MG tablet Take 10 mg by mouth daily.     Historical Provider, MD  milk thistle 175 MG tablet Take 175 mg by mouth daily.    Historical Provider, MD  omega-3 acid ethyl esters (LOVAZA) 1 g capsule Take 1 g by mouth daily.    Historical Provider, MD  Oxycodone HCl 10 MG TABS Take 1 tablet (10 mg total) by mouth every 4 (four) hours as needed. Patient not taking: Reported on 05/12/2016 01/21/16   Kathie Rhodes, MD  phenazopyridine (PYRIDIUM) 200 MG tablet Take 1 tablet (200 mg total) by mouth 3 (three) times daily as needed for pain. Patient not taking: Reported on 05/12/2016 01/21/16   Kathie Rhodes, MD   sulfamethoxazole-trimethoprim (BACTRIM DS,SEPTRA DS) 800-160 MG tablet Take 1 tablet by mouth 2 (two) times daily. X 3 days. Begin day before next Urology appointment. 05/13/16 05/20/16  Alexis Frock, MD    Family History Family History  Problem Relation Age of Onset  . Cancer Mother     colon  . Cancer Father     lung  . Diabetes Brother     Social History Social History  Substance Use Topics  . Smoking status: Former Smoker    Years: 1.00    Types: Cigarettes    Quit date: 05/02/1963  . Smokeless tobacco: Never Used  . Alcohol use 8.4 oz/week    14 Glasses of wine per week     Comment: 2 glasses wine per night     Allergies   Penicillins   Review of Systems Review of Systems  Constitutional: Negative for fever.  Genitourinary: Positive for difficulty urinating.  All other systems reviewed and are negative.    Physical Exam Updated Vital Signs BP 120/62 (BP Location: Right Arm)   Pulse (!) 123   Temp 99.6 F (37.6 C)   Resp 18   SpO2 100%   Physical Exam  Constitutional: He is oriented to person, place, and time. He appears well-developed and well-nourished.  HENT:  Head: Normocephalic and atraumatic.  Eyes: EOM are normal. Pupils are equal, round, and reactive to light.  Neck: Normal range of motion. Neck supple. No JVD present.  Cardiovascular: Normal rate, regular rhythm and normal heart sounds.   No murmur heard. Pulmonary/Chest: Effort normal and breath sounds normal. He has no wheezes. He has no rales. He exhibits no tenderness.  Abdominal: Soft. Bowel sounds are normal. He exhibits no distension and no mass. There is no tenderness.  Genitourinary:  Genitourinary Comments: Full length catheter in place draining clear urine.   Musculoskeletal: Normal range of motion. He exhibits no edema.  Lymphadenopathy:    He has no cervical adenopathy.  Neurological: He is alert and oriented to person, place, and time. No cranial nerve deficit. He exhibits  normal muscle tone. Coordination normal.  Skin: Skin is warm and dry. No rash noted.  Psychiatric: He has a normal mood and affect. His behavior is normal. Judgment and thought content normal.  Nursing note and vitals reviewed.    ED Treatments / Results   DIAGNOSTIC STUDIES:  Oxygen Saturation is 100% on RA, nml by my interpretation.    COORDINATION OF CARE:  12:10 AM Discussed treatment plan with pt at bedside and pt agreed to plan.   Labs (all labs ordered are listed, but only abnormal results are displayed) Labs Reviewed  URINALYSIS, ROUTINE W REFLEX MICROSCOPIC (NOT AT [facility]) - Abnormal; Notable for the following:       Result Value   APPearance CLOUDY (*)    Hgb urine dipstick LARGE (*)    Protein, ur  30 (*)    Leukocytes, UA SMALL (*)    All other components within normal limits  URINE MICROSCOPIC-ADD ON - Abnormal; Notable for the following:    Squamous Epithelial / LPF 0-5 (*)    Bacteria, UA FEW (*)    All other components within normal limits  URINE CULTURE    EKG  Procedures Procedures (including critical care time)  Medications Ordered in ED Medications - No data to display   Initial Impression / Assessment and Plan / ED Course  I have reviewed the triage vital signs and the nursing notes.  Pertinent labs & imaging results that were available during my care of the patient were reviewed by me and considered in my medical decision making (see chart for details).  Clinical Course    Acute urinary retention following removal of Foley catheter. A catheter was placed prior to my seeing the patient, and it drained 1200 mL. Urine shows no sign of infection, but was sent for culture. He is referred back to his urologist for management. Old records are reviewed, and he had cystoscopy for evacuation of clots and fulguration of bleeding sites in the bladder performed on August 11.  Final Clinical Impressions(s) / ED Diagnoses   Final diagnoses:  Urinary  retention    New Prescriptions New Prescriptions   No medications on file   I personally performed the services described in this documentation, which was scribed in my presence. The recorded information has been reviewed and is accurate.       Dustin Fuel, MD [id number]

## 2016-05-16 NOTE — ED Triage Notes (Signed)
Pt states he had a urinary catheter removed this morning and has not been able to void since 1030 am  Pt states he has a lot of pressure and is very uncomfortable

## 2016-05-16 NOTE — ED Notes (Signed)
Bladder scan <1055ml

## 2016-05-17 LAB — URINALYSIS, ROUTINE W REFLEX MICROSCOPIC
Bilirubin Urine: NEGATIVE
GLUCOSE, UA: NEGATIVE mg/dL
Ketones, ur: NEGATIVE mg/dL
Nitrite: NEGATIVE
PROTEIN: 30 mg/dL — AB
SPECIFIC GRAVITY, URINE: 1.011 (ref 1.005–1.030)
pH: 5.5 (ref 5.0–8.0)

## 2016-05-17 LAB — URINE MICROSCOPIC-ADD ON

## 2016-05-17 NOTE — ED Notes (Signed)
Pt cath changed over to leg bag per pt request

## 2016-05-18 LAB — URINE CULTURE: Culture: <10000 — AB

## 2016-05-23 DIAGNOSIS — R338 Other retention of urine: Secondary | ICD-10-CM | POA: Diagnosis not present

## 2016-06-08 DIAGNOSIS — R338 Other retention of urine: Secondary | ICD-10-CM | POA: Diagnosis not present

## 2016-06-09 DIAGNOSIS — R338 Other retention of urine: Secondary | ICD-10-CM | POA: Diagnosis not present

## 2016-07-11 DIAGNOSIS — Z23 Encounter for immunization: Secondary | ICD-10-CM | POA: Diagnosis not present

## 2016-07-25 DIAGNOSIS — R330 Drug induced retention of urine: Secondary | ICD-10-CM | POA: Diagnosis not present

## 2016-07-25 DIAGNOSIS — R338 Other retention of urine: Secondary | ICD-10-CM | POA: Diagnosis not present

## 2016-07-25 DIAGNOSIS — R972 Elevated prostate specific antigen [PSA]: Secondary | ICD-10-CM | POA: Diagnosis not present

## 2016-07-25 DIAGNOSIS — C61 Malignant neoplasm of prostate: Secondary | ICD-10-CM | POA: Diagnosis not present

## 2016-11-06 DIAGNOSIS — N3000 Acute cystitis without hematuria: Secondary | ICD-10-CM | POA: Diagnosis not present

## 2016-12-01 DIAGNOSIS — N3000 Acute cystitis without hematuria: Secondary | ICD-10-CM | POA: Diagnosis not present

## 2016-12-01 DIAGNOSIS — N3942 Incontinence without sensory awareness: Secondary | ICD-10-CM | POA: Diagnosis not present

## 2016-12-12 DIAGNOSIS — R350 Frequency of micturition: Secondary | ICD-10-CM | POA: Diagnosis not present

## 2016-12-12 DIAGNOSIS — N3942 Incontinence without sensory awareness: Secondary | ICD-10-CM | POA: Diagnosis not present

## 2017-01-11 DIAGNOSIS — H26493 Other secondary cataract, bilateral: Secondary | ICD-10-CM | POA: Diagnosis not present

## 2017-01-23 DIAGNOSIS — N393 Stress incontinence (female) (male): Secondary | ICD-10-CM | POA: Diagnosis not present

## 2017-01-23 DIAGNOSIS — R8279 Other abnormal findings on microbiological examination of urine: Secondary | ICD-10-CM | POA: Diagnosis not present

## 2017-02-08 DIAGNOSIS — M6281 Muscle weakness (generalized): Secondary | ICD-10-CM | POA: Diagnosis not present

## 2017-02-08 DIAGNOSIS — M62838 Other muscle spasm: Secondary | ICD-10-CM | POA: Diagnosis not present

## 2017-02-08 DIAGNOSIS — R338 Other retention of urine: Secondary | ICD-10-CM | POA: Diagnosis not present

## 2017-02-08 DIAGNOSIS — N3946 Mixed incontinence: Secondary | ICD-10-CM | POA: Diagnosis not present

## 2017-02-27 DIAGNOSIS — M62838 Other muscle spasm: Secondary | ICD-10-CM | POA: Diagnosis not present

## 2017-02-27 DIAGNOSIS — N3946 Mixed incontinence: Secondary | ICD-10-CM | POA: Diagnosis not present

## 2017-02-27 DIAGNOSIS — M6281 Muscle weakness (generalized): Secondary | ICD-10-CM | POA: Diagnosis not present

## 2017-03-27 DIAGNOSIS — Z79899 Other long term (current) drug therapy: Secondary | ICD-10-CM | POA: Diagnosis not present

## 2017-03-27 DIAGNOSIS — Z0000 Encounter for general adult medical examination without abnormal findings: Secondary | ICD-10-CM | POA: Diagnosis not present

## 2017-03-27 DIAGNOSIS — L723 Sebaceous cyst: Secondary | ICD-10-CM | POA: Diagnosis not present

## 2017-03-27 DIAGNOSIS — E784 Other hyperlipidemia: Secondary | ICD-10-CM | POA: Diagnosis not present

## 2017-03-27 DIAGNOSIS — Z6825 Body mass index (BMI) 25.0-25.9, adult: Secondary | ICD-10-CM | POA: Diagnosis not present

## 2017-04-03 DIAGNOSIS — R748 Abnormal levels of other serum enzymes: Secondary | ICD-10-CM | POA: Diagnosis not present

## 2017-04-10 DIAGNOSIS — R7989 Other specified abnormal findings of blood chemistry: Secondary | ICD-10-CM | POA: Diagnosis not present

## 2017-04-10 DIAGNOSIS — R945 Abnormal results of liver function studies: Secondary | ICD-10-CM | POA: Diagnosis not present

## 2017-04-12 DIAGNOSIS — N3946 Mixed incontinence: Secondary | ICD-10-CM | POA: Diagnosis not present

## 2017-04-12 DIAGNOSIS — M62838 Other muscle spasm: Secondary | ICD-10-CM | POA: Diagnosis not present

## 2017-04-12 DIAGNOSIS — R338 Other retention of urine: Secondary | ICD-10-CM | POA: Diagnosis not present

## 2017-04-12 DIAGNOSIS — M6281 Muscle weakness (generalized): Secondary | ICD-10-CM | POA: Diagnosis not present

## 2017-05-01 DIAGNOSIS — L723 Sebaceous cyst: Secondary | ICD-10-CM | POA: Insufficient documentation

## 2017-05-01 DIAGNOSIS — L720 Epidermal cyst: Secondary | ICD-10-CM | POA: Diagnosis not present

## 2017-05-03 DIAGNOSIS — M6281 Muscle weakness (generalized): Secondary | ICD-10-CM | POA: Diagnosis not present

## 2017-05-03 DIAGNOSIS — M62838 Other muscle spasm: Secondary | ICD-10-CM | POA: Diagnosis not present

## 2017-05-03 DIAGNOSIS — N3946 Mixed incontinence: Secondary | ICD-10-CM | POA: Diagnosis not present

## 2017-05-24 DIAGNOSIS — N3946 Mixed incontinence: Secondary | ICD-10-CM | POA: Diagnosis not present

## 2017-05-24 DIAGNOSIS — M6281 Muscle weakness (generalized): Secondary | ICD-10-CM | POA: Diagnosis not present

## 2017-05-24 DIAGNOSIS — M62838 Other muscle spasm: Secondary | ICD-10-CM | POA: Diagnosis not present

## 2017-05-29 DIAGNOSIS — Z0000 Encounter for general adult medical examination without abnormal findings: Secondary | ICD-10-CM | POA: Diagnosis not present

## 2017-05-29 DIAGNOSIS — Z1389 Encounter for screening for other disorder: Secondary | ICD-10-CM | POA: Diagnosis not present

## 2017-05-29 DIAGNOSIS — Z9181 History of falling: Secondary | ICD-10-CM | POA: Diagnosis not present

## 2017-05-29 DIAGNOSIS — K7581 Nonalcoholic steatohepatitis (NASH): Secondary | ICD-10-CM | POA: Diagnosis not present

## 2017-05-29 DIAGNOSIS — R945 Abnormal results of liver function studies: Secondary | ICD-10-CM | POA: Diagnosis not present

## 2017-05-29 DIAGNOSIS — M25561 Pain in right knee: Secondary | ICD-10-CM | POA: Diagnosis not present

## 2017-05-29 DIAGNOSIS — Z6825 Body mass index (BMI) 25.0-25.9, adult: Secondary | ICD-10-CM | POA: Diagnosis not present

## 2017-05-29 DIAGNOSIS — L723 Sebaceous cyst: Secondary | ICD-10-CM | POA: Diagnosis not present

## 2017-06-26 DIAGNOSIS — N3946 Mixed incontinence: Secondary | ICD-10-CM | POA: Diagnosis not present

## 2017-06-26 DIAGNOSIS — M6281 Muscle weakness (generalized): Secondary | ICD-10-CM | POA: Diagnosis not present

## 2017-06-26 DIAGNOSIS — R300 Dysuria: Secondary | ICD-10-CM | POA: Diagnosis not present

## 2017-06-26 DIAGNOSIS — R102 Pelvic and perineal pain: Secondary | ICD-10-CM | POA: Diagnosis not present

## 2017-06-26 DIAGNOSIS — M62838 Other muscle spasm: Secondary | ICD-10-CM | POA: Diagnosis not present

## 2017-06-28 DIAGNOSIS — R748 Abnormal levels of other serum enzymes: Secondary | ICD-10-CM | POA: Diagnosis not present

## 2017-07-05 DIAGNOSIS — C61 Malignant neoplasm of prostate: Secondary | ICD-10-CM | POA: Diagnosis not present

## 2017-07-12 DIAGNOSIS — N3946 Mixed incontinence: Secondary | ICD-10-CM | POA: Diagnosis not present

## 2017-07-12 DIAGNOSIS — M62838 Other muscle spasm: Secondary | ICD-10-CM | POA: Diagnosis not present

## 2017-07-12 DIAGNOSIS — M6281 Muscle weakness (generalized): Secondary | ICD-10-CM | POA: Diagnosis not present

## 2017-07-17 DIAGNOSIS — Z23 Encounter for immunization: Secondary | ICD-10-CM | POA: Diagnosis not present

## 2017-07-24 DIAGNOSIS — C61 Malignant neoplasm of prostate: Secondary | ICD-10-CM | POA: Diagnosis not present

## 2017-08-21 DIAGNOSIS — Z8546 Personal history of malignant neoplasm of prostate: Secondary | ICD-10-CM | POA: Diagnosis not present

## 2017-08-21 DIAGNOSIS — N393 Stress incontinence (female) (male): Secondary | ICD-10-CM | POA: Diagnosis not present

## 2017-08-21 DIAGNOSIS — R8279 Other abnormal findings on microbiological examination of urine: Secondary | ICD-10-CM | POA: Diagnosis not present

## 2017-08-28 DIAGNOSIS — M6281 Muscle weakness (generalized): Secondary | ICD-10-CM | POA: Diagnosis not present

## 2017-08-28 DIAGNOSIS — M62838 Other muscle spasm: Secondary | ICD-10-CM | POA: Diagnosis not present

## 2017-08-28 DIAGNOSIS — N3946 Mixed incontinence: Secondary | ICD-10-CM | POA: Diagnosis not present

## 2017-08-28 DIAGNOSIS — R102 Pelvic and perineal pain: Secondary | ICD-10-CM | POA: Diagnosis not present

## 2017-10-29 DIAGNOSIS — Z6825 Body mass index (BMI) 25.0-25.9, adult: Secondary | ICD-10-CM | POA: Diagnosis not present

## 2017-10-29 DIAGNOSIS — L259 Unspecified contact dermatitis, unspecified cause: Secondary | ICD-10-CM | POA: Diagnosis not present

## 2017-12-27 DIAGNOSIS — R8279 Other abnormal findings on microbiological examination of urine: Secondary | ICD-10-CM | POA: Diagnosis not present

## 2017-12-27 DIAGNOSIS — N35011 Post-traumatic bulbous urethral stricture: Secondary | ICD-10-CM | POA: Diagnosis not present

## 2018-01-17 DIAGNOSIS — H26493 Other secondary cataract, bilateral: Secondary | ICD-10-CM | POA: Diagnosis not present

## 2018-02-25 DIAGNOSIS — N3001 Acute cystitis with hematuria: Secondary | ICD-10-CM | POA: Diagnosis not present

## 2018-02-25 DIAGNOSIS — R509 Fever, unspecified: Secondary | ICD-10-CM | POA: Diagnosis not present

## 2018-02-25 DIAGNOSIS — N3091 Cystitis, unspecified with hematuria: Secondary | ICD-10-CM | POA: Diagnosis not present

## 2018-03-05 DIAGNOSIS — N35011 Post-traumatic bulbous urethral stricture: Secondary | ICD-10-CM | POA: Diagnosis not present

## 2018-03-05 DIAGNOSIS — R8279 Other abnormal findings on microbiological examination of urine: Secondary | ICD-10-CM | POA: Diagnosis not present

## 2018-03-05 DIAGNOSIS — R3914 Feeling of incomplete bladder emptying: Secondary | ICD-10-CM | POA: Diagnosis not present

## 2018-03-05 DIAGNOSIS — N3000 Acute cystitis without hematuria: Secondary | ICD-10-CM | POA: Diagnosis not present

## 2018-03-08 ENCOUNTER — Other Ambulatory Visit: Payer: Self-pay | Admitting: Urology

## 2018-03-27 ENCOUNTER — Other Ambulatory Visit: Payer: Self-pay

## 2018-03-27 ENCOUNTER — Encounter (HOSPITAL_BASED_OUTPATIENT_CLINIC_OR_DEPARTMENT_OTHER): Payer: Self-pay | Admitting: *Deleted

## 2018-03-27 NOTE — Progress Notes (Signed)
Spoke w/ pt via phone for pre-op interview.  Npo after mn.  Arrive at 0930.  Needs istat and ekg.

## 2018-03-28 DIAGNOSIS — E785 Hyperlipidemia, unspecified: Secondary | ICD-10-CM | POA: Diagnosis not present

## 2018-03-28 DIAGNOSIS — Z1339 Encounter for screening examination for other mental health and behavioral disorders: Secondary | ICD-10-CM | POA: Diagnosis not present

## 2018-03-28 DIAGNOSIS — Z79899 Other long term (current) drug therapy: Secondary | ICD-10-CM | POA: Diagnosis not present

## 2018-03-28 DIAGNOSIS — Z6825 Body mass index (BMI) 25.0-25.9, adult: Secondary | ICD-10-CM | POA: Diagnosis not present

## 2018-03-28 DIAGNOSIS — Z0000 Encounter for general adult medical examination without abnormal findings: Secondary | ICD-10-CM | POA: Diagnosis not present

## 2018-03-31 NOTE — H&P (Signed)
HPI: Dustin Ramirez is a 77 year-old male with a history of urethral stricture disease.  His prostate cancer was diagnosed 02/17/2014. His PSA at his time of diagnosis was 6.2. His cancer was T1c, Gleason 3+3 = 6 in both lobes 5/12 cores positive.[name]   He received radiation therapy for his cancer. He was treated with Brachytherapy for his cancer. His radiation treatment was complete 05/08/2014. He has not undergone Hormonal Therapy for treatment.   His PSA results have been low since his prostate cancer treatment.   He does have urinary incontinence.   He is not having new bone pain. He has not seen blood in his stool since the biopsy. He has not recently had unwanted weight loss. This condition would be considered of mild to moderate severity with no modifying factors or associated signs or symptoms other than as noted above.   08/21/17: He was initially treated with seed implantation in 8/15 with a significant follow his PSA. A TURP specimen in 4/17 revealed Gleason 6 adenocarcinoma in 5% of the specimen with what was described as "treatment effect". His PSA has remained low.     CC/HPI: 12/27/17: He reports today that over the past 2 days he has felt like he is "beginning to shut down" he used to have a lot of difficulty with stress incontinence and wears a pad during the day but his pad has remained dry. He said he gets an urge to urinate but voids only a small amount. He is having nocturia 3-4 times. He has perform self catheterization on 2 occasions both times finally only a small amount in his bladder. He denies any dysuria or hematuria.  This would be considered of moderate severity with no modifying factors or other associated signs and symptoms.        ALLERGIES: Penicillins - Other Reaction, syncope    MEDICATIONS: Doxycycline Hyclate 100 mg capsule 1 capsule PO Q 12 H  Fish Oil 1000 MG Oral Capsule Oral  Lisinopril 10 mg tablet Oral  Milk Thistle 1 PO Daily  Stool Softener 100 mg  tablet Oral  Uribel 118 mg-10 mg-40.8 mg-36 mg-0.12 mg capsule Take 3-4 tablets daily as needed     GU PSH: Catheterize For Residual - 12/12/2016 Cystoscopy - 12/27/2017 Cystoscopy TUIP - 09/07/2015 Cystoscopy TURP - 2017 Locm 300-399Mg /Ml Iodine,1Ml - 05/12/2016 Open SP Tube Placement - 2015 Prostate Needle Biopsy - 2015 TRANSPERI NEEDLE PLACE, PROS - 2015      PSH Notes: Transurethral Resection Of Prostate (TURP), Transurethral Incision Of Prostate, Bladder Cystotomy With Drainage, Surgery Prostate Transperineal Placement Of Needles, Biopsy Of The Prostate Needle, No Surgical Problems   NON-GU PSH: Bmi>=30Or<22 Cal No Followup - 02/08/2017 Doc Meds Verified W/Pt Or Re - 02/08/2017 Other Pt/Ot Current Status - 08/28/2017, 06/26/2017, 02/08/2017 Other Pt/Ot Goal Status - 08/28/2017, 06/26/2017, 02/08/2017 Pain Doc Pos And Plan - 02/08/2017    GU PMH: Bulbar urethral stricture, He does have some stricture in the bulbar region but it should be wide enough that he can urinate through this. If, it turns out he has an infection and he is treated, but remains symptomatic then consideration of balloon dilatation of this area would be given otherwise I am going to proceed conservatively. - 12/27/2017 Mixed incontinence - 08/28/2017, - 07/12/2017, - 06/26/2017, - 05/24/2017, - 05/03/2017, - 04/12/2017, - 02/27/2017, - 02/08/2017 History of prostate cancer (Stable), His PSA remains exceedingly low at 0.066. I will continue to monitor this on a yearly basis since he has  had an excellent response. - 08/21/2017 Stress Incontinence (Improving), He continues to have stress incontinence. He has seen some slight improvement with physical therapy. He will continue with his PT. - 08/21/2017, (Stable), He has developed stress incontinence after undergoing a TURP for urinary retention. He has had previous radioactive seeds and we had discussed this was a risk. I had a long discussion with him today about the options. We discussed  nonsurgical management with pelvic floor physical therapy and he understands that this may not result in complete resolution of his leakage but may make it manageable to the point that he can avoid surgery. The other 2 options for surgical and we discussed today the procedure of artificial urinary sphincter placement and how this would work and we also discussed the placement of a male sling., - 01/23/2017 Dysuria - 06/26/2017 Pelvic/perineal pain - 06/26/2017 Acute Cystitis/UTI, He appears to have infected urine and clinically it sounds as if he has an infection. His culture last time was not particularly helpful as there were 3 organisms. If that occurs again they will need to be identified. In the meantime I'm going to begin empiric Cipro and contact him with the result of the culture and any need for a change in antibiotics. If he continues to have difficulty with recurrent infections I told him we may either try a long course of antibiotics to try to eradicate any form of deep-seated infection versus placing him on low-dose, nightly prophylaxis. - 12/01/2016, - 11/06/2016 Incontinence w/o Sensation (Worsening), He is having incontinence after surgery I think it's most likely going to be stress incontinence but I did give him samples of Myrbetriq 50 mg today to see if this would affect his incontinence in any way. If it does he will contact me for prescription otherwise I told him if he noted no improvement then I would recommend physical therapy. - 12/01/2016 Urinary Retention, drug induced (Improving), It appears he is now voiding spontaneously without further need for intermittent self-catheterization. - 07/25/2016 Urinary Retention (Stable), We discussed the options which would include a voiding trial today however I'm concerned that he would end up in the emergency room if he was unable to urinate and so I told him that he could be taught how to perform self-catheterization and then the catheter could be taken  out today. The other option would be to have him take his catheter out first thing in the morning and force fluids but if he is unable to urinate and he comes into the office I told him that he needed to consider learning how to perform self-catheterization. The only other option would be the placement of the suprapubic tube. He didn't like any of the options because he did not want to perform self-catheterization but I told him that there is no medication that will cause his bladder to squeeze down harder and we knew that he had a large cystometric capacity at the time of his urodynamics that could potentially result in difficulty voiding which is why I put a suprapubic tube in at the time of his TURP in the first place. - 06/08/2016, Acute urinary retention, - 2017 Gross hematuria (Acute), Culture urine. Will empirically begin SMZ-TMP DS 1 po BID X 7 days Will proceed with hematuria workup with CT and cysto w/Dr. Karsten Ro - 05/11/2016 ED due to arterial insufficiency, Erectile dysfunction due to arterial insufficiency - 2017 Prostate Cancer, Adenocarcinoma of prostate - 2017 BPH w/LUTS, Benign prostatic hypertrophy (BPH) with incomplete bladder emptying -  2017 Urinary Retention, Unspec, Incomplete bladder emptying - 2017 Urinary Frequency, Urinary frequency - 2016      PMH Notes: Adenocarcinoma the prostate: He was found to have an elevated PSA and 4/15 with no abnormality noted on DRE. There is no family history of prostate cancer.  PSAs:  12/12 - 2.9  4/15 - 6.2  TRUS/BX 02/17/14: Prostate volume - 23 cc with no abnormality noted on ultrasound.  Pathology: Adenocarcinoma Gleason 3+3 = 6 in both lobes 5/12 cores positive.  Treatment: I-125 seed implant 05/08/14.   Erectile dysfunction: This had been managed with Viagra 100 mg.   Urinary retention: He developed urinary retention and failed voiding trials. When a catheter was placed on 06/10/14 he was found to have 1250 cc in the bladder.  Urodynamics  07/28/14: He has a large cystometric capacity and although he does generate an adequate detrusor contraction voluntarily he has outlet obstruction. He also has some instability which is secondary. The problem is that he has had previous prostatic radiation and therefore outlet reduction surgery carries an increased risk of stress incontinence.  Treatment: SP tube placement 08/17/14.  TUIP 09/06/15 - Pathology - benign.  TURP 01/21/16 - his prostatic fossa was fully resected but only contained 6 g of prostate tissue  Pathology: Adenocarcinoma Gleason score 3+3 = 6 in 5% of the specimen.     NON-GU PMH: Pyuria/other UA findings, He has pyuria and that may be why he is feeling as if he is not emptying his bladder. I am going to culture the urine and impaired coli began and body aches today. If he notes resolution of his symptoms then he will not need any further therapy for the mild stricturing that I noted cystoscopically. - 12/27/2017, He did have some pyuria today and back to urea so I will culture his urine and contact him with the results., - 08/21/2017 (Stable), He did have some pyuria and hematuria. I will culture his urine but I think this is most likely due to his previously resected prostatic urethra and the effects of radiation with slow/nonhealing., - 01/23/2017 Muscle weakness (generalized) - 08/28/2017, - 07/12/2017, - 06/26/2017, - 05/24/2017, - 05/03/2017, - 04/12/2017, - 02/27/2017, - 02/08/2017 Other muscle spasm - 08/28/2017, - 07/12/2017, - 06/26/2017, - 05/24/2017, - 05/03/2017, - 04/12/2017, - 02/27/2017, - 02/08/2017 Other specified postprocedural states, Resolution of gross hematuria. - 05/16/2016 Encounter for attention to cystostomy, Encounter for care or replacement of suprapubic tube - 2017 Unspecified cystostomy status, Cystostomy in place - 2017 Other artificial openings of urinary tract, Urinary tract artificial opening in place - 2017 Encounter for general adult medical examination without  abnormal findings, Encounter for preventive health examination - 2015 Personal history of other diseases of the circulatory system, History of hypertension - 2015    FAMILY HISTORY: Colon Cancer - Runs In Family Diabetes - Runs In Family Lung Cancer - Runs In Family   SOCIAL HISTORY: Marital Status: Married Preferred Language: English; Ethnicity: Not Hispanic Or Latino; Race: White Current Smoking Status: Patient does not smoke anymore.  Social Drinker.     REVIEW OF SYSTEMS:    GU Review Male:   Patient denies frequent urination, hard to postpone urination, burning/ pain with urination, get up at night to urinate, leakage of urine, stream starts and stops, trouble starting your stream, have to strain to urinate , erection problems, and penile pain.  Gastrointestinal (Upper):   Patient denies nausea, vomiting, and indigestion/ heartburn.  Gastrointestinal (Lower):   Patient denies diarrhea  and constipation.  Constitutional:   Patient denies night sweats, fatigue, weight loss, and fever.  Skin:   Patient denies skin rash/ lesion and itching.  Eyes:   Patient denies blurred vision and double vision.  Ears/ Nose/ Throat:   Patient denies sore throat and sinus problems.  Hematologic/Lymphatic:   Patient denies swollen glands and easy bruising.  Cardiovascular:   Patient denies leg swelling and chest pains.  Respiratory:   Patient denies cough and shortness of breath.  Endocrine:   Patient denies excessive thirst.  Musculoskeletal:   Patient denies back pain and joint pain.  Neurological:   Patient denies headaches and dizziness.  Psychologic:   Patient denies depression and anxiety.   VITAL SIGNS:    Weight 179 lb / 81.19 kg  Height 66 in / 167.64 cm  BP 132/63 mmHg  Pulse 56 /min  Temperature 96.3 F / 35.7 C  BMI 28.9 kg/m   GU PHYSICAL EXAMINATION:    Anus and Perineum: No hemorrhoids. No anal stenosis. No rectal fissure, no anal fissure. No edema, no dimple, no perineal  tenderness, no anal tenderness.  Prostate: 40 gram or 2+ size. Left lobe normal consistency, right lobe normal consistency. Symmetrical lobes. No prostate nodule. Left lobe no tenderness, right lobe no tenderness.    MULTI-SYSTEM PHYSICAL EXAMINATION:    Constitutional: Well-nourished. No physical deformities. Normally developed. Good grooming.  Respiratory: No labored breathing, no use of accessory muscles.   Cardiovascular: Normal temperature, no swelling, no varicosities.   Skin: No paleness, no jaundice, no cyanosis. No lesion, no ulcer, no rash.  Neurologic / Psychiatric: Oriented to time, oriented to place, oriented to person. No depression, no anxiety, no agitation.  Gastrointestinal: No mass, no tenderness, no rigidity, non obese abdomen.  Musculoskeletal: Spine, ribs, pelvis no bilateral tenderness. Normal gait and station of head and neck.    PAST DATA REVIEWED:  Source Of History:  Patient  Records Review:   Previous Patient Records, POC Tool   07/24/17 01/23/17 07/25/16 01/29/16 02/03/15  PSA  Total PSA 0.066 ng/mL 0.11 ng/dl 0.12 ng/dl 0.95  0.62     PROCEDURES:         PVR Ultrasound - 51798  Scanned Volume: 138 cc         Urinalysis w/Scope Dipstick Dipstick Cont'd Micro  Color: Yellow Bilirubin: Neg WBC/hpf: 10 - 20/hpf  Appearance: Clear Ketones: Neg RBC/hpf: NS (Not Seen)  Specific Gravity: 1.015 Blood: Neg Bacteria: Rare (0-9/hpf)  pH: <=5.0 Protein: Neg Cystals: NS (Not Seen)  Glucose: Neg Urobilinogen: 0.2 Casts: NS (Not Seen)    Nitrites: Neg Trichomonas: Not Present    Leukocyte Esterase: Trace Mucous: Not Present      Epithelial Cells: NS (Not Seen)      Yeast: NS (Not Seen)      Sperm: Not Present    Notes: CLUMPS NOTED    ASSESSMENT/PLAN:      ICD-10 Details  Incomplete bladder emptying - R39.14 He was found to have an elevated PVR of 138 cc today so we discussed the fact that this can increase his risk of recurrent infections due to the  incomplete emptying of his bladder.   Bulbar urethral stricture - N35.011 Stable - He did have a bulbar urethral stricture but I thought he would be able to urinate and empty his bladder adequately due to the fairly mild nature of the stricture but it appears he has gotten to the point where he is no longer emptying his bladder  and this is resulted in infection so we discussed dilation. He is going to be scheduled for this as an outpatient.   Pyuria/other UA findings - When he was seen on 03/05/18 he did have pyuria and therefore a urine culture was performed.  The culture was found to be negative.  We discussed the options for management of urethral stricture including visual internal urethrotomy, balloon dilatation and formal stricture repair. He has elected to proceed with dilation initially with the understanding that if he recurs, which is going to be his greatest risk, we may need to consider self dilation as an option.

## 2018-04-01 ENCOUNTER — Encounter (HOSPITAL_BASED_OUTPATIENT_CLINIC_OR_DEPARTMENT_OTHER): Payer: Self-pay | Admitting: Certified Registered Nurse Anesthetist

## 2018-04-01 ENCOUNTER — Ambulatory Visit (HOSPITAL_BASED_OUTPATIENT_CLINIC_OR_DEPARTMENT_OTHER): Payer: Medicare HMO | Admitting: Certified Registered Nurse Anesthetist

## 2018-04-01 ENCOUNTER — Encounter (HOSPITAL_BASED_OUTPATIENT_CLINIC_OR_DEPARTMENT_OTHER)
Admission: RE | Disposition: A | Discharge status: Home / Self Care | Payer: Self-pay | Source: Ambulatory Visit | Attending: Urology

## 2018-04-01 ENCOUNTER — Ambulatory Visit (HOSPITAL_BASED_OUTPATIENT_CLINIC_OR_DEPARTMENT_OTHER)
Admission: RE | Admit: 2018-04-01 | Discharge: 2018-04-01 | Disposition: A | Discharge status: Home / Self Care | Payer: Medicare HMO | Source: Ambulatory Visit | Attending: Urology | Admitting: Urology

## 2018-04-01 DIAGNOSIS — Z880 Allergy status to penicillin: Secondary | ICD-10-CM | POA: Diagnosis not present

## 2018-04-01 DIAGNOSIS — N35912 Unspecified bulbous urethral stricture, male: Secondary | ICD-10-CM

## 2018-04-01 DIAGNOSIS — Z8546 Personal history of malignant neoplasm of prostate: Secondary | ICD-10-CM | POA: Diagnosis not present

## 2018-04-01 DIAGNOSIS — N35011 Post-traumatic bulbous urethral stricture: Secondary | ICD-10-CM | POA: Diagnosis not present

## 2018-04-01 DIAGNOSIS — Z79899 Other long term (current) drug therapy: Secondary | ICD-10-CM | POA: Insufficient documentation

## 2018-04-01 DIAGNOSIS — Z923 Personal history of irradiation: Secondary | ICD-10-CM | POA: Insufficient documentation

## 2018-04-01 DIAGNOSIS — N210 Calculus in bladder: Secondary | ICD-10-CM | POA: Diagnosis not present

## 2018-04-01 DIAGNOSIS — Z801 Family history of malignant neoplasm of trachea, bronchus and lung: Secondary | ICD-10-CM | POA: Insufficient documentation

## 2018-04-01 DIAGNOSIS — I10 Essential (primary) hypertension: Secondary | ICD-10-CM | POA: Diagnosis not present

## 2018-04-01 DIAGNOSIS — R32 Unspecified urinary incontinence: Secondary | ICD-10-CM | POA: Insufficient documentation

## 2018-04-01 DIAGNOSIS — Z87891 Personal history of nicotine dependence: Secondary | ICD-10-CM | POA: Diagnosis not present

## 2018-04-01 DIAGNOSIS — Z833 Family history of diabetes mellitus: Secondary | ICD-10-CM | POA: Insufficient documentation

## 2018-04-01 HISTORY — DX: Personal history of other specified conditions: Z87.898

## 2018-04-01 HISTORY — DX: Benign prostatic hyperplasia with lower urinary tract symptoms: N13.8

## 2018-04-01 HISTORY — PX: CYSTOSCOPY WITH RETROGRADE URETHROGRAM: SHX6309

## 2018-04-01 HISTORY — DX: Retention of urine, unspecified: R33.9

## 2018-04-01 HISTORY — DX: Benign prostatic hyperplasia with lower urinary tract symptoms: N40.1

## 2018-04-01 LAB — POCT I-STAT 4, (NA,K, GLUC, HGB,HCT)
Glucose, Bld: 131 mg/dL — ABNORMAL HIGH (ref 70–99)
HCT: 33 % — ABNORMAL LOW (ref 39.0–52.0)
Hemoglobin: 11.2 g/dL — ABNORMAL LOW (ref 13.0–17.0)
Potassium: 4.9 mmol/L (ref 3.5–5.1)
Sodium: 142 mmol/L (ref 135–145)

## 2018-04-01 SURGERY — CYSTOSCOPY WITH RETROGRADE URETHROGRAM
Anesthesia: General | Site: Bladder

## 2018-04-01 MED ORDER — ONDANSETRON HCL 4 MG/2ML IJ SOLN
INTRAMUSCULAR | Status: DC | PRN
  Administered 2018-04-01: 4 mg via INTRAVENOUS

## 2018-04-01 MED ORDER — CIPROFLOXACIN IN D5W 400 MG/200ML IV SOLN
INTRAVENOUS | Status: AC
Start: 2018-04-01 — End: ?
  Filled 2018-04-01: qty 200

## 2018-04-01 MED ORDER — PROPOFOL 10 MG/ML IV BOLUS
INTRAVENOUS | Status: DC | PRN
  Administered 2018-04-01: 150 mg via INTRAVENOUS

## 2018-04-01 MED ORDER — FENTANYL CITRATE (PF) 100 MCG/2ML IJ SOLN
INTRAMUSCULAR | Status: AC
  Filled 2018-04-01: qty 2

## 2018-04-01 MED ORDER — OXYCODONE HCL 5 MG/5ML PO SOLN
5.0000 mg | Freq: Once | ORAL | Status: DC | PRN
  Filled 2018-04-01: qty 5

## 2018-04-01 MED ORDER — FENTANYL CITRATE (PF) 100 MCG/2ML IJ SOLN
25.0000 ug | INTRAMUSCULAR | Status: DC | PRN
  Filled 2018-04-01: qty 1

## 2018-04-01 MED ORDER — OXYCODONE HCL 5 MG PO TABS
5.0000 mg | ORAL_TABLET | Freq: Once | ORAL | Status: DC | PRN
  Filled 2018-04-01: qty 1

## 2018-04-01 MED ORDER — DEXAMETHASONE SODIUM PHOSPHATE 10 MG/ML IJ SOLN
INTRAMUSCULAR | Status: DC | PRN
  Administered 2018-04-01: 10 mg via INTRAVENOUS

## 2018-04-01 MED ORDER — HYDROCODONE-ACETAMINOPHEN 10-325 MG PO TABS: 1.0000 | ORAL_TABLET | ORAL | 0 refills | Status: DC | PRN

## 2018-04-01 MED ORDER — PROMETHAZINE HCL 25 MG/ML IJ SOLN
6.2500 mg | INTRAMUSCULAR | Status: DC | PRN
  Filled 2018-04-01: qty 1

## 2018-04-01 MED ORDER — ONDANSETRON HCL 4 MG/2ML IJ SOLN
INTRAMUSCULAR | Status: AC
Start: 2018-04-01 — End: ?
  Filled 2018-04-01: qty 2

## 2018-04-01 MED ORDER — DEXAMETHASONE SODIUM PHOSPHATE 10 MG/ML IJ SOLN
INTRAMUSCULAR | Status: AC
  Filled 2018-04-01: qty 1

## 2018-04-01 MED ORDER — LACTATED RINGERS IV SOLN
INTRAVENOUS | Status: DC
  Administered 2018-04-01: 10:00:00 via INTRAVENOUS
  Filled 2018-04-01: qty 1000

## 2018-04-01 MED ORDER — IOHEXOL 300 MG/ML  SOLN
INTRAMUSCULAR | Status: DC | PRN
  Administered 2018-04-01: 10 mL via URETHRAL

## 2018-04-01 MED ORDER — LACTATED RINGERS IV SOLN
INTRAVENOUS | Status: DC
  Filled 2018-04-01: qty 1000

## 2018-04-01 MED ORDER — LIDOCAINE 2% (20 MG/ML) 5 ML SYRINGE
INTRAMUSCULAR | Status: DC | PRN
  Administered 2018-04-01: 80 mg via INTRAVENOUS

## 2018-04-01 MED ORDER — FENTANYL CITRATE (PF) 100 MCG/2ML IJ SOLN
INTRAMUSCULAR | Status: DC | PRN
  Administered 2018-04-01: 50 ug via INTRAVENOUS
  Administered 2018-04-01 (×2): 25 ug via INTRAVENOUS

## 2018-04-01 MED ORDER — PHENAZOPYRIDINE HCL 200 MG PO TABS: 200.0000 mg | ORAL_TABLET | Freq: Three times a day (TID) | ORAL | 0 refills | Status: DC | PRN

## 2018-04-01 MED ORDER — STERILE WATER FOR IRRIGATION IR SOLN
Status: DC | PRN
  Administered 2018-04-01: 3000 mL

## 2018-04-01 MED ORDER — CIPROFLOXACIN IN D5W 400 MG/200ML IV SOLN
400.0000 mg | Freq: Once | INTRAVENOUS | Status: AC
  Administered 2018-04-01: 400 mg via INTRAVENOUS
  Filled 2018-04-01: qty 200

## 2018-04-01 MED ORDER — LIDOCAINE 2% (20 MG/ML) 5 ML SYRINGE
INTRAMUSCULAR | Status: AC
Start: 2018-04-01 — End: ?
  Filled 2018-04-01: qty 5

## 2018-04-01 MED ORDER — MEPERIDINE HCL 25 MG/ML IJ SOLN
6.2500 mg | INTRAMUSCULAR | Status: DC | PRN
  Filled 2018-04-01: qty 1

## 2018-04-01 SURGICAL SUPPLY — 32 items
BAG DRAIN URO-CYSTO SKYTR STRL (DRAIN) ×2 IMPLANT
BAG URINE DRAINAGE (UROLOGICAL SUPPLIES) IMPLANT
BAG URINE LEG 19OZ MD ST LTX (BAG) ×2 IMPLANT
BALLN NEPHROSTOMY (BALLOONS) ×2
BALLOON NEPHROSTOMY (BALLOONS) ×1 IMPLANT
CATH FOLEY 2WAY SLVR  5CC 14FR (CATHETERS)
CATH FOLEY 2WAY SLVR  5CC 16FR (CATHETERS) ×1
CATH FOLEY 2WAY SLVR 5CC 14FR (CATHETERS) IMPLANT
CATH FOLEY 2WAY SLVR 5CC 16FR (CATHETERS) ×1 IMPLANT
CLOTH BEACON ORANGE TIMEOUT ST (SAFETY) ×4 IMPLANT
ELECT REM PT RETURN 9FT ADLT (ELECTROSURGICAL)
ELECTRODE REM PT RTRN 9FT ADLT (ELECTROSURGICAL) IMPLANT
GLOVE BIO SURGEON STRL SZ 6.5 (GLOVE) ×2 IMPLANT
GLOVE BIO SURGEON STRL SZ8 (GLOVE) ×2 IMPLANT
GLOVE BIOGEL PI IND STRL 6.5 (GLOVE) ×1 IMPLANT
GLOVE BIOGEL PI INDICATOR 6.5 (GLOVE) ×1
GOWN STRL REUS W/ TWL XL LVL3 (GOWN DISPOSABLE) ×1 IMPLANT
GOWN STRL REUS W/TWL LRG LVL3 (GOWN DISPOSABLE) ×2 IMPLANT
GOWN STRL REUS W/TWL XL LVL3 (GOWN DISPOSABLE) ×1
GUIDEWIRE STR DUAL SENSOR (WIRE) IMPLANT
IV NS IRRIG 3000ML ARTHROMATIC (IV SOLUTION) IMPLANT
KIT TURNOVER CYSTO (KITS) ×2 IMPLANT
MANIFOLD NEPTUNE II (INSTRUMENTS) IMPLANT
NDL SAFETY ECLIPSE 18X1.5 (NEEDLE) IMPLANT
NEEDLE HYPO 18GX1.5 SHARP (NEEDLE)
NEEDLE HYPO 22GX1.5 SAFETY (NEEDLE) IMPLANT
NS IRRIG 500ML POUR BTL (IV SOLUTION) IMPLANT
PACK CYSTO (CUSTOM PROCEDURE TRAY) ×2 IMPLANT
SYR 20CC LL (SYRINGE) IMPLANT
SYR 30ML LL (SYRINGE) IMPLANT
TUBE CONNECTING 12X1/4 (SUCTIONS) IMPLANT
WATER STERILE IRR 3000ML UROMA (IV SOLUTION) ×2 IMPLANT

## 2018-04-01 NOTE — Discharge Instructions (Signed)
°  Post Anesthesia Home Care Instructions ° °Activity: °Get plenty of rest for the remainder of the day. A responsible individual must stay with you for 24 hours following the procedure.  °For the next 24 hours, DO NOT: °-Drive a car °-Operate machinery °-Drink alcoholic beverages °-Take any medication unless instructed by your physician °-Make any legal decisions or sign important papers. ° °Meals: °Start with liquid foods such as gelatin or soup. Progress to regular foods as tolerated. Avoid greasy, spicy, heavy foods. If nausea and/or vomiting occur, drink only clear liquids until the nausea and/or vomiting subsides. Call your physician if vomiting continues. ° °Special Instructions/Symptoms: °Your throat may feel dry or sore from the anesthesia or the breathing tube placed in your throat during surgery. If this causes discomfort, gargle with warm salt water. The discomfort should disappear within 24 hours. ° °If you had a scopolamine patch placed behind your ear for the management of post- operative nausea and/or vomiting: ° °1. The medication in the patch is effective for 72 hours, after which it should be removed.  Wrap patch in a tissue and discard in the trash. Wash hands thoroughly with soap and water. °2. You may remove the patch earlier than 72 hours if you experience unpleasant side effects which may include dry mouth, dizziness or visual disturbances. °3. Avoid touching the patch. Wash your hands with soap and water after contact with the patch. °   °Cystoscopy patient instructions ° °Following a cystoscopy, a catheter (a flexible rubber tube) is sometimes left in place to empty the bladder. This may cause some discomfort or a feeling that you need to urinate. Your doctor determines the period of time that the catheter will be left in place. °You may have bloody urine for two to three days (Call your doctor if the amount of bleeding increases or does not subside). ° °You may pass blood clots in your  urine, especially if you had a biopsy. It is not unusual to pass small blood clots and have some bloody urine a couple of weeks after your cystoscopy. Again, call your doctor if the bleeding does not subside. °You may have: °Dysuria (painful urination) °Frequency (urinating often) °Urgency (strong desire to urinate) ° °These symptoms are common especially if medicine is instilled into the bladder or a ureteral stent is placed. Avoiding alcohol and caffeine, such as coffee, tea, and chocolate, may help relieve these symptoms. Drink plenty of water, unless otherwise instructed. Your doctor may also prescribe an antibiotic or other medicine to reduce these symptoms. ° °Cystoscopy results are available soon after the procedure; biopsy results usually take two to four days. Your doctor will discuss the results of your exam with you. Before you go home, you will be given specific instructions for follow-up care. °Special Instructions: ° °1  If you are going home with a catheter in place do not take a tub bath until removed by your doctor.  °2  You may resume your normal activities.  °3  Do not drive or operate machinery if you are taking narcotic pain medicine.  °4  Be sure to keep all follow-up appointments with your doctor.  ° °5 Call Your Doctor If: The catheter is not draining ° You have severe pain ° You are unable to urinate ° You have a fever over 101 ° You have severe bleeding °        ° ° °

## 2018-04-01 NOTE — Anesthesia Procedure Notes (Signed)
Procedure Name: LMA Insertion Date/Time: 04/01/2018 11:17 AM Performed by: Genelle Bal, CRNA Pre-anesthesia Checklist: Patient identified, Emergency Drugs available, Suction available and Patient being monitored Patient Re-evaluated:Patient Re-evaluated prior to induction Oxygen Delivery Method: Circle system utilized Preoxygenation: Pre-oxygenation with 100% oxygen Induction Type: IV induction Ventilation: Mask ventilation without difficulty LMA: LMA inserted LMA Size: 4.0 Number of attempts: 1 Airway Equipment and Method: Bite block Placement Confirmation: positive ETCO2 Tube secured with: Tape Dental Injury: Teeth and Oropharynx as per pre-operative assessment

## 2018-04-01 NOTE — Anesthesia Preprocedure Evaluation (Addendum)
Anesthesia Evaluation  Patient identified by MRN, date of birth, ID band Patient awake    Reviewed: Allergy & Precautions, NPO status , Patient's Chart, lab work & pertinent test results  Airway Mallampati: I  TM Distance: >3 FB Neck ROM: Full    Dental  (+) Teeth Intact, Dental Advisory Given   Pulmonary former smoker,    breath sounds clear to auscultation       Cardiovascular hypertension, Pt. on home beta blockers  Rhythm:Regular Rate:Normal     Neuro/Psych negative neurological ROS     GI/Hepatic negative GI ROS, Neg liver ROS,   Endo/Other  negative endocrine ROS  Renal/GU negative Renal ROS     Musculoskeletal negative musculoskeletal ROS (+)   Abdominal Normal abdominal exam  (+)   Peds  Hematology negative hematology ROS (+)   Anesthesia Other Findings   Reproductive/Obstetrics                            Anesthesia Physical Anesthesia Plan  ASA: II  Anesthesia Plan: General   Post-op Pain Management:    Induction: Intravenous  PONV Risk Score and Plan: 3 and Ondansetron, Dexamethasone and Treatment may vary due to age or medical condition  Airway Management Planned: LMA  Additional Equipment: None  Intra-op Plan:   Post-operative Plan: Extubation in OR  Informed Consent: I have reviewed the patients History and Physical, chart, labs and discussed the procedure including the risks, benefits and alternatives for the proposed anesthesia with the patient or authorized representative who has indicated his/her understanding and acceptance.   Dental advisory given  Plan Discussed with: CRNA  Anesthesia Plan Comments:        Anesthesia Quick Evaluation

## 2018-04-01 NOTE — Op Note (Signed)
PATIENT:  Dustin Ramirez  PRE-OPERATIVE DIAGNOSIS: 1.  Bulbar urethral stricture  POST-OPERATIVE DIAGNOSIS: 1.  Bulbar urethral stricture 2.  Bladder calculus  PROCEDURE: 1.  Cystoscopy with balloon dilatation of urethral stricture. 2.  Removal of bladder calculus  SURGEON:  [name]  INDICATION: Dustin Ramirez is a 77 year old male with a history of prostate cancer that was treated with brachii therapy in 8/15.  He then developed urinary retention and required transurethral resection of his prostate.  He was recently found to have a mild bulbar urethral stricture and was not completely emptying his bladder placing him at an increased risk of infections.  We discussed the treatment options for his urethral stricture and he is elected to proceed initially with dilation area cystoscopy with urethral dilatation  ANESTHESIA:  General  EBL:  Minimal  DRAINS: 16 French Foley catheter   LOCAL MEDICATIONS USED:  None  SPECIMEN:    Description of procedure: After informed consent the patient was taken to the operating room and placed on the table in a supine position. General anesthesia was then administered. Once fully anesthetized the patient was moved to the dorsal lithotomy position and the genitalia were sterilely prepped and draped in standard fashion. An official timeout was then performed.  The 21 French cystoscope was passed under direct vision down the urethra which was noted to be normal to the level of the bulbar urethra where the stricture was identified and photographed.  I then removed the cystoscope and performed a retrograde urethrogram.  A 16 French Foley catheter was placed in the urethra and approximately 1 cc was used to fill the balloon holding the catheter in the area of the fossa navicularis.  I then injected full-strength Omnipaque contrast through the catheter and down through the urethra.  The stricture was identified and appeared short.  I then removed the  catheter.  I reinserted the cystoscope and passed a 0.038 inch sensor guidewire through the cystoscope and through the stricture into the bladder confirming its location fluoroscopically.  I then passed the UroMax dilating balloon over the guidewire and across the stricture.  I then inflated the dilating balloon to 14 atm I then deflated the balloon and repeated cystoscopy noting the stricture had been widely dilated with no significant bleeding.  I calibrated this with Leander Rams sounds up to 61 [place] which passed easily through the area.  Repeat cystoscopy again was performed and I noted the stricture was widely patent with no significant bleeding or obvious urethral trauma.  Advancing the scope into the area of the prostatic urethra noted this was well resected.  The bladder was then entered with a full, systematic inspection undertaken with no tumors or inflammatory lesions identified.  I noted the right and left ureteral orifice appeared normal.  There was a small stone approximately 1 cm in size located on the floor of the bladder.  I was able to flush this out through the cystoscope.  I then drained the bladder and inserted a 16 French Foley catheter the patient was awakened and taken to the recovery room in stable and satisfactory condition.  He tolerated the procedure well with no intraoperative complications.  PLAN OF CARE: Discharge to home after PACU  PATIENT DISPOSITION:  PACU - hemodynamically stable.

## 2018-04-01 NOTE — Transfer of Care (Signed)
Immediate Anesthesia Transfer of Care Note  Patient: Dustin Ramirez  Procedure(s) Performed: CYSTOSCOPY WITH RETROGRADE URETHROGRAM/ DILATION (N/A Bladder)  Patient Location: PACU  Anesthesia Type:General  Level of Consciousness: awake, alert  and oriented  Airway & Oxygen Therapy: Patient Spontanous Breathing and Patient connected to nasal cannula oxygen  Post-op Assessment: Report given to RN and Post -op Vital signs reviewed and stable  Post vital signs: Reviewed and stable  Last Vitals:  Vitals Value Taken Time  BP    Temp    Pulse 62 04/01/2018 11:53 AM  Resp    SpO2 100 % 04/01/2018 11:53 AM  Vitals shown include unvalidated device data.  Last Pain:  Vitals:   04/01/18 0951  TempSrc:   PainSc: 0-No pain      Patients Stated Pain Goal: 5 (02/72/53 6644)  Complications: No apparent anesthesia complications

## 2018-04-02 ENCOUNTER — Encounter (HOSPITAL_BASED_OUTPATIENT_CLINIC_OR_DEPARTMENT_OTHER): Payer: Self-pay | Admitting: Urology

## 2018-04-02 NOTE — Anesthesia Postprocedure Evaluation (Signed)
Anesthesia Post Note  Patient: Dustin Ramirez  Procedure(s) Performed: CYSTOSCOPY WITH RETROGRADE URETHROGRAM/ DILATION (N/A Bladder)     Patient location during evaluation: PACU Anesthesia Type: General Level of consciousness: awake and alert Pain management: pain level controlled Vital Signs Assessment: post-procedure vital signs reviewed and stable Respiratory status: spontaneous breathing, nonlabored ventilation, respiratory function stable and patient connected to nasal cannula oxygen Cardiovascular status: blood pressure returned to baseline and stable Postop Assessment: no apparent nausea or vomiting Anesthetic complications: no    Last Vitals:  Vitals:   04/01/18 1152 04/01/18 1317  BP: 130/75 132/80  Pulse: 62 73  Resp: 20 20  Temp: (!) 36.3 C 36.7 C  SpO2: 100% 100%    Last Pain:  Vitals:   04/01/18 1317  TempSrc:   PainSc: 0-No pain                 Effie Berkshire

## 2018-04-16 DIAGNOSIS — Z8546 Personal history of malignant neoplasm of prostate: Secondary | ICD-10-CM | POA: Diagnosis not present

## 2018-04-16 DIAGNOSIS — N35011 Post-traumatic bulbous urethral stricture: Secondary | ICD-10-CM | POA: Diagnosis not present

## 2018-05-31 DIAGNOSIS — D123 Benign neoplasm of transverse colon: Secondary | ICD-10-CM | POA: Diagnosis not present

## 2018-05-31 DIAGNOSIS — K635 Polyp of colon: Secondary | ICD-10-CM | POA: Diagnosis not present

## 2018-05-31 DIAGNOSIS — Z800 Family history of malignant neoplasm of digestive organs: Secondary | ICD-10-CM | POA: Diagnosis not present

## 2018-05-31 DIAGNOSIS — Z1211 Encounter for screening for malignant neoplasm of colon: Secondary | ICD-10-CM | POA: Diagnosis not present

## 2018-05-31 DIAGNOSIS — Z86010 Personal history of colonic polyps: Secondary | ICD-10-CM | POA: Diagnosis not present

## 2018-06-05 DIAGNOSIS — K5732 Diverticulitis of large intestine without perforation or abscess without bleeding: Secondary | ICD-10-CM | POA: Diagnosis not present

## 2018-08-28 DIAGNOSIS — Z23 Encounter for immunization: Secondary | ICD-10-CM | POA: Diagnosis not present

## 2018-10-04 DIAGNOSIS — Z8546 Personal history of malignant neoplasm of prostate: Secondary | ICD-10-CM | POA: Diagnosis not present

## 2018-10-10 DIAGNOSIS — N3942 Incontinence without sensory awareness: Secondary | ICD-10-CM | POA: Diagnosis not present

## 2018-10-10 DIAGNOSIS — Z8546 Personal history of malignant neoplasm of prostate: Secondary | ICD-10-CM | POA: Diagnosis not present

## 2018-10-10 DIAGNOSIS — R152 Fecal urgency: Secondary | ICD-10-CM | POA: Diagnosis not present

## 2018-10-10 DIAGNOSIS — N35011 Post-traumatic bulbous urethral stricture: Secondary | ICD-10-CM | POA: Diagnosis not present

## 2018-10-10 DIAGNOSIS — R8271 Bacteriuria: Secondary | ICD-10-CM | POA: Diagnosis not present

## 2018-12-21 ENCOUNTER — Telehealth: Payer: Medicare HMO | Admitting: Family

## 2018-12-21 DIAGNOSIS — R0602 Shortness of breath: Secondary | ICD-10-CM

## 2018-12-21 DIAGNOSIS — R05 Cough: Secondary | ICD-10-CM | POA: Diagnosis not present

## 2018-12-21 DIAGNOSIS — R509 Fever, unspecified: Secondary | ICD-10-CM | POA: Diagnosis not present

## 2018-12-21 MED ORDER — BENZONATATE 200 MG PO CAPS: 200.0000 mg | ORAL_CAPSULE | Freq: Three times a day (TID) | ORAL | 0 refills | Status: DC | PRN

## 2018-12-21 MED ORDER — ALBUTEROL SULFATE HFA 108 (90 BASE) MCG/ACT IN AERS: 2.0000 | INHALATION_SPRAY | RESPIRATORY_TRACT | 1 refills | Status: DC | PRN

## 2018-12-21 NOTE — Progress Notes (Signed)
Greater than 5 minutes, yet less than 10 minutes of time have been spent researching, coordinating, and implementing care for this patient today.  Thank you for the details you included in the comment boxes. Those details are very helpful in determining the best course of treatment for you and help [place] to provide the best care.  Unfortunately, there is no more testing; [facility] is out of it and to my knowledge, all of [facility] is out. You are still considered low-risk and would be turned away from a testing center if you showed up (if they weren't all closed down now).  E-Visit for Corona Virus Screening  Based on your current symptoms, it seems unlikely that your symptoms are related to the Coronavirus.   Coronavirus disease 2019 (COVID-19) is a respiratory illness that can spread from person to person. The virus that causes COVID-19 is a new virus that was first identified in the country of [place] but is now found in multiple other countries and has spread to the [place].  Symptoms associated with the virus are mild to severe fever, cough, and shortness of breath. There is currently no vaccine to protect against COVID-19, and there is no specific antiviral treatment for the virus.   To be considered HIGH RISK for Coronavirus (COVID-19), you have to meet the following criteria:  . Traveled to [place], [place], [place], [place] or [place]; or in the [place] to [place], [place], [place], or [place]; and have fever, cough, and shortness of breath within the last 2 weeks of travel OR  . Been in close contact with a person diagnosed with COVID-19 within the last 2 weeks and have fever, cough, and shortness of breath  . IF YOU DO NOT MEET THESE CRITERIA, YOU ARE CONSIDERED LOW RISK FOR COVID-19.   It is vitally important that if you feel that you have an infection such as this virus or any other virus that you stay home and away from places where you may spread it to others.  You should  self-quarantine for 14 days if you have symptoms that could potentially be coronavirus and avoid contact with people age 77 and older.   You can use medication such as A prescription cough medication called Tessalon Perles 100 mg. You may take 1-2 capsules every 8 hours as needed for cough and A prescription inhaler called Albuterol MDI 90 mcg /actuation 2 puffs every 4 hours as needed for shortness of breath, wheezing, cough  You may also take acetaminophen (Tylenol) as needed for fever.   Reduce your risk of any infection by using the same precautions used for avoiding the common cold or flu:  [name] Wash your hands often with soap and warm water for at least 20 seconds.  If soap and water are not readily available, use an alcohol-based hand sanitizer with at least 60% alcohol.  . If coughing or sneezing, cover your mouth and nose by coughing or sneezing into the elbow areas of your shirt or coat, into a tissue or into your sleeve (not your hands). . Avoid shaking hands with others and consider head nods or verbal greetings only. . Avoid touching your eyes, nose, or mouth with unwashed hands.  . Avoid close contact with people who are sick. . Avoid places or events with large numbers of people in one location, like concerts or sporting events. . Carefully consider travel plans you have or are making. . If you are planning any travel outside or inside  the [place], visit the CDC's Travelers' Health webpage for the latest health notices. . If you have some symptoms but not all symptoms, continue to monitor at home and seek medical attention if your symptoms worsen. . If you are having a medical emergency, call 911.  HOME CARE . Only take medications as instructed by your medical team. . Drink plenty of fluids and get plenty of rest. . A steam or ultrasonic humidifier can help if you have congestion.   GET HELP RIGHT AWAY IF: . You develop worsening fever. . You become short of breath . You cough up  blood. . Your symptoms become more severe MAKE SURE YOU   Understand these instructions.  Will watch your condition.  Will get help right away if you are not doing well or get worse.  Your e-visit answers were reviewed by a board certified advanced clinical practitioner to complete your personal care plan.  Depending on the condition, your plan could have included both over the counter or prescription medications.  If there is a problem please reply once you have received a response from your provider. Your safety is important to [place].  If you have drug allergies check your prescription carefully.    You can use MyChart to ask questions about today's visit, request a non-urgent call back, or ask for a work or school excuse for 24 hours related to this e-Visit. If it has been greater than 24 hours you will need to follow up with your provider, or enter a new e-Visit to address those concerns. You will get an e-mail in the next two days asking about your experience.  I hope that your e-visit has been valuable and will speed your recovery. Thank you for using e-visits.

## 2018-12-23 DIAGNOSIS — I10 Essential (primary) hypertension: Secondary | ICD-10-CM | POA: Diagnosis not present

## 2019-01-07 DIAGNOSIS — R159 Full incontinence of feces: Secondary | ICD-10-CM | POA: Diagnosis not present

## 2019-01-07 DIAGNOSIS — R198 Other specified symptoms and signs involving the digestive system and abdomen: Secondary | ICD-10-CM | POA: Diagnosis not present

## 2019-04-09 DIAGNOSIS — H26493 Other secondary cataract, bilateral: Secondary | ICD-10-CM | POA: Diagnosis not present

## 2019-04-22 DIAGNOSIS — H26491 Other secondary cataract, right eye: Secondary | ICD-10-CM | POA: Diagnosis not present

## 2019-04-29 DIAGNOSIS — Z79899 Other long term (current) drug therapy: Secondary | ICD-10-CM | POA: Diagnosis not present

## 2019-04-29 DIAGNOSIS — I10 Essential (primary) hypertension: Secondary | ICD-10-CM | POA: Diagnosis not present

## 2019-04-29 DIAGNOSIS — Z6825 Body mass index (BMI) 25.0-25.9, adult: Secondary | ICD-10-CM | POA: Diagnosis not present

## 2019-04-29 DIAGNOSIS — Z0000 Encounter for general adult medical examination without abnormal findings: Secondary | ICD-10-CM | POA: Diagnosis not present

## 2019-04-29 DIAGNOSIS — Z1331 Encounter for screening for depression: Secondary | ICD-10-CM | POA: Diagnosis not present

## 2019-05-20 DIAGNOSIS — H26492 Other secondary cataract, left eye: Secondary | ICD-10-CM | POA: Diagnosis not present

## 2019-07-14 DIAGNOSIS — Z23 Encounter for immunization: Secondary | ICD-10-CM | POA: Diagnosis not present

## 2020-03-30 DIAGNOSIS — G4700 Insomnia, unspecified: Secondary | ICD-10-CM | POA: Diagnosis not present

## 2020-03-30 DIAGNOSIS — I10 Essential (primary) hypertension: Secondary | ICD-10-CM | POA: Diagnosis not present

## 2020-03-30 DIAGNOSIS — Z6826 Body mass index (BMI) 26.0-26.9, adult: Secondary | ICD-10-CM | POA: Diagnosis not present

## 2020-05-07 DIAGNOSIS — G4700 Insomnia, unspecified: Secondary | ICD-10-CM | POA: Diagnosis not present

## 2020-05-07 DIAGNOSIS — I10 Essential (primary) hypertension: Secondary | ICD-10-CM | POA: Diagnosis not present

## 2020-05-07 DIAGNOSIS — Z6826 Body mass index (BMI) 26.0-26.9, adult: Secondary | ICD-10-CM | POA: Diagnosis not present

## 2020-05-07 DIAGNOSIS — Z1331 Encounter for screening for depression: Secondary | ICD-10-CM | POA: Diagnosis not present

## 2020-05-07 DIAGNOSIS — Z79899 Other long term (current) drug therapy: Secondary | ICD-10-CM | POA: Diagnosis not present

## 2020-05-07 DIAGNOSIS — Z0000 Encounter for general adult medical examination without abnormal findings: Secondary | ICD-10-CM | POA: Diagnosis not present

## 2020-06-17 DIAGNOSIS — H524 Presbyopia: Secondary | ICD-10-CM | POA: Diagnosis not present

## 2020-06-17 DIAGNOSIS — Z961 Presence of intraocular lens: Secondary | ICD-10-CM | POA: Diagnosis not present

## 2020-09-17 DIAGNOSIS — R509 Fever, unspecified: Secondary | ICD-10-CM | POA: Diagnosis not present

## 2020-11-03 DIAGNOSIS — Z23 Encounter for immunization: Secondary | ICD-10-CM | POA: Diagnosis not present

## 2020-11-03 DIAGNOSIS — Z6826 Body mass index (BMI) 26.0-26.9, adult: Secondary | ICD-10-CM | POA: Diagnosis not present

## 2020-11-03 DIAGNOSIS — N529 Male erectile dysfunction, unspecified: Secondary | ICD-10-CM | POA: Diagnosis not present

## 2021-03-04 DIAGNOSIS — L821 Other seborrheic keratosis: Secondary | ICD-10-CM | POA: Diagnosis not present

## 2021-04-07 DIAGNOSIS — L03116 Cellulitis of left lower limb: Secondary | ICD-10-CM | POA: Diagnosis not present

## 2021-04-07 DIAGNOSIS — Z6826 Body mass index (BMI) 26.0-26.9, adult: Secondary | ICD-10-CM | POA: Diagnosis not present

## 2021-04-12 ENCOUNTER — Other Ambulatory Visit: Payer: Self-pay | Admitting: Sports Medicine

## 2021-04-12 ENCOUNTER — Other Ambulatory Visit: Payer: Self-pay

## 2021-04-12 ENCOUNTER — Encounter: Payer: Self-pay | Admitting: Sports Medicine

## 2021-04-12 ENCOUNTER — Ambulatory Visit (INDEPENDENT_AMBULATORY_CARE_PROVIDER_SITE_OTHER): Payer: Medicare HMO

## 2021-04-12 ENCOUNTER — Ambulatory Visit: Payer: Medicare HMO | Admitting: Sports Medicine

## 2021-04-12 DIAGNOSIS — M779 Enthesopathy, unspecified: Secondary | ICD-10-CM

## 2021-04-12 DIAGNOSIS — M19072 Primary osteoarthritis, left ankle and foot: Secondary | ICD-10-CM | POA: Diagnosis not present

## 2021-04-12 DIAGNOSIS — I781 Nevus, non-neoplastic: Secondary | ICD-10-CM | POA: Diagnosis not present

## 2021-04-12 DIAGNOSIS — M79672 Pain in left foot: Secondary | ICD-10-CM | POA: Diagnosis not present

## 2021-04-12 DIAGNOSIS — I739 Peripheral vascular disease, unspecified: Secondary | ICD-10-CM

## 2021-04-12 MED ORDER — PREDNISONE 10 MG (21) PO TBPK: ORAL_TABLET | ORAL | 0 refills | Status: DC

## 2021-04-12 NOTE — Progress Notes (Signed)
Subjective: Dustin Ramirez is a 80 y.o. male patient who presents to office for evaluation of left foot pain. Patient complains of progressive pain especially over the last 2 weeks reports that he saw his PCP on last week for the pain and swelling across the top and medial side of the foot states that his PCP put him on doxycycline and it has not helped pain is worse with every step the more that he is on the foot the more it hurts states that he has tried icing and elevating without any relief does not recall any known injury or trauma but does [facility] and then the following day on Sunday having pain that her slowly progress since then no other pedal complaints noted.  Patient is assisted by daughter this visit.  Patient Active Problem List   Diagnosis Date Noted   Sebaceous cyst 05/01/2017   Clot retention of urine 05/12/2016   Malignant neoplasm of prostate ([place]) 03/09/2014   HTN (hypertension) 03/09/2014    Current Outpatient Medications on File Prior to Visit  Medication Sig Dispense Refill   doxycycline (DORYX) 100 MG EC tablet Take 100 mg by mouth 2 (two) times daily.     lisinopril (PRINIVIL,ZESTRIL) 10 MG tablet Take 10 mg by mouth every morning.      omega-3 acid ethyl esters (LOVAZA) 1 g capsule Take 1 g by mouth daily.     No current facility-administered medications on file prior to visit.    Allergies  Allergen Reactions   Penicillins Anaphylaxis and Other (See Comments)    Has patient had a PCN reaction causing immediate rash, facial/tongue/throat swelling, SOB or lightheadedness with hypotension: Yes Has patient had a PCN reaction causing severe rash involving mucus membranes or skin necrosis: No Has patient had a PCN reaction that required hospitalization No Has patient had a PCN reaction occurring within the last 10 years: No If all of the above answers are "NO", then may proceed with Cephalosporin use.    Objective:   General: Alert and oriented x3 in no acute distress  Dermatology: No open lesions bilateral lower extremities, no webspace macerations, no ecchymosis bilateral, all nails x 10 are well manicured.  Localized redness warmth noted to the left foot and lower leg nothing streaking that is concerning for cellulitis this redness is diffuse and patchy and most concerning over area of pain  Vascular: Dorsalis Pedis and Posterior Tibial pedal pulses faintly palpable, Capillary Fill Time 5 seconds, scant pedal hair growth bilateral, 1+ pitting edema bilateral lower extremities, Temperature gradient within normal limits. tenlangiectasias and small broken blood vessels noted bilateral lower extremities  Neurology: Gross sensation intact via light touch bilateral.  Musculoskeletal: Mild tenderness with palpation at tibialis anterior course with worse pain across the dorsal midfoot on the left and plantar first metatarsal head on the left, there is significant weakness with inversion of the foot as well as guarding due to pain with range of motion on the left.  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            -Complete examination performed -Xrays reviewed -Discussed treatement options for likely tendinitis versus capsulitis secondary to underlying arthritis and PVD -Rx prednisone Dosepak to take as directed -Teacher, English as a foreign language boot to the left lower extremity and advised patient to keep clean dry and intact for 5 days after 5 days may remove the [employer] and wear the Surgigrip compression  sleeve to assist with edema control -Dispensed postoperative shoe for patient to wear as directed -Recommend patient to continue with rest ice elevation -Patient to return to office in 7 to 10 days or sooner if condition worsens.  Landis Martins, DPM

## 2021-04-15 DIAGNOSIS — M720 Palmar fascial fibromatosis [Dupuytren]: Secondary | ICD-10-CM | POA: Diagnosis not present

## 2021-04-15 DIAGNOSIS — M13831 Other specified arthritis, right wrist: Secondary | ICD-10-CM | POA: Diagnosis not present

## 2021-04-22 ENCOUNTER — Other Ambulatory Visit: Payer: Self-pay

## 2021-04-22 ENCOUNTER — Ambulatory Visit: Payer: Medicare HMO | Admitting: Sports Medicine

## 2021-04-22 ENCOUNTER — Encounter: Payer: Self-pay | Admitting: Sports Medicine

## 2021-04-22 DIAGNOSIS — M19072 Primary osteoarthritis, left ankle and foot: Secondary | ICD-10-CM | POA: Diagnosis not present

## 2021-04-22 DIAGNOSIS — M779 Enthesopathy, unspecified: Secondary | ICD-10-CM | POA: Diagnosis not present

## 2021-04-22 DIAGNOSIS — I781 Nevus, non-neoplastic: Secondary | ICD-10-CM

## 2021-04-22 DIAGNOSIS — M79672 Pain in left foot: Secondary | ICD-10-CM

## 2021-04-22 DIAGNOSIS — I739 Peripheral vascular disease, unspecified: Secondary | ICD-10-CM

## 2021-04-22 MED ORDER — TRIAMCINOLONE ACETONIDE 10 MG/ML IJ SUSP
10.0000 mg | Freq: Once | INTRAMUSCULAR | Status: AC
  Administered 2021-04-22: 10 mg

## 2021-04-22 NOTE — Progress Notes (Signed)
Subjective: Dustin Ramirez is a 80 y.o. male patient who returns to office for follow-up evaluation of left foot pain.  Patient reports that pain is doing better still has a little soreness but otherwise is doing much better than last visit.  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  No other pedal complaints noted.  Patient Active Problem List   Diagnosis Date Noted   Sebaceous cyst 05/01/2017   Clot retention of urine 05/12/2016   Malignant neoplasm of prostate ([place]) 03/09/2014   HTN (hypertension) 03/09/2014    Current Outpatient Medications on File Prior to Visit  Medication Sig Dispense Refill   doxycycline (DORYX) 100 MG EC tablet Take 100 mg by mouth 2 (two) times daily.     lisinopril (PRINIVIL,ZESTRIL) 10 MG tablet Take 10 mg by mouth every morning.      omega-3 acid ethyl esters (LOVAZA) 1 g capsule Take 1 g by mouth daily.     predniSONE (STERAPRED UNI-PAK 21 TAB) 10 MG (21) TBPK tablet Take as directed 21 tablet 0   No current facility-administered medications on file prior to visit.    Allergies  Allergen Reactions   Penicillins Anaphylaxis and Other (See Comments)    Has patient had a PCN reaction causing immediate rash, facial/tongue/throat swelling, SOB or lightheadedness with hypotension: Yes Has patient had a PCN reaction causing severe rash involving mucus membranes or skin necrosis: No Has patient had a PCN reaction that required hospitalization No Has patient had a PCN reaction occurring within the last 10 years: No If all of the above answers are "NO", then may proceed with Cephalosporin use.    Objective:  General: Alert and oriented x3 in no acute distress  Dermatology: No open lesions bilateral lower extremities, no webspace macerations, no ecchymosis bilateral, all nails x 10 are well  manicured.  Vascular: Dorsalis Pedis and Posterior Tibial pedal pulses faintly palpable, Capillary Fill Time 5 seconds, scant pedal hair growth bilateral, 1+ pitting edema bilateral lower extremities now only focal mostly to the forefoot on the left, Temperature gradient within normal limits. tenlangiectasias and small broken blood vessels noted bilateral lower extremities  Neurology: Gross sensation intact via light touch bilateral.  Musculoskeletal: No reproducible tenderness with palpation at tibialis anterior course on the left foot but there is still mild pain across the dorsal midfoot on the left.  There is no residual pain plantar to the first metatarsal head on the left, there is significant weakness with inversion of the foot with reduced guarding due to improvements with pain on the left.    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 Post-injection care discussed with patient.  -Advised patient to continue with Surgigrip compression sleeve to assist with edema control -Patient may slowly wean  from the postoperative shoe as tolerated to a good supportive shoe that does not rub or irritate the foot and that will accommodate his swelling -Recommend patient to continue with rest ice elevation -Patient to return to office in 1 month if symptoms still persist or sooner if condition worsens.  Landis Martins, DPM

## 2021-05-20 DIAGNOSIS — Z800 Family history of malignant neoplasm of digestive organs: Secondary | ICD-10-CM | POA: Diagnosis not present

## 2021-05-20 DIAGNOSIS — D122 Benign neoplasm of ascending colon: Secondary | ICD-10-CM | POA: Diagnosis not present

## 2021-05-20 DIAGNOSIS — Z86010 Personal history of colonic polyps: Secondary | ICD-10-CM | POA: Diagnosis not present

## 2021-05-20 DIAGNOSIS — Z1211 Encounter for screening for malignant neoplasm of colon: Secondary | ICD-10-CM | POA: Diagnosis not present

## 2021-05-20 DIAGNOSIS — K635 Polyp of colon: Secondary | ICD-10-CM | POA: Diagnosis not present

## 2021-05-25 ENCOUNTER — Other Ambulatory Visit: Payer: Self-pay

## 2021-05-25 ENCOUNTER — Encounter: Payer: Self-pay | Admitting: Sports Medicine

## 2021-05-25 ENCOUNTER — Ambulatory Visit: Payer: Medicare HMO | Admitting: Sports Medicine

## 2021-05-25 DIAGNOSIS — M19072 Primary osteoarthritis, left ankle and foot: Secondary | ICD-10-CM

## 2021-05-25 DIAGNOSIS — I781 Nevus, non-neoplastic: Secondary | ICD-10-CM | POA: Diagnosis not present

## 2021-05-25 DIAGNOSIS — I739 Peripheral vascular disease, unspecified: Secondary | ICD-10-CM

## 2021-05-25 DIAGNOSIS — M79672 Pain in left foot: Secondary | ICD-10-CM | POA: Diagnosis not present

## 2021-05-25 DIAGNOSIS — M779 Enthesopathy, unspecified: Secondary | ICD-10-CM | POA: Diagnosis not present

## 2021-05-25 NOTE — Progress Notes (Signed)
Subjective: Dustin Ramirez is a 80 y.o. male patient who returns to office for follow-up evaluation of left foot pain.  Patient reports that pain is doing better has no soreness and no swelling and has stopped wearing the compression sock for the last week because he has been doing good with no pain or swelling.  Patient denies any other pedal complaints at this time.  Reports that he has an upcoming appointment to see a hand doctor about his Dupuytren's contracture.  Reports that he recently had a colonoscopy procedure that went well.  Patient Active Problem List   Diagnosis Date Noted   Sebaceous cyst 05/01/2017   Clot retention of urine 05/12/2016   Malignant neoplasm of prostate ([place]) 03/09/2014   HTN (hypertension) 03/09/2014    Current Outpatient Medications on File Prior to Visit  Medication Sig Dispense Refill   doxycycline (DORYX) 100 MG EC tablet Take 100 mg by mouth 2 (two) times daily.     lisinopril (PRINIVIL,ZESTRIL) 10 MG tablet Take 10 mg by mouth every morning.      omega-3 acid ethyl esters (LOVAZA) 1 g capsule Take 1 g by mouth daily.     predniSONE (STERAPRED UNI-PAK 21 TAB) 10 MG (21) TBPK tablet Take as directed 21 tablet 0   No current facility-administered medications on file prior to visit.    Allergies  Allergen Reactions   Penicillins Anaphylaxis and Other (See Comments)    Has patient had a PCN reaction causing immediate rash, facial/tongue/throat swelling, SOB or lightheadedness with hypotension: Yes Has patient had a PCN reaction causing severe rash involving mucus membranes or skin necrosis: No Has patient had a PCN reaction that required hospitalization No Has patient had a PCN reaction occurring within the last 10 years: No If all of the above answers are "NO", then may proceed with Cephalosporin use.    Objective:  General: Alert and oriented x3 in no acute distress  Dermatology: No open lesions bilateral lower extremities, no webspace  macerations, no ecchymosis bilateral, all nails x 10 are well manicured.  Vascular: Dorsalis Pedis and Posterior Tibial pedal pulses faintly palpable, Capillary Fill Time 5 seconds, scant pedal hair growth bilateral, no edema. tenlangiectasias and small broken blood vessels noted bilateral lower extremities  Neurology: Gross sensation intact via light touch bilateral.  Musculoskeletal: No reproducible tenderness with palpation to the left foot.  Strength adequate due to patient status.    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  Landis Martins, DPM

## 2021-05-30 DIAGNOSIS — M720 Palmar fascial fibromatosis [Dupuytren]: Secondary | ICD-10-CM | POA: Diagnosis not present

## 2021-06-01 DIAGNOSIS — M720 Palmar fascial fibromatosis [Dupuytren]: Secondary | ICD-10-CM | POA: Diagnosis not present

## 2021-06-01 DIAGNOSIS — M25641 Stiffness of right hand, not elsewhere classified: Secondary | ICD-10-CM | POA: Diagnosis not present

## 2021-06-15 DIAGNOSIS — M720 Palmar fascial fibromatosis [Dupuytren]: Secondary | ICD-10-CM | POA: Diagnosis not present

## 2021-06-15 DIAGNOSIS — M25641 Stiffness of right hand, not elsewhere classified: Secondary | ICD-10-CM | POA: Diagnosis not present

## 2021-06-22 DIAGNOSIS — M25641 Stiffness of right hand, not elsewhere classified: Secondary | ICD-10-CM | POA: Diagnosis not present

## 2021-07-06 ENCOUNTER — Ambulatory Visit: Payer: Medicare HMO | Admitting: Sports Medicine

## 2021-07-20 DIAGNOSIS — R600 Localized edema: Secondary | ICD-10-CM | POA: Diagnosis not present

## 2021-07-22 ENCOUNTER — Ambulatory Visit: Payer: Medicare HMO | Admitting: Sports Medicine

## 2021-07-29 ENCOUNTER — Ambulatory Visit: Payer: Medicare HMO | Admitting: Sports Medicine

## 2021-08-09 ENCOUNTER — Ambulatory Visit: Payer: Medicare HMO | Admitting: Sports Medicine

## 2021-08-09 ENCOUNTER — Other Ambulatory Visit: Payer: Self-pay

## 2021-08-09 ENCOUNTER — Encounter: Payer: Self-pay | Admitting: Sports Medicine

## 2021-08-09 DIAGNOSIS — R600 Localized edema: Secondary | ICD-10-CM | POA: Diagnosis not present

## 2021-08-09 DIAGNOSIS — I739 Peripheral vascular disease, unspecified: Secondary | ICD-10-CM

## 2021-08-09 DIAGNOSIS — I781 Nevus, non-neoplastic: Secondary | ICD-10-CM | POA: Diagnosis not present

## 2021-08-09 MED ORDER — FUROSEMIDE 20 MG PO TABS: 20.0000 mg | ORAL_TABLET | Freq: Every day | ORAL | 0 refills | Status: DC

## 2021-08-09 NOTE — Progress Notes (Signed)
Subjective: Dustin Ramirez is a 80 y.o. male patient who returns to office for follow-up evaluation of swelling, this time reports that swelling is worse on the right, started after he took a trip to [place]. States that he saw a foot doctor there and got an Ultrasound which as negative and was given lasix which has helped. Denies any pain but is wondering why he keeps having swelling.   Patient Active Problem List   Diagnosis Date Noted   Sebaceous cyst 05/01/2017   Clot retention of urine 05/12/2016   Malignant neoplasm of prostate ([place]) 03/09/2014   HTN (hypertension) 03/09/2014    Current Outpatient Medications on File Prior to Visit  Medication Sig Dispense Refill   doxycycline (DORYX) 100 MG EC tablet Take 100 mg by mouth 2 (two) times daily.     lisinopril (PRINIVIL,ZESTRIL) 10 MG tablet Take 10 mg by mouth every morning.      omega-3 acid ethyl esters (LOVAZA) 1 g capsule Take 1 g by mouth daily.     predniSONE (STERAPRED UNI-PAK 21 TAB) 10 MG (21) TBPK tablet Take as directed 21 tablet 0   No current facility-administered medications on file prior to visit.    Allergies  Allergen Reactions   Penicillins Anaphylaxis and Other (See Comments)    Has patient had a PCN reaction causing immediate rash, facial/tongue/throat swelling, SOB or lightheadedness with hypotension: Yes Has patient had a PCN reaction causing severe rash involving mucus membranes or skin necrosis: No Has patient had a PCN reaction that required hospitalization No Has patient had a PCN reaction occurring within the last 10 years: No If all of the above answers are "NO", then may proceed with Cephalosporin use.    Objective:  General: Alert and oriented x3 in no acute distress  Dermatology: No open lesions bilateral lower extremities, no webspace macerations, no ecchymosis bilateral, all nails x 10 are well manicured.  Vascular: Dorsalis Pedis and Posterior Tibial pedal pulses faintly palpable, Capillary  Fill Time 5 seconds, scant pedal hair growth bilateral, +1 pitting edema bilateral, tenlangiectasias and small broken blood vessels noted bilateral lower extremities  Neurology: Gross sensation intact via light touch bilateral.  Musculoskeletal: No reproducible tenderness with palpation to the right foot or left.  Strength adequate due to patient status.    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       Relevant Medications   furosemide (LASIX) 20 MG tablet        -Complete examination performed -Discussed edema/PVD -Advised patient to resume wearing compression; dispensed surgigrip compression sleeves to use as directed -Advised patient to elevate legs -Advised patient to periodically move/walk around to help with exercising the calves -Refilled lasix for swelling until patient can get back in to see PCP -Discussed above with his daughter stephanie over the phone -Advised patient is swelling fails to improve after 4-6 weeks we can order venous reflux studies however at this time recommend for patient to continue with above -Patient to return to office as needed or sooner if condition worsens.  Landis Martins, DPM

## 2021-08-10 DIAGNOSIS — Z6826 Body mass index (BMI) 26.0-26.9, adult: Secondary | ICD-10-CM | POA: Diagnosis not present

## 2021-08-10 DIAGNOSIS — L259 Unspecified contact dermatitis, unspecified cause: Secondary | ICD-10-CM | POA: Diagnosis not present

## 2021-08-19 ENCOUNTER — Other Ambulatory Visit: Payer: Self-pay | Admitting: Orthopedic Surgery

## 2021-08-19 DIAGNOSIS — G8918 Other acute postprocedural pain: Secondary | ICD-10-CM | POA: Diagnosis not present

## 2021-08-19 DIAGNOSIS — M720 Palmar fascial fibromatosis [Dupuytren]: Secondary | ICD-10-CM | POA: Diagnosis not present

## 2021-08-29 DIAGNOSIS — R29898 Other symptoms and signs involving the musculoskeletal system: Secondary | ICD-10-CM | POA: Diagnosis not present

## 2021-09-05 DIAGNOSIS — R29898 Other symptoms and signs involving the musculoskeletal system: Secondary | ICD-10-CM | POA: Diagnosis not present

## 2021-09-08 DIAGNOSIS — Z23 Encounter for immunization: Secondary | ICD-10-CM | POA: Diagnosis not present

## 2021-09-13 DIAGNOSIS — R29898 Other symptoms and signs involving the musculoskeletal system: Secondary | ICD-10-CM | POA: Diagnosis not present

## 2021-09-20 DIAGNOSIS — R29898 Other symptoms and signs involving the musculoskeletal system: Secondary | ICD-10-CM | POA: Diagnosis not present

## 2021-10-08 ENCOUNTER — Other Ambulatory Visit: Payer: Self-pay | Admitting: Sports Medicine

## 2021-10-12 ENCOUNTER — Other Ambulatory Visit: Payer: Self-pay | Admitting: Sports Medicine

## 2021-10-12 ENCOUNTER — Telehealth: Payer: Self-pay | Admitting: Sports Medicine

## 2021-10-12 DIAGNOSIS — I739 Peripheral vascular disease, unspecified: Secondary | ICD-10-CM

## 2021-10-12 DIAGNOSIS — R600 Localized edema: Secondary | ICD-10-CM

## 2021-10-12 NOTE — Progress Notes (Signed)
Ordered venous reflux studies to evaluate extent of lower extremity swelling. Patient to follow up after this test is done.

## 2021-10-12 NOTE — Telephone Encounter (Signed)
Pt states swelling is no better-per last ov note you wanted to order u/s if no better.  I told pt I would check with you if you wanted test before seeing pt.

## 2021-10-13 NOTE — Telephone Encounter (Signed)
Pt notified and understands/reb °

## 2021-10-19 ENCOUNTER — Telehealth: Payer: Self-pay | Admitting: Sports Medicine

## 2021-10-19 ENCOUNTER — Telehealth: Payer: Self-pay | Admitting: *Deleted

## 2021-10-19 NOTE — Telephone Encounter (Signed)
-----   Message from [place], [place] sent at 10/19/2021  7:17 AM EST ----- Regarding: Venous duplex study results Please let patient know that his ultrasound of his veins came back normal. There is no blockage or clot in the deep veins. His swelling can be the result of his superficial veins or other concerns like his heart, kidney, liver. If he still has concerns we can refer him to a vein and vascular doctor in [place] for a second opinion or he can discuss further with PCP. Thanks Dr. Chauncey Cruel

## 2021-10-19 NOTE — Telephone Encounter (Signed)
Pt attempted to have ultrasound completed today, and was unable to be seen. She would like for someone to give her a call, so she can figure out what's going on.   Please advise.

## 2021-10-19 NOTE — Telephone Encounter (Signed)
Called and left a message for the patient's daughter Colletta Maryland) and relayed the message per Dr Cannon Kettle. Lattie Haw

## 2021-10-20 ENCOUNTER — Telehealth: Payer: Self-pay | Admitting: Sports Medicine

## 2021-10-20 NOTE — Telephone Encounter (Signed)
Patient daughter called and stated that her and her brother are meeting with her dad pcp Dr. Helene Kelp in [place]. She is needing a copy of his previous ultrasound that was done in [place] in October 2022. Colletta Maryland is requesting a copy of the ultrasound sent to Dr. Helene Kelp today preferably by lunch time. If she needs to come by the office to pick it up at the [facility] she can do that as well.  Colletta Maryland (daughter) Office Number [phone]

## 2021-10-20 NOTE — Telephone Encounter (Signed)
Thank you :)

## 2021-10-20 NOTE — Telephone Encounter (Signed)
Spoke with Stephanie-results faxed to Dr Helene Kelp

## 2021-10-24 ENCOUNTER — Observation Stay (HOSPITAL_COMMUNITY): Payer: Medicare HMO

## 2021-10-24 ENCOUNTER — Other Ambulatory Visit: Payer: Self-pay

## 2021-10-24 ENCOUNTER — Inpatient Hospital Stay (HOSPITAL_COMMUNITY)
Admission: EM | Admit: 2021-10-24 | Discharge: 2021-10-29 | DRG: 432 | Disposition: A | Discharge status: Skilled Nursing Facility | Payer: Medicare HMO | Attending: Internal Medicine | Admitting: Internal Medicine

## 2021-10-24 DIAGNOSIS — R531 Weakness: Secondary | ICD-10-CM | POA: Diagnosis not present

## 2021-10-24 DIAGNOSIS — I10 Essential (primary) hypertension: Secondary | ICD-10-CM | POA: Diagnosis present

## 2021-10-24 DIAGNOSIS — R5381 Other malaise: Secondary | ICD-10-CM | POA: Diagnosis present

## 2021-10-24 DIAGNOSIS — K31819 Angiodysplasia of stomach and duodenum without bleeding: Secondary | ICD-10-CM

## 2021-10-24 DIAGNOSIS — D649 Anemia, unspecified: Secondary | ICD-10-CM | POA: Diagnosis not present

## 2021-10-24 DIAGNOSIS — D696 Thrombocytopenia, unspecified: Secondary | ICD-10-CM | POA: Diagnosis present

## 2021-10-24 DIAGNOSIS — Z7401 Bed confinement status: Secondary | ICD-10-CM | POA: Diagnosis not present

## 2021-10-24 DIAGNOSIS — Z7982 Long term (current) use of aspirin: Secondary | ICD-10-CM

## 2021-10-24 DIAGNOSIS — I959 Hypotension, unspecified: Secondary | ICD-10-CM | POA: Diagnosis not present

## 2021-10-24 DIAGNOSIS — K824 Cholesterolosis of gallbladder: Secondary | ICD-10-CM | POA: Diagnosis not present

## 2021-10-24 DIAGNOSIS — F1010 Alcohol abuse, uncomplicated: Secondary | ICD-10-CM | POA: Diagnosis present

## 2021-10-24 DIAGNOSIS — R0602 Shortness of breath: Secondary | ICD-10-CM | POA: Diagnosis not present

## 2021-10-24 DIAGNOSIS — K766 Portal hypertension: Secondary | ICD-10-CM | POA: Diagnosis present

## 2021-10-24 DIAGNOSIS — R278 Other lack of coordination: Secondary | ICD-10-CM | POA: Diagnosis present

## 2021-10-24 DIAGNOSIS — R000 Tachycardia, unspecified: Secondary | ICD-10-CM | POA: Diagnosis not present

## 2021-10-24 DIAGNOSIS — K3189 Other diseases of stomach and duodenum: Secondary | ICD-10-CM

## 2021-10-24 DIAGNOSIS — D6959 Other secondary thrombocytopenia: Secondary | ICD-10-CM | POA: Diagnosis present

## 2021-10-24 DIAGNOSIS — R079 Chest pain, unspecified: Secondary | ICD-10-CM | POA: Diagnosis present

## 2021-10-24 DIAGNOSIS — I739 Peripheral vascular disease, unspecified: Secondary | ICD-10-CM | POA: Diagnosis present

## 2021-10-24 DIAGNOSIS — Z20822 Contact with and (suspected) exposure to covid-19: Secondary | ICD-10-CM | POA: Diagnosis present

## 2021-10-24 DIAGNOSIS — K2970 Gastritis, unspecified, without bleeding: Secondary | ICD-10-CM

## 2021-10-24 DIAGNOSIS — K7031 Alcoholic cirrhosis of liver with ascites: Secondary | ICD-10-CM | POA: Diagnosis present

## 2021-10-24 DIAGNOSIS — Z79899 Other long term (current) drug therapy: Secondary | ICD-10-CM | POA: Diagnosis not present

## 2021-10-24 DIAGNOSIS — R7989 Other specified abnormal findings of blood chemistry: Secondary | ICD-10-CM | POA: Diagnosis present

## 2021-10-24 DIAGNOSIS — R0789 Other chest pain: Secondary | ICD-10-CM | POA: Diagnosis not present

## 2021-10-24 DIAGNOSIS — Z66 Do not resuscitate: Secondary | ICD-10-CM | POA: Diagnosis present

## 2021-10-24 DIAGNOSIS — R0600 Dyspnea, unspecified: Secondary | ICD-10-CM

## 2021-10-24 DIAGNOSIS — R188 Other ascites: Secondary | ICD-10-CM | POA: Diagnosis not present

## 2021-10-24 DIAGNOSIS — K659 Peritonitis, unspecified: Secondary | ICD-10-CM

## 2021-10-24 DIAGNOSIS — I8500 Esophageal varices without bleeding: Secondary | ICD-10-CM

## 2021-10-24 DIAGNOSIS — I8510 Secondary esophageal varices without bleeding: Secondary | ICD-10-CM | POA: Diagnosis present

## 2021-10-24 DIAGNOSIS — K7460 Unspecified cirrhosis of liver: Secondary | ICD-10-CM | POA: Diagnosis not present

## 2021-10-24 DIAGNOSIS — C61 Malignant neoplasm of prostate: Secondary | ICD-10-CM | POA: Diagnosis not present

## 2021-10-24 DIAGNOSIS — K760 Fatty (change of) liver, not elsewhere classified: Secondary | ICD-10-CM | POA: Diagnosis present

## 2021-10-24 DIAGNOSIS — R279 Unspecified lack of coordination: Secondary | ICD-10-CM | POA: Diagnosis not present

## 2021-10-24 DIAGNOSIS — K219 Gastro-esophageal reflux disease without esophagitis: Secondary | ICD-10-CM | POA: Diagnosis not present

## 2021-10-24 DIAGNOSIS — N179 Acute kidney failure, unspecified: Secondary | ICD-10-CM | POA: Diagnosis present

## 2021-10-24 DIAGNOSIS — Z880 Allergy status to penicillin: Secondary | ICD-10-CM | POA: Diagnosis not present

## 2021-10-24 DIAGNOSIS — N400 Enlarged prostate without lower urinary tract symptoms: Secondary | ICD-10-CM | POA: Diagnosis present

## 2021-10-24 DIAGNOSIS — Z87891 Personal history of nicotine dependence: Secondary | ICD-10-CM | POA: Diagnosis not present

## 2021-10-24 DIAGNOSIS — Z8546 Personal history of malignant neoplasm of prostate: Secondary | ICD-10-CM | POA: Diagnosis not present

## 2021-10-24 DIAGNOSIS — K7682 Hepatic encephalopathy: Secondary | ICD-10-CM | POA: Diagnosis present

## 2021-10-24 DIAGNOSIS — D509 Iron deficiency anemia, unspecified: Secondary | ICD-10-CM | POA: Diagnosis present

## 2021-10-24 DIAGNOSIS — J9811 Atelectasis: Secondary | ICD-10-CM | POA: Diagnosis not present

## 2021-10-24 DIAGNOSIS — R161 Splenomegaly, not elsewhere classified: Secondary | ICD-10-CM | POA: Diagnosis not present

## 2021-10-24 DIAGNOSIS — R739 Hyperglycemia, unspecified: Secondary | ICD-10-CM | POA: Diagnosis not present

## 2021-10-24 DIAGNOSIS — M6281 Muscle weakness (generalized): Secondary | ICD-10-CM | POA: Diagnosis not present

## 2021-10-24 DIAGNOSIS — E785 Hyperlipidemia, unspecified: Secondary | ICD-10-CM | POA: Diagnosis not present

## 2021-10-24 DIAGNOSIS — R140 Abdominal distension (gaseous): Secondary | ICD-10-CM | POA: Diagnosis present

## 2021-10-24 DIAGNOSIS — R2681 Unsteadiness on feet: Secondary | ICD-10-CM | POA: Diagnosis not present

## 2021-10-24 LAB — CBC WITH DIFFERENTIAL/PLATELET
Abs Immature Granulocytes: 0.01 10*3/uL (ref 0.00–0.07)
Basophils Absolute: 0.1 10*3/uL (ref 0.0–0.1)
Basophils Relative: 1 %
Eosinophils Absolute: 0.1 10*3/uL (ref 0.0–0.5)
Eosinophils Relative: 2 %
HCT: 21.6 % — ABNORMAL LOW (ref 39.0–52.0)
Hemoglobin: 6.3 g/dL — CL (ref 13.0–17.0)
Immature Granulocytes: 0 %
Lymphocytes Relative: 24 %
Lymphs Abs: 1.1 10*3/uL (ref 0.7–4.0)
MCH: 25.3 pg — ABNORMAL LOW (ref 26.0–34.0)
MCHC: 29.2 g/dL — ABNORMAL LOW (ref 30.0–36.0)
MCV: 86.7 fL (ref 80.0–100.0)
Monocytes Absolute: 0.4 10*3/uL (ref 0.1–1.0)
Monocytes Relative: 10 %
Neutro Abs: 2.8 10*3/uL (ref 1.7–7.7)
Neutrophils Relative %: 63 %
Platelets: 112 10*3/uL — ABNORMAL LOW (ref 150–400)
RBC: 2.49 MIL/uL — ABNORMAL LOW (ref 4.22–5.81)
RDW: 15.5 % (ref 11.5–15.5)
Smear Review: DECREASED
WBC: 4.4 10*3/uL (ref 4.0–10.5)
nRBC: 0 % (ref 0.0–0.2)

## 2021-10-24 LAB — RESP PANEL BY RT-PCR (FLU A&B, COVID) ARPGX2
Influenza A by PCR: NEGATIVE
Influenza B by PCR: NEGATIVE
SARS Coronavirus 2 by RT PCR: NEGATIVE

## 2021-10-24 LAB — BASIC METABOLIC PANEL
Anion gap: 10 (ref 5–15)
BUN: 18 mg/dL (ref 8–23)
CO2: 22 mmol/L (ref 22–32)
Calcium: 9.2 mg/dL (ref 8.9–10.3)
Chloride: 107 mmol/L (ref 98–111)
Creatinine, Ser: 1.53 mg/dL — ABNORMAL HIGH (ref 0.61–1.24)
GFR, Estimated: 46 mL/min — ABNORMAL LOW (ref >60)
Glucose, Bld: 229 mg/dL — ABNORMAL HIGH (ref 70–99)
Potassium: 4.1 mmol/L (ref 3.5–5.1)
Sodium: 139 mmol/L (ref 135–145)

## 2021-10-24 LAB — HEPATIC FUNCTION PANEL
ALT: 20 U/L (ref 0–44)
AST: 38 U/L (ref 15–41)
Albumin: 2.6 g/dL — ABNORMAL LOW (ref 3.5–5.0)
Alkaline Phosphatase: 68 U/L (ref 38–126)
Bilirubin, Direct: 0.3 mg/dL — ABNORMAL HIGH (ref 0.0–0.2)
Indirect Bilirubin: 0.5 mg/dL (ref 0.3–0.9)
Total Bilirubin: 0.8 mg/dL (ref 0.3–1.2)
Total Protein: 7 g/dL (ref 6.5–8.1)

## 2021-10-24 LAB — ABO/RH: ABO/RH(D): O POS

## 2021-10-24 LAB — BRAIN NATRIURETIC PEPTIDE: B Natriuretic Peptide: 210.3 pg/mL — ABNORMAL HIGH (ref 0.0–100.0)

## 2021-10-24 LAB — PREPARE RBC (CROSSMATCH)

## 2021-10-24 LAB — POC OCCULT BLOOD, ED: Fecal Occult Bld: NEGATIVE

## 2021-10-24 MED ORDER — ACETAMINOPHEN 650 MG RE SUPP: 650.0000 mg | Freq: Four times a day (QID) | RECTAL | Status: DC | PRN

## 2021-10-24 MED ORDER — ACETAMINOPHEN 325 MG PO TABS
650.0000 mg | ORAL_TABLET | Freq: Four times a day (QID) | ORAL | Status: DC | PRN
  Administered 2021-10-26 – 2021-10-29 (×4): 650 mg via ORAL
  Filled 2021-10-24 (×4): qty 2

## 2021-10-24 MED ORDER — PANTOPRAZOLE INFUSION (NEW) - SIMPLE MED
8.0000 mg/h | INTRAVENOUS | Status: DC
  Administered 2021-10-24: 8 mg/h via INTRAVENOUS
  Filled 2021-10-24: qty 80

## 2021-10-24 MED ORDER — SODIUM CHLORIDE 0.9% FLUSH
3.0000 mL | Freq: Two times a day (BID) | INTRAVENOUS | Status: DC
  Administered 2021-10-27 – 2021-10-29 (×5): 3 mL via INTRAVENOUS

## 2021-10-24 MED ORDER — SODIUM CHLORIDE 0.9 % IV SOLN
10.0000 mL/h | Freq: Once | INTRAVENOUS | Status: AC
  Administered 2021-10-24: 10 mL/h via INTRAVENOUS

## 2021-10-24 MED ORDER — PANTOPRAZOLE SODIUM 40 MG IV SOLR: 40.0000 mg | Freq: Two times a day (BID) | INTRAVENOUS | Status: DC

## 2021-10-24 MED ORDER — PANTOPRAZOLE 80MG IVPB - SIMPLE MED
80.0000 mg | Freq: Once | INTRAVENOUS | Status: AC
  Administered 2021-10-24: 80 mg via INTRAVENOUS
  Filled 2021-10-24: qty 80

## 2021-10-24 MED ORDER — ONDANSETRON HCL 4 MG PO TABS: 4.0000 mg | ORAL_TABLET | Freq: Four times a day (QID) | ORAL | Status: DC | PRN

## 2021-10-24 MED ORDER — ONDANSETRON HCL 4 MG/2ML IJ SOLN: 4.0000 mg | Freq: Four times a day (QID) | INTRAMUSCULAR | Status: DC | PRN

## 2021-10-24 NOTE — Assessment & Plan Note (Signed)
Although FOBT negative patient describes episodes of melena about 1 week prior to admit.  He does take Advil and low-dose aspirin daily.  Hemoglobin 6.3 on admission. -Transfusing 1 unit PRBC -Check anemia panel -May benefit from GI evaluation in the morning -Hold aspirin, NSAIDs, pharmacologic VTE prophylaxis -Continue IV Protonix 40 mg twice daily

## 2021-10-24 NOTE — ED Triage Notes (Signed)
Sick last weekend for 2 days with stomach issues. Went for routine labwork today and called the daughter to bring him to the ED. Has swelling in his feet also. Generalized weakness acorrding to the daughter

## 2021-10-24 NOTE — ED Provider Notes (Signed)
[facility] EMERGENCY DEPARTMENT Provider Note   CSN: [id number] Arrival date & time: 10/24/21  1133     History  Chief Complaint  Patient presents with   Weakness    Dustin Ramirez is a 81 y.o. male.  HPI Patient presents with his daughter who assists with history. Presents due to weakness, fatigue, pallor.  Onset was about 9 days ago.  Initially patient felt nauseous, with anorexia, and for the first 3 days was particularly symptomatic, and noticed onset of melena.  There is no abdominal pain.  Now over the interval the patient has had no ongoing melena, has recurrence of normal color stool, but feels worsening weakness throughout, without focality, without syncope, without fall.  No clear alleviating or exacerbating factors.  No recent colonoscopy.  Additional details available on primary care paperwork, with labs performed yesterday.    Home Medications Prior to Admission medications   Medication Sig Start Date End Date Taking? Authorizing Provider  doxycycline (DORYX) 100 MG EC tablet Take 100 mg by mouth 2 (two) times daily.    [provider]  furosemide (LASIX) 20 MG tablet TAKE 1 TABLET BY MOUTH DAILY. 10/09/21   Landis Martins, DPM  lisinopril (PRINIVIL,ZESTRIL) 10 MG tablet Take 10 mg by mouth every morning.     [provider]  omega-3 acid ethyl esters (LOVAZA) 1 g capsule Take 1 g by mouth daily.    [provider]  predniSONE (STERAPRED UNI-PAK 21 TAB) 10 MG (21) TBPK tablet Take as directed 04/12/21   Landis Martins, DPM      Allergies    Penicillins    Review of Systems   Review of Systems  Constitutional:        Per HPI, otherwise negative  HENT:         Per HPI, otherwise negative  Respiratory:         Per HPI, otherwise negative  Cardiovascular:        Per HPI, otherwise negative  Gastrointestinal:  Negative for vomiting.  Endocrine:       Negative aside from HPI  Genitourinary:        Neg aside from HPI    Musculoskeletal:        Per HPI, otherwise negative  Skin: Negative.   Neurological:  Negative for syncope.   Physical Exam Updated Vital Signs BP 131/65 (BP Location: Right Arm)    Pulse 100    Temp 98.2 F (36.8 C) (Oral)    Resp 19    SpO2 100%  Physical Exam Vitals and nursing note reviewed.  Constitutional:      General: He is not in acute distress.    Appearance: He is well-developed.  HENT:     Head: Normocephalic and atraumatic.  Eyes:     Conjunctiva/sclera: Conjunctivae normal.  Cardiovascular:     Rate and Rhythm: Normal rate and regular rhythm.  Pulmonary:     Effort: Pulmonary effort is normal. No respiratory distress.     Breath sounds: No stridor.  Abdominal:     General: There is no distension.     Tenderness: There is no abdominal tenderness. There is no guarding.  Genitourinary:    Rectum: Guaiac result negative.  Skin:    General: Skin is warm and dry.  Neurological:     Mental Status: He is alert and oriented to person, place, and time.    ED Results / Procedures / Treatments   Labs (all labs ordered are  listed, but only abnormal results are displayed) Labs Reviewed  CBC WITH DIFFERENTIAL/PLATELET - Abnormal; Notable for the following components:      Result Value   RBC 2.49 (*)    Hemoglobin 6.3 (*)    HCT 21.6 (*)    MCH 25.3 (*)    MCHC 29.2 (*)    Platelets 112 (*)    All other components within normal limits  BASIC METABOLIC PANEL - Abnormal; Notable for the following components:   Glucose, Bld 229 (*)    Creatinine, Ser 1.53 (*)    GFR, Estimated 46 (*)    All other components within normal limits  HEPATIC FUNCTION PANEL - Abnormal; Notable for the following components:   Albumin 2.6 (*)    Bilirubin, Direct 0.3 (*)    All other components within normal limits  BRAIN NATRIURETIC PEPTIDE - Abnormal; Notable for the following components:   B Natriuretic Peptide 210.3 (*)    All other components within normal limits  RESP PANEL BY  RT-PCR (FLU A&B, COVID) ARPGX2  POC OCCULT BLOOD, ED  TYPE AND SCREEN  ABO/RH  PREPARE RBC (CROSSMATCH)     Procedures Procedures    Medications Ordered in ED Medications  0.9 %  sodium chloride infusion (has no administration in time range)    ED Course/ Medical Decision Making/ A&P Discern for infection versus electrolyte abnormality versus dehydration versus GI bleed versus atypical ACS/dysrhythmia.  Patient placed on continuous cardiac monitoring, pulse oximetry. Cardiac 110 sinus tach abnormal Pulse ox 98% room air normal  Update:, Initial labs notable for hemoglobin 6.2, consistent with value from outside hospital that I discussed the patient's daughter after arrival.  Hemoccult negative, but with concern for symptomatic anemia the patient is amenable to blood transfusion, this has been initiated.  5:55 PM Patient remains tachycardic, he and daughter are aware of findings, Hemoccult negative status, but symptomatic anemia requiring transfusion, admission.                          Medical Decision Making Adult male presents with weakness, is found to have critically abnormal hemoglobin value.  Some suspicion given his history of prior melena/GI bleed, but no current evidence for ongoing bleeding, with grossly negative, Hemoccult negative stool.  With a soft, nontender abdomen there are some suspicion for an episode of illness 1 week ago, without full recovery, prompting today's evaluation, hypotension, tachycardia and anemia, requiring fluids, blood transfusion, admission.  Amount and/or Complexity of Data Reviewed Independent Historian: caregiver External Data Reviewed: notes.    Details: Primary care notes with outpatient labs notable for hemoglobin less than 7 earlier today Labs: ordered. Decision-making details documented in ED Course. Discussion of management or test interpretation with external provider(s): I discussed patient's case with our internal medicine  colleagues  Risk Prescription drug management. Decision regarding hospitalization. Risk Details: With concern for critical abnormal hemoglobin value patient required transfusion, admission, continuous monitoring, management, resuscitation.  Critical Care Total time providing critical care: 30-74 minutes (35)   Final Clinical Impression(s) / ED Diagnoses Final diagnoses:  Weakness  Symptomatic anemia      Carmin Muskrat, MD 10/24/21 1757

## 2021-10-24 NOTE — H&P (Addendum)
History and Physical    Dustin Ramirez ZYY:[id number] DOB: 01/20/1941 DOA: 10/24/2021  PCP: Ronita Hipps, MD  Patient coming from: Home  I have personally briefly reviewed patient's old medical records in [place]  Chief Complaint: Weakness, fatigue, dark stools  HPI: Dustin Ramirez is a 81 y.o. male with medical history significant for prostate cancer s/p radiation seeding, HTN, PVD who presented to the ED for evaluation of fatigue and dark stools.  History is supplemented by patient's daughter at bedside.  Patient states that the weekend of January 14-15th he was very ill with gastroenteritis symptoms.  He was having loose dark black stools, weakness, fatigue, and poor appetite.  He says he was essentially bedbound for about 2 days before he began to feel better.  He says he was not having any abdominal pain but does feel that his abdomen is more distended than normal.  He reports some difficulty with urination due to his history of prostate cancer but otherwise has good urine output without obvious hematuria.  He also reports swelling to both his lower extremities for at least 2 weeks now.  Right leg has been more swollen than the left.  He says he saw podiatrist and underwent an ultrasound on his right leg which did not show any evidence of DVT.  He has been having exertional dyspnea.  He also reports exertional chest pain after activity such as taking out the trash which is new.  Pain is across his chest and described as a sharp/pressure-like sensation.  He denies any prior known cardiac history.  Patient states he does take Advil nightly, he does not know the dose.  He also takes aspirin 81 mg daily.  His only other regular medication is lisinopril.  ED Course:  Initial vitals showed BP 143/61, pulse 110, RR 16, temp 98.2 F, SPO2 99% on room air.  Labs significant for hemoglobin 6.3 (previously 11.02 April 2018), hematocrit 21.6, MCV 86.7, platelets 112,000, WBC 4.4, sodium 139,  potassium 4.1, bicarb 22, BUN 18, creatinine 1.53 (1.06 in 2017), serum glucose 229, BNP 210.3.  SARS-CoV-2 and influenza PCR negative.  FOBT was negative.  Patient was ordered to receive 1 unit PRBC transfusion and started on IV Protonix bolus followed by continuous infusion.  The hospitalist service was consulted to admit for further evaluation and management.  Review of Systems: All systems reviewed and are negative except as documented in history of present illness above.   Past Medical History:  Diagnosis Date   ED (erectile dysfunction)    History of urinary retention    Hypertension    Incomplete emptying of bladder    Prostate cancer [facility]) urologist-  dr Karsten Ro   dx 2015  s/p  radiative prostate seed implants  05-08-2014   Prostate hyperplasia with urinary obstruction    Wears contact lenses     Past Surgical History:  Procedure Laterality Date   CYSTOSCOPY N/A 08/17/2014   Procedure: CYSTOSCOPY;  Surgeon: [name], MD;  Location: [facility];  Service: Urology;  Laterality: N/A;   CYSTOSCOPY WITH RETROGRADE URETHROGRAM N/A 04/01/2018   Procedure: CYSTOSCOPY WITH RETROGRADE URETHROGRAM/ DILATION;  Surgeon: Kathie Rhodes, MD;  Location: [address];  Service: Urology;  Laterality: N/A;  ONLY NEEDS 30 MIN   INSERTION OF SUPRAPUBIC CATHETER N/A 08/17/2014   Procedure: INSERTION OF SUPRAPUBIC CATHETER;  Surgeon: [name], MD;  [facility];  Service: Urology;  Laterality: N/A;  PROSTATE BIOPSY     RADIOACTIVE SEED IMPLANT N/A 05/08/2014   Procedure: RADIOACTIVE SEED IMPLANT, cystoscopy;  Surgeon: [name], MD;  Location: [facility];  Service: Urology;  Laterality: N/A;  dr portable    TRANSURETHRAL RESECTION OF PROSTATE N/A 09/06/2015   Procedure: TRANSURETHRAL RESECTION OF THE PROSTATE WITH GYRUS VERSUS            (TURP) ;  Surgeon: Kathie Rhodes, MD;  Location: [facility];   Service: Urology;  Laterality: N/A;   TRANSURETHRAL RESECTION OF PROSTATE N/A 01/21/2016   Procedure: TRANSURETHRAL RESECTION OF THE PROSTATE WITH GYRUS INSTRUMENTS;  Surgeon: Kathie Rhodes, MD;  Location: [place];  Service: Urology;  Laterality: N/A;   TRANSURETHRAL RESECTION OF PROSTATE N/A 05/12/2016   Procedure: Cystoscopy clot evacuation and fulgration;  Surgeon: Alexis Frock, MD;  Location: [facility];  Service: Urology;  Laterality: N/A;    Social History:  reports that he quit smoking about 58 years ago. His smoking use included cigarettes. He has never used smokeless tobacco. He reports current alcohol use of about 14.0 standard drinks per week. He reports that he does not use drugs.  Allergies  Allergen Reactions   Penicillins Anaphylaxis and Other (See Comments)    Has patient had a PCN reaction causing immediate rash, facial/tongue/throat swelling, SOB or lightheadedness with hypotension: Yes Has patient had a PCN reaction causing severe rash involving mucus membranes or skin necrosis: No Has patient had a PCN reaction that required hospitalization No Has patient had a PCN reaction occurring within the last 10 years: No If all of the above answers are "NO", then may proceed with Cephalosporin use.    Family History  Problem Relation Age of Onset   Cancer Mother        colon   Cancer Father        lung   Diabetes Brother      Prior to Admission medications   Medication Sig Start Date End Date Taking? Authorizing Provider  doxycycline (DORYX) 100 MG EC tablet Take 100 mg by mouth 2 (two) times daily.    [provider]  furosemide (LASIX) 20 MG tablet TAKE 1 TABLET BY MOUTH DAILY. 10/09/21   Landis Martins, DPM  lisinopril (PRINIVIL,ZESTRIL) 10 MG tablet Take 10 mg by mouth every morning.     [provider]  omega-3 acid ethyl esters (LOVAZA) 1 g capsule Take 1 g by mouth daily.    [provider]  predniSONE (STERAPRED UNI-PAK 21  TAB) 10 MG (21) TBPK tablet Take as directed 04/12/21   Landis Martins, DPM    Physical Exam: Vitals:   10/24/21 1845 10/24/21 1900 10/24/21 1931 10/24/21 1946  BP: 134/63 (!) 130/57 138/74 (!) 142/74  Pulse: 100 100 93 97  Resp: (!) 21 (!) 21 18 18   Temp:   98.3 F (36.8 C) 98.2 F (36.8 C)  TempSrc:   Oral Oral  SpO2: 99% 99%  100%   Constitutional: Resting supine in bed, NAD, calm, comfortable Eyes: PERRL, lids and conjunctivae normal ENMT: Mucous membranes are moist. Posterior pharynx clear of any exudate or lesions.Normal dentition.  Neck: normal, supple, no masses. Respiratory: clear to auscultation bilaterally, no wheezing, no crackles. Normal respiratory effort. No accessory muscle use.  Cardiovascular: Regular rate and rhythm, no murmurs / rubs / gallops.  +2 right lower extremity edema and +1 left lower extremity edema. 2+ pedal pulses. Abdomen: Distended abdomen, nontender to palpation, bowel sounds positive.  Musculoskeletal: no clubbing / cyanosis. No joint deformity upper and lower extremities. Good ROM, no contractures. Normal muscle tone.  Skin: no rashes, lesions, ulcers. No induration Neurologic: CN 2-12 grossly intact. Sensation intact. Strength 5/5 in all 4.  Psychiatric: Normal judgment and insight. Alert and oriented x 3. Normal mood.   Labs on Admission: I have personally reviewed following labs and imaging studies  CBC: Recent Labs  Lab 10/24/21 1257  WBC 4.4  NEUTROABS 2.8  HGB 6.3*  HCT 21.6*  MCV 86.7  PLT 976*   Basic Metabolic Panel: Recent Labs  Lab 10/24/21 1257  NA 139  K 4.1  CL 107  CO2 22  GLUCOSE 229*  BUN 18  CREATININE 1.53*  CALCIUM 9.2   GFR: CrCl cannot be calculated (Unknown ideal weight.). Liver Function Tests: Recent Labs  Lab 10/24/21 1417  AST 38  ALT 20  ALKPHOS 68  BILITOT 0.8  PROT 7.0  ALBUMIN 2.6*   No results for input(s): LIPASE, AMYLASE in the last 168 hours. No results for input(s): AMMONIA in  the last 168 hours. Coagulation Profile: No results for input(s): INR, PROTIME in the last 168 hours. Cardiac Enzymes: No results for input(s): CKTOTAL, CKMB, CKMBINDEX, TROPONINI in the last 168 hours. BNP (last 3 results) No results for input(s): PROBNP in the last 8760 hours. HbA1C: No results for input(s): HGBA1C in the last 72 hours. CBG: No results for input(s): GLUCAP in the last 168 hours. Lipid Profile: No results for input(s): CHOL, HDL, LDLCALC, TRIG, CHOLHDL, LDLDIRECT in the last 72 hours. Thyroid Function Tests: No results for input(s): TSH, T4TOTAL, FREET4, T3FREE, THYROIDAB in the last 72 hours. Anemia Panel: No results for input(s): VITAMINB12, FOLATE, FERRITIN, TIBC, IRON, RETICCTPCT in the last 72 hours. Urine analysis:    Component Value Date/Time   COLORURINE YELLOW 05/16/2016 2359   APPEARANCEUR CLOUDY (A) 05/16/2016 2359   LABSPEC 1.011 05/16/2016 2359   PHURINE 5.5 05/16/2016 2359   GLUCOSEU NEGATIVE 05/16/2016 2359   HGBUR LARGE (A) 05/16/2016 2359   BILIRUBINUR NEGATIVE 05/16/2016 2359   KETONESUR NEGATIVE 05/16/2016 2359   PROTEINUR 30 (A) 05/16/2016 2359   NITRITE NEGATIVE 05/16/2016 2359   LEUKOCYTESUR SMALL (A) 05/16/2016 2359    Radiological Exams on Admission: DG CHEST PORT 1 VIEW  Result Date: 10/24/2021 CLINICAL DATA:  Dyspnea and abdominal distension. EXAM: PORTABLE CHEST 1 VIEW COMPARISON:  02/25/2015 FINDINGS: Patient is rotated. The heart is normal in size. Normal mediastinal contours for technique. Minor left lung base atelectasis. No confluent airspace disease. No pulmonary edema, pleural effusion, or pneumothorax. No acute osseous abnormality. IMPRESSION: Minor left lung base atelectasis. Electronically Signed   By: Keith Rake M.D.   On: 10/24/2021 20:14    EKG: Ordered and pending.  Assessment/Plan Principal Problem:   Symptomatic anemia Active Problems:   Exertional chest pain   Abdominal distension   AKI (acute kidney  injury) ([place])   Thrombocytopenia (H[facility])   HTN (hypertension)   WILL HEINKEL is a 81 y.o. male with medical history significant for prostate cancer s/p radiation seeding, HTN, PVD who is admitted with symptomatic anemia.  * Symptomatic anemia- (present on admission) Although FOBT negative patient describes episodes of melena about 1 week prior to admit.  He does take Advil and low-dose aspirin daily.  Hemoglobin 6.3 on admission. -Transfusing 1 unit PRBC -Check anemia panel -May benefit from GI evaluation in the morning -Hold aspirin, NSAIDs, pharmacologic VTE prophylaxis -Continue IV Protonix 40 mg twice daily  Exertional chest pain- (present on admission) Patient reports recent exertional chest pain described as sharp and pressure-like sensation across his chest after activity such as taking out the trash.  Denies any active chest pain. -Obtain CXR, EKG, and echocardiogram  Abdominal distension- (present on admission) Lower extremity edema, right greater than left As distended nontender abdomen on admission with right greater than left lower extremity edema.  He reports a negative RLE ultrasound DVT study as an outpatient recently.  LFTs within normal limits, BNP mildly elevated. -Obtain abdominal ultrasound -Echocardiogram as above  AKI (acute kidney injury) ([place])- (present on admission) Presumed mild AKI in setting of symptomatic anemia.  Creatinine 1.53 on admission, last creatinine 1.06 in 2017.  Continue with 1 unit PRBC transfusion, hold lisinopril, and repeat labs in a.m.  Thrombocytopenia ([place])- (present on admission) Mild, currently no indication for transfusion.  Continue to monitor.  HTN (hypertension)- (present on admission) Blood pressure currently stable.  Holding lisinopril as above.  DVT prophylaxis: SCDs Code Status: DNR, patient initially wanted to discuss with family but has now confirmed CODE STATUS to be DNR at time of admission Family Communication: Discussed  with patient's daughter at bedside Disposition Plan: From home, dispo pending clinical progress Consults called: None Level of care: Telemetry Medical Admission status:  Status is: Observation  The patient remains OBS appropriate and will d/c before 2 midnights.  Zada Finders MD [facility]  If 7PM-7AM, please contact night-coverage [email or web address]  10/24/2021, 8:22 PM

## 2021-10-24 NOTE — ED Notes (Signed)
1st unit PRBC infusing with no adverse effect , IV sites intact , afebrile/respirations unlabored/denies pain .

## 2021-10-24 NOTE — ED Notes (Signed)
Second type and screen sent to blood bank.

## 2021-10-24 NOTE — ED Triage Notes (Signed)
Routine lab work today RBC 2.61 HGB 6.9 HCT 20

## 2021-10-24 NOTE — ED Notes (Signed)
1st unity

## 2021-10-24 NOTE — Assessment & Plan Note (Signed)
Mild, currently no indication for transfusion.  Continue to monitor.

## 2021-10-24 NOTE — Assessment & Plan Note (Signed)
Blood pressure currently stable.  Holding lisinopril as above.

## 2021-10-24 NOTE — ED Notes (Signed)
Patient signed consent form for blood transfusion . 

## 2021-10-24 NOTE — ED Notes (Signed)
1st unit PRBC completed with no adverse effect , afebrile/respirations unlabored , denies pain , IV sites unremarkable , VSWNL.

## 2021-10-24 NOTE — Assessment & Plan Note (Addendum)
Lower extremity edema, right greater than left As distended nontender abdomen on admission with right greater than left lower extremity edema.  He reports a negative RLE ultrasound DVT study as an outpatient recently.  LFTs within normal limits, BNP mildly elevated. -Obtain abdominal ultrasound -Echocardiogram as above

## 2021-10-24 NOTE — Assessment & Plan Note (Signed)
Patient reports recent exertional chest pain described as sharp and pressure-like sensation across his chest after activity such as taking out the trash.  Denies any active chest pain. -Obtain CXR, EKG, and echocardiogram

## 2021-10-24 NOTE — ED Provider Triage Note (Signed)
Emergency Medicine Provider Triage Evaluation Note  Dustin Ramirez , a 81 y.o. male  was evaluated in triage.  Pt complains of sent from pcp with low hgb.  He had not seen a primary care provider in multiple years however today he had lab work done and was found to be anemic.  Was sent to the emergency department at that time.  Reports dark stool around a week ago.  Ethanol use, no NSAID use.  No history of GI bleed and has never needed a blood transfusion.  Review of Systems  Positive: Dyspnea and chest pain on exertion. Negative: Blood in stool, difficulty breathing or palpitations  Physical Exam  BP (!) 143/61 (BP Location: Right Arm)    Pulse (!) 110    Temp 98.2 F (36.8 C) (Oral)    Resp 16    SpO2 99%  Gen:   Awake, no distress   Resp:  Normal effort  MSK:   Moves extremities without difficulty  Other:  Lung sounds clear, tachycardic  Medical Decision Making  Medically screening exam initiated at 2:18 PM.  Appropriate orders placed.  Dustin Ramirez was informed that the remainder of the evaluation will be completed by another provider, this initial triage assessment does not replace that evaluation, and the importance of remaining in the ED until their evaluation is complete.     Dustin Hammock, PA-C 10/24/21 1433

## 2021-10-24 NOTE — Assessment & Plan Note (Signed)
Presumed mild AKI in setting of symptomatic anemia.  Creatinine 1.53 on admission, last creatinine 1.06 in 2017.  Continue with 1 unit PRBC transfusion, hold lisinopril, and repeat labs in a.m.

## 2021-10-25 ENCOUNTER — Observation Stay (HOSPITAL_COMMUNITY): Payer: Medicare HMO

## 2021-10-25 DIAGNOSIS — Z79899 Other long term (current) drug therapy: Secondary | ICD-10-CM | POA: Diagnosis not present

## 2021-10-25 DIAGNOSIS — I10 Essential (primary) hypertension: Secondary | ICD-10-CM | POA: Diagnosis present

## 2021-10-25 DIAGNOSIS — R140 Abdominal distension (gaseous): Secondary | ICD-10-CM | POA: Diagnosis present

## 2021-10-25 DIAGNOSIS — I739 Peripheral vascular disease, unspecified: Secondary | ICD-10-CM | POA: Diagnosis present

## 2021-10-25 DIAGNOSIS — F1010 Alcohol abuse, uncomplicated: Secondary | ICD-10-CM | POA: Diagnosis present

## 2021-10-25 DIAGNOSIS — R7989 Other specified abnormal findings of blood chemistry: Secondary | ICD-10-CM | POA: Diagnosis present

## 2021-10-25 DIAGNOSIS — N179 Acute kidney failure, unspecified: Secondary | ICD-10-CM | POA: Diagnosis present

## 2021-10-25 DIAGNOSIS — Z66 Do not resuscitate: Secondary | ICD-10-CM | POA: Diagnosis present

## 2021-10-25 DIAGNOSIS — D6959 Other secondary thrombocytopenia: Secondary | ICD-10-CM | POA: Diagnosis present

## 2021-10-25 DIAGNOSIS — R079 Chest pain, unspecified: Secondary | ICD-10-CM | POA: Diagnosis not present

## 2021-10-25 DIAGNOSIS — K31819 Angiodysplasia of stomach and duodenum without bleeding: Secondary | ICD-10-CM | POA: Diagnosis present

## 2021-10-25 DIAGNOSIS — Z7982 Long term (current) use of aspirin: Secondary | ICD-10-CM | POA: Diagnosis not present

## 2021-10-25 DIAGNOSIS — K659 Peritonitis, unspecified: Secondary | ICD-10-CM | POA: Diagnosis present

## 2021-10-25 DIAGNOSIS — K7682 Hepatic encephalopathy: Secondary | ICD-10-CM | POA: Diagnosis present

## 2021-10-25 DIAGNOSIS — Z880 Allergy status to penicillin: Secondary | ICD-10-CM | POA: Diagnosis not present

## 2021-10-25 DIAGNOSIS — K3189 Other diseases of stomach and duodenum: Secondary | ICD-10-CM | POA: Diagnosis present

## 2021-10-25 DIAGNOSIS — R188 Other ascites: Secondary | ICD-10-CM | POA: Diagnosis not present

## 2021-10-25 DIAGNOSIS — I8510 Secondary esophageal varices without bleeding: Secondary | ICD-10-CM | POA: Diagnosis present

## 2021-10-25 DIAGNOSIS — Z8546 Personal history of malignant neoplasm of prostate: Secondary | ICD-10-CM | POA: Diagnosis not present

## 2021-10-25 DIAGNOSIS — K766 Portal hypertension: Secondary | ICD-10-CM | POA: Diagnosis present

## 2021-10-25 DIAGNOSIS — N400 Enlarged prostate without lower urinary tract symptoms: Secondary | ICD-10-CM | POA: Diagnosis present

## 2021-10-25 DIAGNOSIS — K760 Fatty (change of) liver, not elsewhere classified: Secondary | ICD-10-CM | POA: Diagnosis present

## 2021-10-25 DIAGNOSIS — D509 Iron deficiency anemia, unspecified: Secondary | ICD-10-CM | POA: Diagnosis present

## 2021-10-25 DIAGNOSIS — K7031 Alcoholic cirrhosis of liver with ascites: Principal | ICD-10-CM

## 2021-10-25 DIAGNOSIS — R5381 Other malaise: Secondary | ICD-10-CM | POA: Diagnosis present

## 2021-10-25 DIAGNOSIS — D649 Anemia, unspecified: Secondary | ICD-10-CM | POA: Diagnosis not present

## 2021-10-25 DIAGNOSIS — Z20822 Contact with and (suspected) exposure to covid-19: Secondary | ICD-10-CM | POA: Diagnosis present

## 2021-10-25 DIAGNOSIS — Z87891 Personal history of nicotine dependence: Secondary | ICD-10-CM | POA: Diagnosis not present

## 2021-10-25 HISTORY — PX: IR PARACENTESIS: IMG2679

## 2021-10-25 LAB — IRON AND TIBC
Iron: 280 ug/dL — ABNORMAL HIGH (ref 45–182)
Saturation Ratios: 62 % — ABNORMAL HIGH (ref 17.9–39.5)
TIBC: 452 ug/dL — ABNORMAL HIGH (ref 250–450)
UIBC: 172 ug/dL

## 2021-10-25 LAB — COMPREHENSIVE METABOLIC PANEL
ALT: 17 U/L (ref 0–44)
AST: 32 U/L (ref 15–41)
Albumin: 2.4 g/dL — ABNORMAL LOW (ref 3.5–5.0)
Alkaline Phosphatase: 61 U/L (ref 38–126)
Anion gap: 7 (ref 5–15)
BUN: 16 mg/dL (ref 8–23)
CO2: 22 mmol/L (ref 22–32)
Calcium: 9 mg/dL (ref 8.9–10.3)
Chloride: 111 mmol/L (ref 98–111)
Creatinine, Ser: 1.44 mg/dL — ABNORMAL HIGH (ref 0.61–1.24)
GFR, Estimated: 49 mL/min — ABNORMAL LOW (ref >60)
Glucose, Bld: 133 mg/dL — ABNORMAL HIGH (ref 70–99)
Potassium: 4.6 mmol/L (ref 3.5–5.1)
Sodium: 140 mmol/L (ref 135–145)
Total Bilirubin: 3.2 mg/dL — ABNORMAL HIGH (ref 0.3–1.2)
Total Protein: 6.4 g/dL — ABNORMAL LOW (ref 6.5–8.1)

## 2021-10-25 LAB — CBC
HCT: 24.5 % — ABNORMAL LOW (ref 39.0–52.0)
Hemoglobin: 7.3 g/dL — ABNORMAL LOW (ref 13.0–17.0)
MCH: 26.4 pg (ref 26.0–34.0)
MCHC: 29.8 g/dL — ABNORMAL LOW (ref 30.0–36.0)
MCV: 88.8 fL (ref 80.0–100.0)
Platelets: 93 10*3/uL — ABNORMAL LOW (ref 150–400)
RBC: 2.76 MIL/uL — ABNORMAL LOW (ref 4.22–5.81)
RDW: 15.5 % (ref 11.5–15.5)
WBC: 3.7 10*3/uL — ABNORMAL LOW (ref 4.0–10.5)
nRBC: 0 % (ref 0.0–0.2)

## 2021-10-25 LAB — FERRITIN: Ferritin: 10 ng/mL — ABNORMAL LOW (ref 24–336)

## 2021-10-25 LAB — ECHOCARDIOGRAM COMPLETE
AR max vel: 3.88 cm2
AV Area VTI: 4.33 cm2
AV Area mean vel: 3.87 cm2
AV Mean grad: 4 mmHg
AV Peak grad: 7.7 mmHg
Ao pk vel: 1.39 m/s
Area-P 1/2: 3.68 cm2
S' Lateral: 3 cm

## 2021-10-25 LAB — PREPARE RBC (CROSSMATCH)

## 2021-10-25 LAB — BODY FLUID CELL COUNT WITH DIFFERENTIAL
Eos, Fluid: 0 %
Lymphs, Fluid: 35 %
Monocyte-Macrophage-Serous Fluid: 62 % (ref 50–90)
Neutrophil Count, Fluid: 3 % (ref 0–25)
Total Nucleated Cell Count, Fluid: 66 cu mm (ref 0–1000)

## 2021-10-25 LAB — PROTEIN, PLEURAL OR PERITONEAL FLUID: Total protein, fluid: <3 g/dL

## 2021-10-25 LAB — HEMOGLOBIN AND HEMATOCRIT, BLOOD
HCT: 21.1 % — ABNORMAL LOW (ref 39.0–52.0)
Hemoglobin: 6.5 g/dL — CL (ref 13.0–17.0)

## 2021-10-25 LAB — HEPATITIS C ANTIBODY: HCV Ab: NONREACTIVE

## 2021-10-25 LAB — RETICULOCYTES
Immature Retic Fract: 21.5 % — ABNORMAL HIGH (ref 2.3–15.9)
RBC.: 2.75 MIL/uL — ABNORMAL LOW (ref 4.22–5.81)
Retic Count, Absolute: 55 10*3/uL (ref 19.0–186.0)
Retic Ct Pct: 2 % (ref 0.4–3.1)

## 2021-10-25 LAB — PROTIME-INR
INR: 1.6 — ABNORMAL HIGH (ref 0.8–1.2)
Prothrombin Time: 18.7 seconds — ABNORMAL HIGH (ref 11.4–15.2)

## 2021-10-25 LAB — HEPATITIS B SURFACE ANTIGEN: Hepatitis B Surface Ag: NONREACTIVE

## 2021-10-25 LAB — FOLATE: Folate: 13.4 ng/mL (ref >5.9)

## 2021-10-25 LAB — VITAMIN B12: Vitamin B-12: 558 pg/mL (ref 180–914)

## 2021-10-25 LAB — ALBUMIN, PLEURAL OR PERITONEAL FLUID: Albumin, Fluid: <1.5 g/dL

## 2021-10-25 LAB — HEMOGLOBIN A1C
Hgb A1c MFr Bld: 6.1 % — ABNORMAL HIGH (ref 4.8–5.6)
Mean Plasma Glucose: 128 mg/dL

## 2021-10-25 LAB — HEPATITIS B CORE ANTIBODY, TOTAL: Hep B Core Total Ab: NONREACTIVE

## 2021-10-25 LAB — MAGNESIUM: Magnesium: 1.6 mg/dL — ABNORMAL LOW (ref 1.7–2.4)

## 2021-10-25 MED ORDER — LIDOCAINE HCL (PF) 1 % IJ SOLN
INTRAMUSCULAR | Status: DC | PRN
  Administered 2021-10-25: 10 mL

## 2021-10-25 MED ORDER — SODIUM CHLORIDE 0.9 % IV SOLN
1.0000 g | INTRAVENOUS | Status: DC
  Administered 2021-10-25 – 2021-10-29 (×5): 1 g via INTRAVENOUS
  Filled 2021-10-25 (×6): qty 10

## 2021-10-25 MED ORDER — OCTREOTIDE LOAD VIA INFUSION
50.0000 ug | Freq: Once | INTRAVENOUS | Status: AC
  Administered 2021-10-25: 13:00:00 50 ug via INTRAVENOUS
  Filled 2021-10-25: qty 25

## 2021-10-25 MED ORDER — PANTOPRAZOLE INFUSION (NEW) - SIMPLE MED
8.0000 mg/h | INTRAVENOUS | Status: DC
  Administered 2021-10-25 – 2021-10-26 (×3): 8 mg/h via INTRAVENOUS
  Filled 2021-10-25 (×3): qty 80

## 2021-10-25 MED ORDER — LACTULOSE 10 GM/15ML PO SOLN
10.0000 g | Freq: Two times a day (BID) | ORAL | Status: DC
  Administered 2021-10-25 – 2021-10-27 (×4): 10 g via ORAL
  Filled 2021-10-25: qty 15
  Filled 2021-10-25: qty 30
  Filled 2021-10-25 (×2): qty 15

## 2021-10-25 MED ORDER — RIFAXIMIN 550 MG PO TABS
550.0000 mg | ORAL_TABLET | Freq: Three times a day (TID) | ORAL | Status: DC
  Administered 2021-10-25 – 2021-10-28 (×9): 550 mg via ORAL
  Filled 2021-10-25 (×12): qty 1

## 2021-10-25 MED ORDER — SODIUM CHLORIDE 0.9 % IV SOLN
50.0000 ug/h | INTRAVENOUS | Status: DC
  Administered 2021-10-25 – 2021-10-27 (×5): 50 ug/h via INTRAVENOUS
  Filled 2021-10-25 (×6): qty 1

## 2021-10-25 MED ORDER — MAGNESIUM SULFATE 2 GM/50ML IV SOLN
2.0000 g | Freq: Once | INTRAVENOUS | Status: AC
  Administered 2021-10-25: 09:00:00 2 g via INTRAVENOUS
  Filled 2021-10-25: qty 50

## 2021-10-25 MED ORDER — ALBUMIN HUMAN 25 % IV SOLN
12.5000 g | Freq: Four times a day (QID) | INTRAVENOUS | Status: AC
  Administered 2021-10-25 (×2): 12.5 g via INTRAVENOUS
  Filled 2021-10-25 (×2): qty 50

## 2021-10-25 MED ORDER — SODIUM CHLORIDE 0.9% IV SOLUTION: Freq: Once | INTRAVENOUS | Status: AC

## 2021-10-25 NOTE — H&P (View-Only) (Signed)
[facility] Consult: 10:26 AM 10/25/2021  LOS: 0 days    Referring Provider: Dr Tawanna Solo  Primary Care Physician:  Ronita Hipps, MD Primary Gastroenterologist:  Dr Phineas Real MD in [facility]    Reason for Consultation:  new dx cirrhosis.  Anemia.     HPI: Dustin Ramirez is a 81 y.o. male.  PMH alcohol abuse/dependence.  Consumes half gallon of vodka weekly.  Prostate cancer treated with rad seed implants 2015.  S/p TURP.  Urinary incontinence.  History colon polyps.  Latest colonoscopy was March 2022, colon polyp resected.  Information provided by patient.  Do not have report or pathology.  In 2018 was told he had a fatty liver and plan was to refer to a hepatologist.  However never seen by hepatologist as his wife became ill and his healthcare went on the back burner.  No previous EGD.  For several months he has had right greater than left edema in his feet.  For a couple of months he has had abdominal swelling.  10 days ago he had black stools which resolved associated with weakness and low-grade fever.  He takes 1 or 2 Aleve at night to assist with sleep, does not have arthritis or pain management issues.  Also takes a low-dose aspirin, this is not prescribed.  Seen at PCP office yesterday, lab work obtained and revealed anemia contacted and advised to go to the ED which he did yesterday morning. Hgb 6.3 .[name]. 1 PRBC 7.3.  MCV 86.  Iron elevated at 280.  TIBC and iron sats also elevated.  Ferritin decreased at 10.  Folate, B12 WNL. Platelets 93.  INR 1.6 Serum glucose 229, 133. Creatinine 1.5 ..  1.4.  Normal BUN. Glucose 229 ..  3 3.  A1c in progress. T bili 3.2.  Normal alk phos and transaminases. HCV nonreactive.  Hep B core Ab NR.  Hep B surf Ag NR.  Hep B surf Ab in process BNP 210. 2 D echo completed.    Abdominal ultrasound, complete: Cirrhotic liver.  Splenomegaly, ascites.  Gallbladder wall polyp.  Normal Dopplers.  CBD 5 mm. Paracentesis ordered.  Labs include Albumin, cell count/differential, cytology, culture, Gram stain, protein.   Initiated meds include Protonix bolus followed by 40 mg IV every 12. Daily Rocephin.  IV  magnesium.    Patient is a retired Hotel manager.  His wife passed away a little over 2 years ago.  Lives alone.  Drinks vodka on a daily basis, generally starts around 4 in the afternoon but occasionally might have a screwdriver in the morning.  Says his longest sobriety was for about a week and that was some years ago, did not suffer any acute/active alcohol withdrawal symptoms. His father died around 59 from lung cancer but had alcoholic liver disease. Mom died with colon cancer in her late 70s/early 60s.    Past Medical History:  Diagnosis Date   ED (erectile dysfunction)    History of urinary retention    Hypertension    Incomplete emptying of bladder  Prostate cancer [facility]) urologist-  dr Karsten Ro   dx 2015  s/p  radiative prostate seed implants  05-08-2014   Prostate hyperplasia with urinary obstruction    Wears contact lenses     Past Surgical History:  Procedure Laterality Date   CYSTOSCOPY N/A 08/17/2014   Procedure: CYSTOSCOPY;  Surgeon: [name], MD;  Location: [facility];  Service: Urology;  Laterality: N/A;   CYSTOSCOPY WITH RETROGRADE URETHROGRAM N/A 04/01/2018   Procedure: CYSTOSCOPY WITH RETROGRADE URETHROGRAM/ DILATION;  Surgeon: Kathie Rhodes, MD;  Location: [place];  Service: Urology;  Laterality: N/A;  ONLY NEEDS 30 MIN   INSERTION OF SUPRAPUBIC CATHETER N/A 08/17/2014   Procedure: INSERTION OF SUPRAPUBIC CATHETER;  Surgeon: [name], MD;  Location: [facility];  Service: Urology;  Laterality: N/A;   PROSTATE BIOPSY     RADIOACTIVE SEED IMPLANT N/A 05/08/2014   Procedure: RADIOACTIVE SEED  IMPLANT, cystoscopy;  Surgeon: [name], MD;  Location: [facility];  Service: Urology;  Laterality: N/A;  dr portable    TRANSURETHRAL RESECTION OF PROSTATE N/A 09/06/2015   Procedure: TRANSURETHRAL RESECTION OF THE PROSTATE WITH GYRUS VERSUS            (TURP) ;  Surgeon: Kathie Rhodes, MD;  Location: [facility];  Service: Urology;  Laterality: N/A;   TRANSURETHRAL RESECTION OF PROSTATE N/A 01/21/2016   Procedure: TRANSURETHRAL RESECTION OF THE PROSTATE WITH GYRUS INSTRUMENTS;  Surgeon: Kathie Rhodes, MD;  Location: [place];  Service: Urology;  Laterality: N/A;   TRANSURETHRAL RESECTION OF PROSTATE N/A 05/12/2016   Procedure: Cystoscopy clot evacuation and fulgration;  Surgeon: Alexis Frock, MD;  Location: [facility];  Service: Urology;  Laterality: N/A;    Prior to Admission medications   Medication Sig Start Date End Date Taking? Authorizing Provider  aspirin 81 MG EC tablet Take 81 mg by mouth daily. Swallow whole.   Yes [provider]  lisinopril (PRINIVIL,ZESTRIL) 10 MG tablet Take 10 mg by mouth every morning.    Yes [provider]  MILK THISTLE PO Take 1 capsule by mouth daily.   Yes [provider]  Naproxen Sodium 220 MG CAPS Take 220 mg by mouth at bedtime as needed (pain/sleep).   Yes [provider]  Omega-3 Fatty Acids (FISH OIL) 1000 MG CPDR Take 1,000 mg by mouth daily.   Yes [provider]  furosemide (LASIX) 20 MG tablet TAKE 1 TABLET BY MOUTH DAILY. Patient taking differently: Take 20 mg by mouth every other day. 10/09/21   Landis Martins, DPM    Scheduled Meds:  Derrill Memo ON 10/28/2021] pantoprazole  40 mg Intravenous Q12H   sodium chloride flush  3 mL Intravenous Q12H   Infusions:  cefTRIAXone (ROCEPHIN)  IV 1 g (10/25/21 1009)   PRN Meds: acetaminophen **OR** acetaminophen, ondansetron **OR** ondansetron (ZOFRAN) IV   Allergies as of 10/24/2021 - Review Complete 10/24/2021   Allergen Reaction Noted   Penicillins Anaphylaxis and Other (See Comments) 03/05/2014    Family History  Problem Relation Age of Onset   Cancer Mother        colon   Cancer Father        lung   Diabetes Brother     Social History   Socioeconomic History   Marital status: Married    Spouse name: Not on file   Number of children: Not on file   Years of education: Not on file  Highest education level: Not on file  Occupational History   Not on file  Tobacco Use   Smoking status: Former    Years: 1.00    Types: Cigarettes    Quit date: 05/02/1963    Years since quitting: 58.5   Smokeless tobacco: Never  Vaping Use   Vaping Use: Never used  Substance and Sexual Activity   Alcohol use: Yes    Alcohol/week: 14.0 standard drinks    Types: 14 Glasses of wine per week    Comment: 2 glasses wine per night   Drug use: No   Sexual activity: Never  Other Topics Concern   Not on file  Social History Narrative   Not on file   Social Determinants of Health   Financial Resource Strain: Not on file  Food Insecurity: Not on file  Transportation Needs: Not on file  Physical Activity: Not on file  Stress: Not on file  Social Connections: Not on file  Intimate Partner Violence: Not on file    REVIEW OF SYSTEMS: Constitutional: Fatigue. ENT:  No nose bleeds Pulm: No dyspnea or cough. CV: Chest pressure with exertion, resolves with rest.  No palpitations, no LE edema.  GU: Urinary incontinence. GI: See HPI.  No dysphagia.  No blood per rectum. Heme: No unusual or excessive bleeding or bruising. Transfusions: Has received 1 PRBC so far, no previous blood transfusions. Neuro: Insomnia.  No headaches, no peripheral tingling or numbness.  No dizziness.  No seizures.  No history of active alcohol withdrawal symptoms. Derm:  No itching, no rash or sores.  Endocrine:  No sweats or chills.  No polyuria or dysuria Immunization: Not queried. Travel: Not queried.   PHYSICAL  EXAM: Vital signs in last 24 hours: Vitals:   10/25/21 0930 10/25/21 0945  BP: 108/60 (!) 99/51  Pulse: 94 87  Resp: 18 14  Temp:    SpO2: 96% 95%   Wt Readings from Last 3 Encounters:  04/01/18 87.1 kg  05/12/16 85.8 kg  01/21/16 86.9 kg    General: Overall patient looks well but is slightly tremulous and his spoken response is slow though accurate. Head: No signs of head trauma.  No facial asymmetry or swelling Eyes: Conjunctiva slightly pale.  No icterus.  EOMI. Ears: No obvious hearing deficit. Nose: No congestion or discharge Mouth: Tongue is midline.  Mucosa is pink, moist, clear.  Extensive dental repair. Neck: No JVD, no masses, no thyromegaly. Lungs: Clear bilaterally.  No labored breathing or cough. Heart: RRR.  No MRG.  S1, S2 present. Abdomen: Moderately tense, nontender.  Active bowel sounds.  Protuberant.  Do not appreciate organomegaly, bruits or hernias..   Rectal: Deferred.  DRE performed by Dr. Vanita Panda in the ED with FOBT negative stool. Musc/Skeltl: No joint redness, swelling or gross deformity. Extremities: 1-2+ pitting edema on the right foot, nonpitting edema on the left foot. Neurologic: Oriented x3.  Mild asterixis.  Slight overall tremor visible in the head.  Moves all 4 limbs, strength not tested. Skin: No significant purpura or bruising.  1 telangiectasia on the upper, posterior trunk.  No rash, sores or suspicious lesions Nodes: No cervical adenopathy Psych: Calm, cooperative, flat affect.  Intake/Output from previous day: 01/23 0701 - 01/24 0700 In: 950 [Blood:950] Out: -  Intake/Output this shift: Total I/O In: 44.3 [IV Piggyback:44.3] Out: -   LAB RESULTS: Recent Labs    10/24/21 1257 10/24/21 2335 10/25/21 0455  WBC 4.4  --  3.7*  HGB 6.3* 6.5*  7.3*  HCT 21.6* 21.1* 24.5*  PLT 112*  --  93*   BMET Lab Results  Component Value Date   NA 140 10/25/2021   NA 139 10/24/2021   NA 142 04/01/2018   K 4.6 10/25/2021   K 4.1  10/24/2021   K 4.9 04/01/2018   CL 111 10/25/2021   CL 107 10/24/2021   CL 102 05/13/2016   CO2 22 10/25/2021   CO2 22 10/24/2021   CO2 28 05/13/2016   GLUCOSE 133 (H) 10/25/2021   GLUCOSE 229 (H) 10/24/2021   GLUCOSE 131 (H) 04/01/2018   BUN 16 10/25/2021   BUN 18 10/24/2021   BUN 10 05/13/2016   CREATININE 1.44 (H) 10/25/2021   CREATININE 1.53 (H) 10/24/2021   CREATININE 1.06 05/13/2016   CALCIUM 9.0 10/25/2021   CALCIUM 9.2 10/24/2021   CALCIUM 9.1 05/13/2016   LFT Recent Labs    10/24/21 1417 10/25/21 0455  PROT 7.0 6.4*  ALBUMIN 2.6* 2.4*  AST 38 32  ALT 20 17  ALKPHOS 68 61  BILITOT 0.8 3.2*  BILIDIR 0.3*  --   IBILI 0.5  --    PT/INR Lab Results  Component Value Date   INR 1.6 (H) 10/25/2021   INR 1.04 05/01/2014   Hepatitis Panel Recent Labs    10/25/21 0455  HEPBSAG NON REACTIVE  HCVAB NON REACTIVE   C-Diff No components found for: CDIFF Lipase  No results found for: LIPASE  Drugs of Abuse  No results found for: LABOPIA, COCAINSCRNUR, LABBENZ, AMPHETMU, THCU, LABBARB   RADIOLOGY STUDIES: US Abdomen Complete  Result Date: 10/24/2021 CLINICAL DATA:  Abdominal distension EXAM: ABDOMEN ULTRASOUND COMPLETE COMPARISON:  04/10/2017 FINDINGS: Gallbladder: 8 mm echogenic non mobile nonshadowing focus compatible with polyp. No visible stones. No wall thickening or sonographic Murphy sign. Common bile duct: Diameter: Normal caliber, 5 mm. Liver: Increased echotexture throughout the liver. Nodular contours. Findings compatible with cirrhosis. 8 mm cyst in the right hepatic lobe. No suspicious focal hepatic abnormality. Portal vein is patent on color Doppler imaging with normal direction of blood flow towards the liver. IVC: No abnormality visualized. Pancreas: Not well visualized due to overlying bowel gas. Spleen: Enlarged with a splenic volume of 1117 mL. Right Kidney: Length: 10.6 cm. Echogenicity within normal limits. No mass or hydronephrosis visualized.  Left Kidney: Length: 10.0 cm. Echogenicity within normal limits. No mass or hydronephrosis visualized. Abdominal aorta: No aneurysm visualized. Other findings: Ascites noted in all 4 quadrants. IMPRESSION: Changes of cirrhosis.  Associated splenomegaly and ascites. 8 mm gallbladder wall polyp. No stones or evidence of acute cholecystitis. Electronically Signed   By: Rolm Baptise M.D.   On: 10/24/2021 21:20   DG CHEST PORT 1 VIEW  Result Date: 10/24/2021 CLINICAL DATA:  Dyspnea and abdominal distension. EXAM: PORTABLE CHEST 1 VIEW COMPARISON:  02/25/2015 FINDINGS: Patient is rotated. The heart is normal in size. Normal mediastinal contours for technique. Minor left lung base atelectasis. No confluent airspace disease. No pulmonary edema, pleural effusion, or pneumothorax. No acute osseous abnormality. IMPRESSION: Minor left lung base atelectasis. Electronically Signed   By: Keith Rake M.D.   On: 10/24/2021 20:14      IMPRESSION:   New diagnosis of cirrhosis in an alcoholic with history of fatty liver.  Decompensated.  MELD-Na 12.  Child's score 11.    Waggaman anemia.  With the exception of low ferritin, iron parameters elevated.  Appropriate response to 1 PRBC.  Tarry stools about 10 days ago, resolved.  FOBT  negative by DRE yesterday.    Thrombocytopenia.     Ascites, asymmetric pedal edema.  Paracentesis with fluid studies ordered.     Moderately elevated BNP, echocardiogram completed but no report yet.  Alcohol use disorder.   Watch for signs of active withdrawal.     Asterixis on exam.     hypomagnesemia, received IV replenishment.    PLAN:        Start octreotide and PPI drip.  EGD ~ 1100 tmrw.  Clears today after paracentesis.  Give 2 doses albumin starting now.    Starting Lactulose 10 gm bid.  Multiple labs to r/O metabollic and autoimmune sources of liver disease.     Dtr is [place] Brick:  [place]  10/25/2021, 10:26 AM Phone [phone]

## 2021-10-25 NOTE — ED Notes (Signed)
2nd unit PRBC completed with  no adverse effect , afebrile/respirations unlabored , denies pain  , IV site intact .

## 2021-10-25 NOTE — Progress Notes (Signed)
Echocardiogram 2D Echocardiogram has been performed.  Arlyss Gandy 10/25/2021, 2:14 PM

## 2021-10-25 NOTE — Plan of Care (Signed)

## 2021-10-25 NOTE — Procedures (Signed)
Ultrasound-guided diagnostic and therapeutic paracentesis performed yielding 2.2 liters of straw colored fluid.  Fluid was sent to lab for analysis. No immediate complications. EBL is none.

## 2021-10-25 NOTE — Progress Notes (Signed)
PROGRESS NOTE    Dustin Ramirez  MVH:[id number] DOB: Oct 23, 1940 DOA: 10/24/2021 PCP: Ronita Hipps, MD   Chief Complain: Weakness, fatigue, dark stool  Brief Narrative: Patient is a 81 year old male with history of prostate cancer status postradiation seeding, hypertension, peripheral vascular disease who presented to the emergency room with complaints of fatigue, dark stools.  He had recent history of gastroenteritis and was having loose dark black stools, weakness, fatigue, poor appetite, bedbound for 2 days.Also reported bilateral lower extremity swelling, exertional dyspnea.  Takes Advil nightly, aspirin 81 mg.  On presentation he was tachycardic, blood pressure was stable.  Hemoglobin was 6.3, creatinine of 1.5, BNP of 210.  Patient was ordered a unit of blood transfusion.  FOBT was negative.  Started on Protonix infusion for suspicion of GI bleed.  GI consulted today  Assessment & Plan:   Principal Problem:   Symptomatic anemia Active Problems:   HTN (hypertension)   Thrombocytopenia (HCC)   AKI (acute kidney injury) (HCC)   Abdominal distension   Exertional chest pain   Symptomatic anemia: Presented with fatigue, dark stools.  Takes Advil, aspirin.  FOBT negative.  Hemoglobin was 6.3 on admission.  Transfused with 2 units of PRBC in total.  Hemoglobin of 7.3 today.  Continue monitoring  Suspected upper GI bleed: History of melena recently.  Suspicion for GI bleed, possible variceal bleeding on the background of cirrhosis.  GI consulted.  Started on ceftriaxone  Cirrhosis/ascites: History of heavy/moderate alcohol use several years ago.  Currently not taking.  Ultrasound showed cirrhosis, ascites.  Will order for paracentesis.  He will need diuretics at some point  Exertional chest pain:   Described pain as sharp, pressure-like.  Currently chest pain-free.  Echo has been ordered.  Abdominal distention/bilateral lower extremity edema: Recent right venous Doppler negative for DVT.   BNP mildly elevated.  Echocardiogram pending.  Edema likely associated with cirrhosis, low albumin Ultrasound of the abdomen showed changes of cirrhosis.,associated splenomegaly and ascites.8 mm gallbladder wall polyp. No stones or evidence of acute cholecystitis. He takes Lasix 20 mg daily at home   AKI: History of recent diarrhea, melena.  Could also be associated with anemia.  Last creatinine was 1.06 in 2017.  Kidney function is improving.  Lisinopril ,lasix on hold  Thrombocytopenia: Mild.  Continue to monitor.  Likely associated with cirrhosis, splenomegaly  Hypertension: Currently blood pressure stable.  Lisinopril on hold  Hypomagnesemia: Supplement with potassium        DVT prophylaxis:SCD Code Status: Full Family Communication: Daughter at bedside on 1/24 Patient status:Obs  Dispo: The patient is from: Home              Anticipated d/c is to: Home              Anticipated d/c date is:2-3 days   Consultants: GI  Procedures:None yet  Antimicrobials:  Anti-infectives (From admission, onward)    None       Subjective: Patient seen and examined at the bedside this morning.  Hemodynamically stable.  He feels better today.  Lying in bed, weak.  Denies any new complaints  Objective: Vitals:   10/25/21 0615 10/25/21 0630 10/25/21 0645 10/25/21 0700  BP: 126/65 120/79 139/66 135/72  Pulse: 88 91 88 90  Resp: (!) 21 19 18  (!) 27  Temp:      TempSrc:      SpO2: 96% 96% 97% 97%    Intake/Output Summary (Last 24 hours) at 10/25/2021 8413 Last data filed  at 10/25/2021 0236 Gross per 24 hour  Intake 950 ml  Output --  Net 950 ml   There were no vitals filed for this visit.  Examination:  General exam: Chronically looking, weak HEENT: PERRL Respiratory system:  no wheezes or crackles  Cardiovascular system: S1 & S2 heard, RRR.  Gastrointestinal system: Abdomen is distended, ascites Central nervous system: Alert and oriented Extremities: 2-3+ bilateral lower  extremity pitting edema, no clubbing ,no cyanosis Skin: No rashes, no ulcers,no icterus      Data Reviewed: I have personally reviewed following labs and imaging studies  CBC: Recent Labs  Lab 10/24/21 1257 10/24/21 2335 10/25/21 0455  WBC 4.4  --  3.7*  NEUTROABS 2.8  --   --   HGB 6.3* 6.5* 7.3*  HCT 21.6* 21.1* 24.5*  MCV 86.7  --  88.8  PLT 112*  --  93*   Basic Metabolic Panel: Recent Labs  Lab 10/24/21 1257 10/25/21 0455  NA 139 140  K 4.1 4.6  CL 107 111  CO2 22 22  GLUCOSE 229* 133*  BUN 18 16  CREATININE 1.53* 1.44*  CALCIUM 9.2 9.0  MG  --  1.6*   GFR: CrCl cannot be calculated (Unknown ideal weight.). Liver Function Tests: Recent Labs  Lab 10/24/21 1417 10/25/21 0455  AST 38 32  ALT 20 17  ALKPHOS 68 61  BILITOT 0.8 3.2*  PROT 7.0 6.4*  ALBUMIN 2.6* 2.4*   No results for input(s): LIPASE, AMYLASE in the last 168 hours. No results for input(s): AMMONIA in the last 168 hours. Coagulation Profile: Recent Labs  Lab 10/25/21 0455  INR 1.6*   Cardiac Enzymes: No results for input(s): CKTOTAL, CKMB, CKMBINDEX, TROPONINI in the last 168 hours. BNP (last 3 results) No results for input(s): PROBNP in the last 8760 hours. HbA1C: No results for input(s): HGBA1C in the last 72 hours. CBG: No results for input(s): GLUCAP in the last 168 hours. Lipid Profile: No results for input(s): CHOL, HDL, LDLCALC, TRIG, CHOLHDL, LDLDIRECT in the last 72 hours. Thyroid Function Tests: No results for input(s): TSH, T4TOTAL, FREET4, T3FREE, THYROIDAB in the last 72 hours. Anemia Panel: Recent Labs    10/25/21 0455  VITAMINB12 558  FOLATE 13.4  FERRITIN 10*  TIBC 452*  IRON 280*  RETICCTPCT 2.0   Sepsis Labs: No results for input(s): PROCALCITON, LATICACIDVEN in the last 168 hours.  Recent Results (from the past 240 hour(s))  Resp Panel by RT-PCR (Flu A&B, Covid) Nasopharyngeal Swab     Status: None   Collection Time: 10/24/21 12:32 PM   Specimen:  Nasopharyngeal Swab; Nasopharyngeal(NP) swabs in vial transport medium  Result Value Ref Range Status   SARS Coronavirus 2 by RT PCR NEGATIVE NEGATIVE Final    Comment: (NOTE) SARS-CoV-2 target nucleic acids are NOT DETECTED.  The SARS-CoV-2 RNA is generally detectable in upper respiratory specimens during the acute phase of infection. The lowest concentration of SARS-CoV-2 viral copies this assay can detect is 138 copies/mL. A negative result does not preclude SARS-Cov-2 infection and should not be used as the sole basis for treatment or other patient management decisions. A negative result may occur with  improper specimen collection/handling, submission of specimen other than nasopharyngeal swab, presence of viral mutation(s) within the areas targeted by this assay, and inadequate number of viral copies(<138 copies/mL). A negative result must be combined with clinical observations, patient history, and epidemiological information. The expected result is Negative.  Fact Sheet for Patients:  EntrepreneurPulse.com.au  Fact Sheet for Healthcare Providers:  IncredibleEmployment.be  This test is no t yet approved or cleared by the [place] FDA and  has been authorized for detection and/or diagnosis of SARS-CoV-2 by FDA under an Emergency Use Authorization (EUA). This EUA will remain  in effect (meaning this test can be used) for the duration of the COVID-19 declaration under Section 564(b)(1) of the Act, 21 U.S.C.section 360bbb-3(b)(1), unless the authorization is terminated  or revoked sooner.       Influenza A by PCR NEGATIVE NEGATIVE Final   Influenza B by PCR NEGATIVE NEGATIVE Final    Comment: (NOTE) The Xpert Xpress SARS-CoV-2/FLU/RSV plus assay is intended as an aid in the diagnosis of influenza from Nasopharyngeal swab specimens and should not be used as a sole basis for treatment. Nasal washings and aspirates are unacceptable for  Xpert Xpress SARS-CoV-2/FLU/RSV testing.  Fact Sheet for Patients: EntrepreneurPulse.com.au  Fact Sheet for Healthcare Providers: IncredibleEmployment.be  This test is not yet approved or cleared by the [place] FDA and has been authorized for detection and/or diagnosis of SARS-CoV-2 by FDA under an Emergency Use Authorization (EUA). This EUA will remain in effect (meaning this test can be used) for the duration of the COVID-19 declaration under Section 564(b)(1) of the Act, 21 U.S.C. section 360bbb-3(b)(1), unless the authorization is terminated or revoked.  Performed at [facility], [place] [address], [place], [place] [address]          Radiology Studies: US Abdomen Complete  Result Date: 10/24/2021 CLINICAL DATA:  Abdominal distension EXAM: ABDOMEN ULTRASOUND COMPLETE COMPARISON:  04/10/2017 FINDINGS: Gallbladder: 8 mm echogenic non mobile nonshadowing focus compatible with polyp. No visible stones. No wall thickening or sonographic Murphy sign. Common bile duct: Diameter: Normal caliber, 5 mm. Liver: Increased echotexture throughout the liver. Nodular contours. Findings compatible with cirrhosis. 8 mm cyst in the right hepatic lobe. No suspicious focal hepatic abnormality. Portal vein is patent on color Doppler imaging with normal direction of blood flow towards the liver. IVC: No abnormality visualized. Pancreas: Not well visualized due to overlying bowel gas. Spleen: Enlarged with a splenic volume of 1117 mL. Right Kidney: Length: 10.6 cm. Echogenicity within normal limits. No mass or hydronephrosis visualized. Left Kidney: Length: 10.0 cm. Echogenicity within normal limits. No mass or hydronephrosis visualized. Abdominal aorta: No aneurysm visualized. Other findings: Ascites noted in all 4 quadrants. IMPRESSION: Changes of cirrhosis.  Associated splenomegaly and ascites. 8 mm gallbladder wall polyp. No stones or evidence of acute  cholecystitis. Electronically Signed   By: Rolm Baptise M.D.   On: 10/24/2021 21:20   DG CHEST PORT 1 VIEW  Result Date: 10/24/2021 CLINICAL DATA:  Dyspnea and abdominal distension. EXAM: PORTABLE CHEST 1 VIEW COMPARISON:  02/25/2015 FINDINGS: Patient is rotated. The heart is normal in size. Normal mediastinal contours for technique. Minor left lung base atelectasis. No confluent airspace disease. No pulmonary edema, pleural effusion, or pneumothorax. No acute osseous abnormality. IMPRESSION: Minor left lung base atelectasis. Electronically Signed   By: Keith Rake M.D.   On: 10/24/2021 20:14        Scheduled Meds:  [START ON 10/28/2021] pantoprazole  40 mg Intravenous Q12H   sodium chloride flush  3 mL Intravenous Q12H   Continuous Infusions:  magnesium sulfate bolus IVPB       LOS: 0 days        Shelly Coss, MD [facility] P1/24/2023, 8:17 AM

## 2021-10-25 NOTE — ED Notes (Signed)
2nd unit PRBC infusing , afebrile , denies pain/respirations unlabored , IV sites intact .

## 2021-10-25 NOTE — Consult Note (Addendum)
[facility] Consult: 10:26 AM 10/25/2021  LOS: 0 days    Referring Provider: Dr Tawanna Solo  Primary Care Physician:  Ronita Hipps, MD Primary Gastroenterologist:  Dr Phineas Real MD in [facility]    Reason for Consultation:  new dx cirrhosis.  Anemia.     HPI: Dustin Ramirez is a 81 y.o. male.  PMH alcohol abuse/dependence.  Consumes half gallon of vodka weekly.  Prostate cancer treated with rad seed implants 2015.  S/p TURP.  Urinary incontinence.  History colon polyps.  Latest colonoscopy was March 2022, colon polyp resected.  Information provided by patient.  Do not have report or pathology.  In 2018 was told he had a fatty liver and plan was to refer to a hepatologist.  However never seen by hepatologist as his wife became ill and his healthcare went on the back burner.  No previous EGD.  For several months he has had right greater than left edema in his feet.  For a couple of months he has had abdominal swelling.  10 days ago he had black stools which resolved associated with weakness and low-grade fever.  He takes 1 or 2 Aleve at night to assist with sleep, does not have arthritis or pain management issues.  Also takes a low-dose aspirin, this is not prescribed.  Seen at PCP office yesterday, lab work obtained and revealed anemia contacted and advised to go to the ED which he did yesterday morning. Hgb 6.3 .[name]. 1 PRBC 7.3.  MCV 86.  Iron elevated at 280.  TIBC and iron sats also elevated.  Ferritin decreased at 10.  Folate, B12 WNL. Platelets 93.  INR 1.6 Serum glucose 229, 133. Creatinine 1.5 ..  1.4.  Normal BUN. Glucose 229 ..  3 3.  A1c in progress. T bili 3.2.  Normal alk phos and transaminases. HCV nonreactive.  Hep B core Ab NR.  Hep B surf Ag NR.  Hep B surf Ab in process BNP 210. 2 D echo completed.    Abdominal ultrasound, complete: Cirrhotic liver.  Splenomegaly, ascites.  Gallbladder wall polyp.  Normal Dopplers.  CBD 5 mm. Paracentesis ordered.  Labs include Albumin, cell count/differential, cytology, culture, Gram stain, protein.   Initiated meds include Protonix bolus followed by 40 mg IV every 12. Daily Rocephin.  IV  magnesium.    Patient is a retired Hotel manager.  His wife passed away a little over 2 years ago.  Lives alone.  Drinks vodka on a daily basis, generally starts around 4 in the afternoon but occasionally might have a screwdriver in the morning.  Says his longest sobriety was for about a week and that was some years ago, did not suffer any acute/active alcohol withdrawal symptoms. His father died around 59 from lung cancer but had alcoholic liver disease. Mom died with colon cancer in her late 70s/early 70s.    Past Medical History:  Diagnosis Date   ED (erectile dysfunction)    History of urinary retention    Hypertension    Incomplete emptying of bladder  Prostate cancer [facility]) urologist-  dr Karsten Ro   dx 2015  s/p  radiative prostate seed implants  05-08-2014   Prostate hyperplasia with urinary obstruction    Wears contact lenses     Past Surgical History:  Procedure Laterality Date   CYSTOSCOPY N/A 08/17/2014   Procedure: CYSTOSCOPY;  Surgeon: [name], MD;  Location: [facility];  Service: Urology;  Laterality: N/A;   CYSTOSCOPY WITH RETROGRADE URETHROGRAM N/A 04/01/2018   Procedure: CYSTOSCOPY WITH RETROGRADE URETHROGRAM/ DILATION;  Surgeon: Kathie Rhodes, MD;  Location: [place];  Service: Urology;  Laterality: N/A;  ONLY NEEDS 30 MIN   INSERTION OF SUPRAPUBIC CATHETER N/A 08/17/2014   Procedure: INSERTION OF SUPRAPUBIC CATHETER;  Surgeon: [name], MD;  Location: [facility];  Service: Urology;  Laterality: N/A;   PROSTATE BIOPSY     RADIOACTIVE SEED IMPLANT N/A 05/08/2014   Procedure: RADIOACTIVE SEED  IMPLANT, cystoscopy;  Surgeon: [name], MD;  Location: [facility];  Service: Urology;  Laterality: N/A;  dr portable    TRANSURETHRAL RESECTION OF PROSTATE N/A 09/06/2015   Procedure: TRANSURETHRAL RESECTION OF THE PROSTATE WITH GYRUS VERSUS            (TURP) ;  Surgeon: Kathie Rhodes, MD;  Location: [facility];  Service: Urology;  Laterality: N/A;   TRANSURETHRAL RESECTION OF PROSTATE N/A 01/21/2016   Procedure: TRANSURETHRAL RESECTION OF THE PROSTATE WITH GYRUS INSTRUMENTS;  Surgeon: Kathie Rhodes, MD;  Location: [facility];  Service: Urology;  Laterality: N/A;   TRANSURETHRAL RESECTION OF PROSTATE N/A 05/12/2016   Procedure: Cystoscopy clot evacuation and fulgration;  Surgeon: Alexis Frock, MD;  Location: [facility];  Service: Urology;  Laterality: N/A;    Prior to Admission medications   Medication Sig Start Date End Date Taking? Authorizing Provider  aspirin 81 MG EC tablet Take 81 mg by mouth daily. Swallow whole.   Yes [provider]  lisinopril (PRINIVIL,ZESTRIL) 10 MG tablet Take 10 mg by mouth every morning.    Yes [provider]  MILK THISTLE PO Take 1 capsule by mouth daily.   Yes [provider]  Naproxen Sodium 220 MG CAPS Take 220 mg by mouth at bedtime as needed (pain/sleep).   Yes [provider]  Omega-3 Fatty Acids (FISH OIL) 1000 MG CPDR Take 1,000 mg by mouth daily.   Yes [provider]  furosemide (LASIX) 20 MG tablet TAKE 1 TABLET BY MOUTH DAILY. Patient taking differently: Take 20 mg by mouth every other day. 10/09/21   Landis Martins, DPM    Scheduled Meds:  Derrill Memo ON 10/28/2021] pantoprazole  40 mg Intravenous Q12H   sodium chloride flush  3 mL Intravenous Q12H   Infusions:  cefTRIAXone (ROCEPHIN)  IV 1 g (10/25/21 1009)   PRN Meds: acetaminophen **OR** acetaminophen, ondansetron **OR** ondansetron (ZOFRAN) IV   Allergies as of 10/24/2021 - Review Complete 10/24/2021   Allergen Reaction Noted   Penicillins Anaphylaxis and Other (See Comments) 03/05/2014    Family History  Problem Relation Age of Onset   Cancer Mother        colon   Cancer Father        lung   Diabetes Brother     Social History   Socioeconomic History   Marital status: Married    Spouse name: Not on file   Number of children: Not on file   Years of education: Not on file  Highest education level: Not on file  Occupational History   Not on file  Tobacco Use   Smoking status: Former    Years: 1.00    Types: Cigarettes    Quit date: 05/02/1963    Years since quitting: 58.5   Smokeless tobacco: Never  Vaping Use   Vaping Use: Never used  Substance and Sexual Activity   Alcohol use: Yes    Alcohol/week: 14.0 standard drinks    Types: 14 Glasses of wine per week    Comment: 2 glasses wine per night   Drug use: No   Sexual activity: Never  Other Topics Concern   Not on file  Social History Narrative   Not on file   Social Determinants of Health   Financial Resource Strain: Not on file  Food Insecurity: Not on file  Transportation Needs: Not on file  Physical Activity: Not on file  Stress: Not on file  Social Connections: Not on file  Intimate Partner Violence: Not on file    REVIEW OF SYSTEMS: Constitutional: Fatigue. ENT:  No nose bleeds Pulm: No dyspnea or cough. CV: Chest pressure with exertion, resolves with rest.  No palpitations, no LE edema.  GU: Urinary incontinence. GI: See HPI.  No dysphagia.  No blood per rectum. Heme: No unusual or excessive bleeding or bruising. Transfusions: Has received 1 PRBC so far, no previous blood transfusions. Neuro: Insomnia.  No headaches, no peripheral tingling or numbness.  No dizziness.  No seizures.  No history of active alcohol withdrawal symptoms. Derm:  No itching, no rash or sores.  Endocrine:  No sweats or chills.  No polyuria or dysuria Immunization: Not queried. Travel: Not queried.   PHYSICAL  EXAM: Vital signs in last 24 hours: Vitals:   10/25/21 0930 10/25/21 0945  BP: 108/60 (!) 99/51  Pulse: 94 87  Resp: 18 14  Temp:    SpO2: 96% 95%   Wt Readings from Last 3 Encounters:  04/01/18 87.1 kg  05/12/16 85.8 kg  01/21/16 86.9 kg    General: Overall patient looks well but is slightly tremulous and his spoken response is slow though accurate. Head: No signs of head trauma.  No facial asymmetry or swelling Eyes: Conjunctiva slightly pale.  No icterus.  EOMI. Ears: No obvious hearing deficit. Nose: No congestion or discharge Mouth: Tongue is midline.  Mucosa is pink, moist, clear.  Extensive dental repair. Neck: No JVD, no masses, no thyromegaly. Lungs: Clear bilaterally.  No labored breathing or cough. Heart: RRR.  No MRG.  S1, S2 present. Abdomen: Moderately tense, nontender.  Active bowel sounds.  Protuberant.  Do not appreciate organomegaly, bruits or hernias..   Rectal: Deferred.  DRE performed by Dr. Vanita Panda in the ED with FOBT negative stool. Musc/Skeltl: No joint redness, swelling or gross deformity. Extremities: 1-2+ pitting edema on the right foot, nonpitting edema on the left foot. Neurologic: Oriented x3.  Mild asterixis.  Slight overall tremor visible in the head.  Moves all 4 limbs, strength not tested. Skin: No significant purpura or bruising.  1 telangiectasia on the upper, posterior trunk.  No rash, sores or suspicious lesions Nodes: No cervical adenopathy Psych: Calm, cooperative, flat affect.  Intake/Output from previous day: 01/23 0701 - 01/24 0700 In: 950 [Blood:950] Out: -  Intake/Output this shift: Total I/O In: 44.3 [IV Piggyback:44.3] Out: -   LAB RESULTS: Recent Labs    10/24/21 1257 10/24/21 2335 10/25/21 0455  WBC 4.4  --  3.7*  HGB 6.3* 6.5*  7.3*  HCT 21.6* 21.1* 24.5*  PLT 112*  --  93*   BMET Lab Results  Component Value Date   NA 140 10/25/2021   NA 139 10/24/2021   NA 142 04/01/2018   K 4.6 10/25/2021   K 4.1  10/24/2021   K 4.9 04/01/2018   CL 111 10/25/2021   CL 107 10/24/2021   CL 102 05/13/2016   CO2 22 10/25/2021   CO2 22 10/24/2021   CO2 28 05/13/2016   GLUCOSE 133 (H) 10/25/2021   GLUCOSE 229 (H) 10/24/2021   GLUCOSE 131 (H) 04/01/2018   BUN 16 10/25/2021   BUN 18 10/24/2021   BUN 10 05/13/2016   CREATININE 1.44 (H) 10/25/2021   CREATININE 1.53 (H) 10/24/2021   CREATININE 1.06 05/13/2016   CALCIUM 9.0 10/25/2021   CALCIUM 9.2 10/24/2021   CALCIUM 9.1 05/13/2016   LFT Recent Labs    10/24/21 1417 10/25/21 0455  PROT 7.0 6.4*  ALBUMIN 2.6* 2.4*  AST 38 32  ALT 20 17  ALKPHOS 68 61  BILITOT 0.8 3.2*  BILIDIR 0.3*  --   IBILI 0.5  --    PT/INR Lab Results  Component Value Date   INR 1.6 (H) 10/25/2021   INR 1.04 05/01/2014   Hepatitis Panel Recent Labs    10/25/21 0455  HEPBSAG NON REACTIVE  HCVAB NON REACTIVE   C-Diff No components found for: CDIFF Lipase  No results found for: LIPASE  Drugs of Abuse  No results found for: LABOPIA, COCAINSCRNUR, LABBENZ, AMPHETMU, THCU, LABBARB   RADIOLOGY STUDIES: US Abdomen Complete  Result Date: 10/24/2021 CLINICAL DATA:  Abdominal distension EXAM: ABDOMEN ULTRASOUND COMPLETE COMPARISON:  04/10/2017 FINDINGS: Gallbladder: 8 mm echogenic non mobile nonshadowing focus compatible with polyp. No visible stones. No wall thickening or sonographic Murphy sign. Common bile duct: Diameter: Normal caliber, 5 mm. Liver: Increased echotexture throughout the liver. Nodular contours. Findings compatible with cirrhosis. 8 mm cyst in the right hepatic lobe. No suspicious focal hepatic abnormality. Portal vein is patent on color Doppler imaging with normal direction of blood flow towards the liver. IVC: No abnormality visualized. Pancreas: Not well visualized due to overlying bowel gas. Spleen: Enlarged with a splenic volume of 1117 mL. Right Kidney: Length: 10.6 cm. Echogenicity within normal limits. No mass or hydronephrosis visualized.  Left Kidney: Length: 10.0 cm. Echogenicity within normal limits. No mass or hydronephrosis visualized. Abdominal aorta: No aneurysm visualized. Other findings: Ascites noted in all 4 quadrants. IMPRESSION: Changes of cirrhosis.  Associated splenomegaly and ascites. 8 mm gallbladder wall polyp. No stones or evidence of acute cholecystitis. Electronically Signed   By: Rolm Baptise M.D.   On: 10/24/2021 21:20   DG CHEST PORT 1 VIEW  Result Date: 10/24/2021 CLINICAL DATA:  Dyspnea and abdominal distension. EXAM: PORTABLE CHEST 1 VIEW COMPARISON:  02/25/2015 FINDINGS: Patient is rotated. The heart is normal in size. Normal mediastinal contours for technique. Minor left lung base atelectasis. No confluent airspace disease. No pulmonary edema, pleural effusion, or pneumothorax. No acute osseous abnormality. IMPRESSION: Minor left lung base atelectasis. Electronically Signed   By: Keith Rake M.D.   On: 10/24/2021 20:14      IMPRESSION:   New diagnosis of cirrhosis in an alcoholic with history of fatty liver.  Decompensated.  MELD-Na 12.  Child's score 11.    [place] anemia.  With the exception of low ferritin, iron parameters elevated.  Appropriate response to 1 PRBC.  Tarry stools about 10 days ago, resolved.  FOBT  negative by DRE yesterday.    Thrombocytopenia.     Ascites, asymmetric pedal edema.  Paracentesis with fluid studies ordered.     Moderately elevated BNP, echocardiogram completed but no report yet.  Alcohol use disorder.   Watch for signs of active withdrawal.     Asterixis on exam.     hypomagnesemia, received IV replenishment.    PLAN:        Start octreotide and PPI drip.  EGD ~ 1100 tmrw.  Clears today after paracentesis.  Give 2 doses albumin starting now.    Starting Lactulose 10 gm bid.  Multiple labs to r/O metabollic and autoimmune sources of liver disease.     Dtr is [place] Brick:  [place]  10/25/2021, 10:26 AM Phone [phone]

## 2021-10-25 NOTE — ED Notes (Signed)
2nd unit PRBC infusing with no adverse effect , afebrile /VSWNL , denies pain , respirations unlabored , IV sites unremarkable.

## 2021-10-26 ENCOUNTER — Encounter (HOSPITAL_COMMUNITY): Payer: Self-pay | Admitting: Internal Medicine

## 2021-10-26 ENCOUNTER — Inpatient Hospital Stay (HOSPITAL_COMMUNITY): Payer: Medicare HMO | Admitting: Anesthesiology

## 2021-10-26 ENCOUNTER — Encounter (HOSPITAL_COMMUNITY)
Admission: EM | Disposition: A | Discharge status: Skilled Nursing Facility | Payer: Self-pay | Source: Home / Self Care | Attending: Internal Medicine

## 2021-10-26 DIAGNOSIS — K3189 Other diseases of stomach and duodenum: Secondary | ICD-10-CM

## 2021-10-26 DIAGNOSIS — I8500 Esophageal varices without bleeding: Secondary | ICD-10-CM

## 2021-10-26 DIAGNOSIS — K766 Portal hypertension: Secondary | ICD-10-CM

## 2021-10-26 DIAGNOSIS — K2990 Gastroduodenitis, unspecified, without bleeding: Secondary | ICD-10-CM

## 2021-10-26 DIAGNOSIS — I8510 Secondary esophageal varices without bleeding: Secondary | ICD-10-CM

## 2021-10-26 DIAGNOSIS — D649 Anemia, unspecified: Secondary | ICD-10-CM | POA: Diagnosis not present

## 2021-10-26 HISTORY — PX: ESOPHAGOGASTRODUODENOSCOPY (EGD) WITH PROPOFOL: SHX5813

## 2021-10-26 HISTORY — PX: BIOPSY: SHX5522

## 2021-10-26 LAB — CBC WITH DIFFERENTIAL/PLATELET
Abs Immature Granulocytes: 0.02 10*3/uL (ref 0.00–0.07)
Basophils Absolute: 0 10*3/uL (ref 0.0–0.1)
Basophils Relative: 1 %
Eosinophils Absolute: 0.1 10*3/uL (ref 0.0–0.5)
Eosinophils Relative: 2 %
HCT: 22.8 % — ABNORMAL LOW (ref 39.0–52.0)
Hemoglobin: 7.1 g/dL — ABNORMAL LOW (ref 13.0–17.0)
Immature Granulocytes: 1 %
Lymphocytes Relative: 27 %
Lymphs Abs: 1 10*3/uL (ref 0.7–4.0)
MCH: 26.5 pg (ref 26.0–34.0)
MCHC: 31.1 g/dL (ref 30.0–36.0)
MCV: 85.1 fL (ref 80.0–100.0)
Monocytes Absolute: 0.4 10*3/uL (ref 0.1–1.0)
Monocytes Relative: 10 %
Neutro Abs: 2.3 10*3/uL (ref 1.7–7.7)
Neutrophils Relative %: 59 %
Platelets: 89 10*3/uL — ABNORMAL LOW (ref 150–400)
RBC: 2.68 MIL/uL — ABNORMAL LOW (ref 4.22–5.81)
RDW: 15.5 % (ref 11.5–15.5)
WBC: 3.7 10*3/uL — ABNORMAL LOW (ref 4.0–10.5)
nRBC: 0 % (ref 0.0–0.2)

## 2021-10-26 LAB — BASIC METABOLIC PANEL
Anion gap: 8 (ref 5–15)
BUN: 16 mg/dL (ref 8–23)
CO2: 22 mmol/L (ref 22–32)
Calcium: 9 mg/dL (ref 8.9–10.3)
Chloride: 105 mmol/L (ref 98–111)
Creatinine, Ser: 1.57 mg/dL — ABNORMAL HIGH (ref 0.61–1.24)
GFR, Estimated: 45 mL/min — ABNORMAL LOW (ref >60)
Glucose, Bld: 152 mg/dL — ABNORMAL HIGH (ref 70–99)
Potassium: 4.4 mmol/L (ref 3.5–5.1)
Sodium: 135 mmol/L (ref 135–145)

## 2021-10-26 LAB — TYPE AND SCREEN
ABO/RH(D): O POS
Antibody Screen: NEGATIVE
Unit division: 0
Unit division: 0

## 2021-10-26 LAB — BPAM RBC
Blood Product Expiration Date: 202302142359
Blood Product Expiration Date: 202302162359
ISSUE DATE / TIME: 202301231923
ISSUE DATE / TIME: 202301240043
Unit Type and Rh: 5100
Unit Type and Rh: 5100

## 2021-10-26 LAB — GRAM STAIN

## 2021-10-26 LAB — MAGNESIUM: Magnesium: 1.7 mg/dL (ref 1.7–2.4)

## 2021-10-26 LAB — HEPATITIS B SURFACE ANTIBODY, QUANTITATIVE: Hep B S AB Quant (Post): <3.1 m[IU]/mL — ABNORMAL LOW (ref >9.9)

## 2021-10-26 LAB — CYTOLOGY - NON PAP

## 2021-10-26 SURGERY — ESOPHAGOGASTRODUODENOSCOPY (EGD) WITH PROPOFOL
Anesthesia: Monitor Anesthesia Care

## 2021-10-26 MED ORDER — THIAMINE HCL 100 MG/ML IJ SOLN
100.0000 mg | Freq: Every day | INTRAMUSCULAR | Status: DC
  Filled 2021-10-26 (×2): qty 2

## 2021-10-26 MED ORDER — PANTOPRAZOLE SODIUM 40 MG PO TBEC
40.0000 mg | DELAYED_RELEASE_TABLET | Freq: Every day | ORAL | Status: DC
  Administered 2021-10-26 – 2021-10-29 (×4): 40 mg via ORAL
  Filled 2021-10-26 (×5): qty 1

## 2021-10-26 MED ORDER — ADULT MULTIVITAMIN W/MINERALS CH
1.0000 | ORAL_TABLET | Freq: Every day | ORAL | Status: DC
  Administered 2021-10-27 – 2021-10-29 (×3): 1 via ORAL
  Filled 2021-10-26 (×4): qty 1

## 2021-10-26 MED ORDER — FOLIC ACID 1 MG PO TABS
1.0000 mg | ORAL_TABLET | Freq: Every day | ORAL | Status: DC
  Administered 2021-10-26 – 2021-10-29 (×4): 1 mg via ORAL
  Filled 2021-10-26 (×4): qty 1

## 2021-10-26 MED ORDER — MAGNESIUM OXIDE -MG SUPPLEMENT 400 (240 MG) MG PO TABS
400.0000 mg | ORAL_TABLET | Freq: Every day | ORAL | Status: DC
  Administered 2021-10-26 – 2021-10-29 (×4): 400 mg via ORAL
  Filled 2021-10-26 (×4): qty 1

## 2021-10-26 MED ORDER — NADOLOL 20 MG PO TABS
20.0000 mg | ORAL_TABLET | Freq: Every day | ORAL | Status: DC
  Administered 2021-10-26 – 2021-10-29 (×4): 20 mg via ORAL
  Filled 2021-10-26 (×4): qty 1

## 2021-10-26 MED ORDER — LORAZEPAM 2 MG/ML IJ SOLN: 1.0000 mg | INTRAMUSCULAR | Status: AC | PRN

## 2021-10-26 MED ORDER — LORAZEPAM 1 MG PO TABS: 1.0000 mg | ORAL_TABLET | ORAL | Status: AC | PRN

## 2021-10-26 MED ORDER — PROPOFOL 500 MG/50ML IV EMUL
INTRAVENOUS | Status: DC | PRN
  Administered 2021-10-26: 100 ug/kg/min via INTRAVENOUS

## 2021-10-26 MED ORDER — LIDOCAINE 2% (20 MG/ML) 5 ML SYRINGE
INTRAMUSCULAR | Status: DC | PRN
  Administered 2021-10-26: 90 mg via INTRAVENOUS

## 2021-10-26 MED ORDER — PROPOFOL 10 MG/ML IV BOLUS
INTRAVENOUS | Status: DC | PRN
  Administered 2021-10-26 (×2): 20 mg via INTRAVENOUS

## 2021-10-26 MED ORDER — LACTATED RINGERS IV SOLN: INTRAVENOUS | Status: DC | PRN

## 2021-10-26 MED ORDER — THIAMINE HCL 100 MG PO TABS
100.0000 mg | ORAL_TABLET | Freq: Every day | ORAL | Status: DC
  Administered 2021-10-26 – 2021-10-29 (×4): 100 mg via ORAL
  Filled 2021-10-26 (×4): qty 1

## 2021-10-26 SURGICAL SUPPLY — 15 items

## 2021-10-26 NOTE — Plan of Care (Signed)
Patient was received in bed this morning while making rounds. Pt stated that he had no pain at this time and was compliant with assessment. Pt was prepped for his EGD procedure at 11 am and had his daughter sign his consent due to his generalized weakness. Pt stable at this time.   Problem: Education: Goal: Knowledge of General Education information will improve Description: Including pain rating scale, medication(s)/side effects and non-pharmacologic comfort measures Outcome: Progressing   Problem: Health Behavior/Discharge Planning: Goal: Ability to manage health-related needs will improve Outcome: Progressing   Problem: Clinical Measurements: Goal: Ability to maintain clinical measurements within normal limits will improve Outcome: Progressing Goal: Will remain free from infection Outcome: Progressing Goal: Diagnostic test results will improve Outcome: Progressing Goal: Respiratory complications will improve Outcome: Progressing Goal: Cardiovascular complication will be avoided Outcome: Progressing   Problem: Activity: Goal: Risk for activity intolerance will decrease Outcome: Progressing   Problem: Nutrition: Goal: Adequate nutrition will be maintained Outcome: Progressing   Problem: Coping: Goal: Level of anxiety will decrease Outcome: Progressing   Problem: Elimination: Goal: Will not experience complications related to bowel motility Outcome: Progressing Goal: Will not experience complications related to urinary retention Outcome: Progressing   Problem: Pain Managment: Goal: General experience of comfort will improve Outcome: Progressing   Problem: Safety: Goal: Ability to remain free from injury will improve Outcome: Progressing   Problem: Skin Integrity: Goal: Risk for impaired skin integrity will decrease Outcome: Progressing

## 2021-10-26 NOTE — Anesthesia Preprocedure Evaluation (Addendum)
Anesthesia Evaluation  Patient identified by MRN, date of birth, ID band Patient awake    Reviewed: Allergy & Precautions, NPO status , Patient's Chart, lab work & pertinent test results  History of Anesthesia Complications Negative for: history of anesthetic complications  Airway Mallampati: II  TM Distance: >3 FB Neck ROM: Full    Dental  (+) Dental Advisory Given   Pulmonary former smoker,    Pulmonary exam normal        Cardiovascular hypertension, Pt. on medications Normal cardiovascular exam     Neuro/Psych negative neurological ROS  negative psych ROS   GI/Hepatic negative GI ROS, (+) Cirrhosis     substance abuse  alcohol use,   Endo/Other  negative endocrine ROS  Renal/GU negative Renal ROS    Prostate cancer     Musculoskeletal negative musculoskeletal ROS (+)   Abdominal   Peds  Hematology  (+) anemia ,  CBC      Component                Value               Date/Time                 WBC                      3.7 (L)             10/26/2021 0539           HGB                      7.1 (L)             10/26/2021 0539           HCT                      22.8 (L)            10/26/2021 0539           PLT                      89 (L)              10/26/2021 0539        INR 1.6    Anesthesia Other Findings   Reproductive/Obstetrics                            Anesthesia Physical Anesthesia Plan  ASA: 3  Anesthesia Plan: MAC   Post-op Pain Management:    Induction:   PONV Risk Score and Plan: 1 and Propofol infusion and Treatment may vary due to age or medical condition  Airway Management Planned: Natural Airway and Nasal Cannula  Additional Equipment: None  Intra-op Plan:   Post-operative Plan:   Informed Consent: I have reviewed the patients History and Physical, chart, labs and discussed the procedure including the risks, benefits and alternatives for the  proposed anesthesia with the patient or authorized representative who has indicated his/her understanding and acceptance.   Patient has DNR.  Discussed DNR with patient and Suspend DNR.     Plan Discussed with: CRNA and Anesthesiologist  Anesthesia Plan Comments:        Anesthesia Quick Evaluation

## 2021-10-26 NOTE — Interval H&P Note (Signed)
History and Physical Interval Note:  10/26/2021 10:41 AM  Dustin Ramirez  has presented today for surgery, with the diagnosis of tarry stool last week, anemia.  new dx of cirrhosis w prior hx fatty liver 2018.  alcohol use d/o.[name]  The various methods of treatment have been discussed with the patient and family. After consideration of risks, benefits and other options for treatment, the patient has consented to  Procedure(s): ESOPHAGOGASTRODUODENOSCOPY (EGD) WITH PROPOFOL (N/A) as a surgical intervention.  The patient's history has been reviewed, patient examined, no change in status, stable for surgery.  I have reviewed the patient's chart and labs.  Questions were answered to the patient's satisfaction.     Thornton Park

## 2021-10-26 NOTE — Anesthesia Procedure Notes (Signed)
Procedure Name: MAC Date/Time: 10/26/2021 10:57 AM Performed by: Amadeo Garnet, CRNA Pre-anesthesia Checklist: Patient identified, Emergency Drugs available, Suction available and Patient being monitored Patient Re-evaluated:Patient Re-evaluated prior to induction Oxygen Delivery Method: Nasal cannula Preoxygenation: Pre-oxygenation with 100% oxygen Induction Type: IV induction Placement Confirmation: positive ETCO2 Dental Injury: Teeth and Oropharynx as per pre-operative assessment

## 2021-10-26 NOTE — Op Note (Signed)
[facility] Patient Name: Dustin Ramirez Procedure Date : 10/26/2021 MRN: [id number] Attending MD: Thornton Park MD, MD Date of Birth: 20-Nov-1940 CSN: [id number] Age: 81 Admit Type: Outpatient Procedure:                Upper GI endoscopy Indications:              Unexplained iron deficiency anemia, new diagnosis                            of cirrhosis Providers:                Thornton Park MD, MD, Jeanella Cara, RN,                            [facility] Technician, Technician Referring MD:              Medicines:                Monitored Anesthesia Care Complications:            No immediate complications. Estimated Blood Loss:     Estimated blood loss was minimal. Procedure:                Pre-Anesthesia Assessment:                           - Prior to the procedure, a History and Physical                            was performed, and patient medications and                            allergies were reviewed. The patient's tolerance of                            previous anesthesia was also reviewed. The risks                            and benefits of the procedure and the sedation                            options and risks were discussed with the patient.                            All questions were answered, and informed consent                            was obtained. Prior Anticoagulants: The patient has                            taken no previous anticoagulant or antiplatelet                            agents. ASA Grade Assessment: III - A patient with  severe systemic disease. After reviewing the risks                            and benefits, the patient was deemed in                            satisfactory condition to undergo the procedure.                           After obtaining informed consent, the endoscope was                            passed under direct vision. Throughout the                             procedure, the patient's blood pressure, pulse, and                            oxygen saturations were monitored continuously. The                            [id number]-[id number] ([id number] Olympus endoscope was introduced                            through the mouth, and advanced to the third part                            of duodenum. The upper GI endoscopy was                            accomplished without difficulty. The patient                            tolerated the procedure well. Scope In: Scope Out: Findings:      Three columns of grade I varices were found in the lower third of the       esophagus. There was no evidence for recent bleeding. No blood present.      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 In the antrum, the findings are consistent with punctate       GAVE. Biopsies were taken from the antrum, body and fundus with a cold       forceps for histology to exclude concurrent gastritis. Estimated blood       loss was minimal.      The cardia and gastric fundus were normal on retroflexion. No gastric       varices present.      The examined duodenum was normal. No blood present. Impression:               - Grade I esophageal varices.                           - Portal hypertensive gastropathy with punctate  GAVE. Biopsied.                           - Normal examined duodenum. Recommendation:           - Return patient to hospital ward for ongoing care.                           - Resume regular diet.                           - Continue present medications.                           - Start low-dose Nadolol. Titrate as heart rate                            will allow.                           - Await pathology results.                           - Plan outpatient serial hgb/hct with transfusion                            as indicated as slow GI blood loss may continue.                           - Prolonged  abstinence from alcohol may improve the                            portal hypertensive over time.                           - Outpatient follow-up with Dr. Melina Copa, his                            gastroenterologist in [place], for close monitoring                            on discharge. Procedure Code(s):        --- Professional ---                           [phone], Esophagogastroduodenoscopy, flexible,                            transoral; with biopsy, single or multiple Diagnosis Code(s):        --- Professional ---                           I85.00, Esophageal varices without bleeding                           K76.6, Portal hypertension  K31.89, Other diseases of stomach and duodenum                           D50.9, Iron deficiency anemia, unspecified CPT copyright 2019 [employer]. All rights reserved. The codes documented in this report are preliminary and upon coder review may  be revised to meet current compliance requirements. Thornton Park MD, MD 10/26/2021 11:31:03 AM This report has been signed electronically. Number of Addenda: 0

## 2021-10-26 NOTE — Progress Notes (Signed)
PROGRESS NOTE    Dustin Ramirez  EHO:[id number] DOB: 05-Apr-1941 DOA: 10/24/2021 PCP: Ronita Hipps, MD   Chief Complain: Weakness, fatigue, dark stool  Brief Narrative: Patient is a 81 year old male with history of prostate cancer status postradiation seeding, hypertension, peripheral vascular disease who presented to the emergency room with complaints of fatigue, dark stools.  He had recent history of gastroenteritis and was having loose dark black stools, weakness, fatigue, poor appetite, bedbound for 2 days.Also reported bilateral lower extremity swelling, exertional dyspnea.  Takes Advil nightly, aspirin 81 mg.  On presentation he was tachycardic, blood pressure was stable.  Hemoglobin was 6.3, creatinine of 1.5, BNP of 210.  Patient was ordered a unit of blood transfusion.  FOBT was negative.  Started on Protonix infusion for suspicion of GI bleed.  GI consulted ,underwent EGD today.  Assessment & Plan:   Principal Problem:   Symptomatic anemia Active Problems:   HTN (hypertension)   Thrombocytopenia (HCC)   AKI (acute kidney injury) (HCC)   Abdominal distension   Exertional chest pain   Symptomatic anemia: Presented with fatigue, dark stools.  Takes Advil, aspirin.  FOBT negative.  Hemoglobin was 6.3 on admission.  Transfused with 2 units of PRBC in total.  Hemoglobin of 7.1 today.  Continue monitoring  Suspected upper GI bleed: History of melena recently.  Suspicion for GI bleed, possible variceal bleeding on the background of cirrhosis.  GI consulted.  Started on ceftriaxone, octreotide.  Underwent EGD today with finding  non-bleeding esophageal varices and portal hypertensive gastropathy with punctate GAVE.   Started on nadolol for portal hypertension  Cirrhosis/ascites: History of heavy/moderate alcohol .still drinking.Ultrasound showed cirrhosis, ascites.   He will need diuretics at some point.Underwent paracentesis with removal of 2.2 L of fluid.  No evidence of SBP,  transudative in nature.   Exertional chest pain:   Described pain as sharp, pressure-like.  Currently chest pain-free.  Echo showed EF of 55 to 60%, no regional wall motion abnormality  Abdominal distention/bilateral lower extremity edema: Recent right venous Doppler negative for DVT.  BNP mildly elevated.  Echocardiogram as above. Edema likely associated with cirrhosis, low albumin Ultrasound of the abdomen showed changes of cirrhosis.,associated splenomegaly and ascites.8 mm gallbladder wall polyp. No stones or evidence of acute cholecystitis. He takes Lasix 20 mg daily at home  Chronic alcohol abuse: Still drinking.  Drinks work-up.  Noted to be anxious today.  Started on CIWA protocol.  Motivated to stop but not interested in alcohol rehabilitation   AKI: History of recent diarrhea, melena.    Last creatinine was 1.06 in 2017.  Creatinine pleatued at 1.5.  This could be his new baseline  Thrombocytopenia: Mild.  Continue to monitor.  Likely associated with cirrhosis, splenomegaly  Hypertension: Currently blood pressure stable.  Lisinopril on hold  Debility/deconditioning: PT/OT consulted        DVT prophylaxis:SCD Code Status: Full Family Communication: Daughter at bedside on 1/24 Patient status:Inpatient  Dispo: The patient is from: Home              Anticipated d/c is to: Home              Anticipated d/c date is:2-3 days   Consultants: GI  Procedures:EGD  Antimicrobials:  Anti-infectives (From admission, onward)    Start     Dose/Rate Route Frequency Ordered Stop   10/25/21 1600  rifaximin (XIFAXAN) tablet 550 mg        550 mg Oral 3 times daily 10/25/21  1540     10/25/21 0915  cefTRIAXone (ROCEPHIN) 1 g in sodium chloride 0.9 % 100 mL IVPB        1 g 200 mL/hr over 30 Minutes Intravenous Every 24 hours 10/25/21 0902         Subjective: Patient seen and examined at the bedside this morning.  Hemodynamically stable.  Looked anxious, emotional today.  Daughter at  the bedside.  Denies new complaints  Objective: Vitals:   10/25/21 1800 10/25/21 1839 10/25/21 2009 10/26/21 0509  BP: (!) 145/69 (!) 146/66 (!) 121/59 (!) 105/42  Pulse: (!) 107 (!) 101 91 90  Resp: (!) 22 17 17 16   Temp:  98.4 F (36.9 C) 98.6 F (37 C)   TempSrc:  Oral    SpO2: 100% 95% 96% 94%  Weight:    87.6 kg    Intake/Output Summary (Last 24 hours) at 10/26/2021 5397 Last data filed at 10/25/2021 2145 Gross per 24 hour  Intake 140.62 ml  Output 650 ml  Net -509.38 ml   Filed Weights   10/26/21 0509  Weight: 87.6 kg    Examination:   General exam: Not in distress, chronically ill looking, deconditioned HEENT: PERRL Respiratory system:  no wheezes or crackles  Cardiovascular system: S1 & S2 heard, RRR.  Gastrointestinal system: Abdomen is distended, soft and nontender. Central nervous system: Alert and oriented Extremities: Trace bilateral lower extremity edema, no clubbing ,no cyanosis Skin: No rashes, no ulcers,no icterus    Data Reviewed: I have personally reviewed following labs and imaging studies  CBC: Recent Labs  Lab 10/24/21 1257 10/24/21 2335 10/25/21 0455 10/26/21 0539  WBC 4.4  --  3.7* 3.7*  NEUTROABS 2.8  --   --  2.3  HGB 6.3* 6.5* 7.3* 7.1*  HCT 21.6* 21.1* 24.5* 22.8*  MCV 86.7  --  88.8 85.1  PLT 112*  --  93* 89*   Basic Metabolic Panel: Recent Labs  Lab 10/24/21 1257 10/25/21 0455 10/26/21 0539  NA 139 140 135  K 4.1 4.6 4.4  CL 107 111 105  CO2 22 22 22   GLUCOSE 229* 133* 152*  BUN 18 16 16   CREATININE 1.53* 1.44* 1.57*  CALCIUM 9.2 9.0 9.0  MG  --  1.6* 1.7   GFR: CrCl cannot be calculated (Unknown ideal weight.). Liver Function Tests: Recent Labs  Lab 10/24/21 1417 10/25/21 0455  AST 38 32  ALT 20 17  ALKPHOS 68 61  BILITOT 0.8 3.2*  PROT 7.0 6.4*  ALBUMIN 2.6* 2.4*   No results for input(s): LIPASE, AMYLASE in the last 168 hours. No results for input(s): AMMONIA in the last 168 hours. Coagulation  Profile: Recent Labs  Lab 10/25/21 0455  INR 1.6*   Cardiac Enzymes: No results for input(s): CKTOTAL, CKMB, CKMBINDEX, TROPONINI in the last 168 hours. BNP (last 3 results) No results for input(s): PROBNP in the last 8760 hours. HbA1C: Recent Labs    10/25/21 0455  HGBA1C 6.1*   CBG: No results for input(s): GLUCAP in the last 168 hours. Lipid Profile: No results for input(s): CHOL, HDL, LDLCALC, TRIG, CHOLHDL, LDLDIRECT in the last 72 hours. Thyroid Function Tests: No results for input(s): TSH, T4TOTAL, FREET4, T3FREE, THYROIDAB in the last 72 hours. Anemia Panel: Recent Labs    10/25/21 0455  VITAMINB12 558  FOLATE 13.4  FERRITIN 10*  TIBC 452*  IRON 280*  RETICCTPCT 2.0   Sepsis Labs: No results for input(s): PROCALCITON, LATICACIDVEN in the last 168 hours.  Recent Results (from the past 240 hour(s))  Resp Panel by RT-PCR (Flu A&B, Covid) Nasopharyngeal Swab     Status: None   Collection Time: 10/24/21 12:32 PM   Specimen: Nasopharyngeal Swab; Nasopharyngeal(NP) swabs in vial transport medium  Result Value Ref Range Status   SARS Coronavirus 2 by RT PCR NEGATIVE NEGATIVE Final    Comment: (NOTE) SARS-CoV-2 target nucleic acids are NOT DETECTED.  The SARS-CoV-2 RNA is generally detectable in upper respiratory specimens during the acute phase of infection. The lowest concentration of SARS-CoV-2 viral copies this assay can detect is 138 copies/mL. A negative result does not preclude SARS-Cov-2 infection and should not be used as the sole basis for treatment or other patient management decisions. A negative result may occur with  improper specimen collection/handling, submission of specimen other than nasopharyngeal swab, presence of viral mutation(s) within the areas targeted by this assay, and inadequate number of viral copies(<138 copies/mL). A negative result must be combined with clinical observations, patient history, and epidemiological information. The  expected result is Negative.  Fact Sheet for Patients:  EntrepreneurPulse.com.au  Fact Sheet for Healthcare Providers:  IncredibleEmployment.be  This test is no t yet approved or cleared by the [place] FDA and  has been authorized for detection and/or diagnosis of SARS-CoV-2 by FDA under an Emergency Use Authorization (EUA). This EUA will remain  in effect (meaning this test can be used) for the duration of the COVID-19 declaration under Section 564(b)(1) of the Act, 21 U.S.C.section 360bbb-3(b)(1), unless the authorization is terminated  or revoked sooner.       Influenza A by PCR NEGATIVE NEGATIVE Final   Influenza B by PCR NEGATIVE NEGATIVE Final    Comment: (NOTE) The Xpert Xpress SARS-CoV-2/FLU/RSV plus assay is intended as an aid in the diagnosis of influenza from Nasopharyngeal swab specimens and should not be used as a sole basis for treatment. Nasal washings and aspirates are unacceptable for Xpert Xpress SARS-CoV-2/FLU/RSV testing.  Fact Sheet for Patients: EntrepreneurPulse.com.au  Fact Sheet for Healthcare Providers: IncredibleEmployment.be  This test is not yet approved or cleared by the [place] FDA and has been authorized for detection and/or diagnosis of SARS-CoV-2 by FDA under an Emergency Use Authorization (EUA). This EUA will remain in effect (meaning this test can be used) for the duration of the COVID-19 declaration under Section 564(b)(1) of the Act, 21 U.S.C. section 360bbb-3(b)(1), unless the authorization is terminated or revoked.  Performed at [facility], [place] [address] [place], [place] [address]   Culture, body fluid w Gram Stain-bottle     Status: None (Preliminary result)   Collection Time: 10/25/21 12:46 PM   Specimen: Peritoneal Washings  Result Value Ref Range Status   Specimen Description PERITONEAL  Final   Special Requests ABDOMEN  Final   Gram  Stain   Final    WBC PRESENT,BOTH PMN AND MONONUCLEAR NO ORGANISMS SEEN CYTOSPIN SMEAR    Culture   Final    NO GROWTH < 24 HOURS Performed at [facility], [place] [address] [place], [place] [address]    Report Status PENDING  Incomplete         Radiology Studies: US Abdomen Complete  Result Date: 10/24/2021 CLINICAL DATA:  Abdominal distension EXAM: ABDOMEN ULTRASOUND COMPLETE COMPARISON:  04/10/2017 FINDINGS: Gallbladder: 8 mm echogenic non mobile nonshadowing focus compatible with polyp. No visible stones. No wall thickening or sonographic Murphy sign. Common bile duct: Diameter: Normal caliber, 5 mm. Liver: Increased echotexture throughout the  liver. Nodular contours. Findings compatible with cirrhosis. 8 mm cyst in the right hepatic lobe. No suspicious focal hepatic abnormality. Portal vein is patent on color Doppler imaging with normal direction of blood flow towards the liver. IVC: No abnormality visualized. Pancreas: Not well visualized due to overlying bowel gas. Spleen: Enlarged with a splenic volume of 1117 mL. Right Kidney: Length: 10.6 cm. Echogenicity within normal limits. No mass or hydronephrosis visualized. Left Kidney: Length: 10.0 cm. Echogenicity within normal limits. No mass or hydronephrosis visualized. Abdominal aorta: No aneurysm visualized. Other findings: Ascites noted in all 4 quadrants. IMPRESSION: Changes of cirrhosis.  Associated splenomegaly and ascites. 8 mm gallbladder wall polyp. No stones or evidence of acute cholecystitis. Electronically Signed   By: Rolm Baptise M.D.   On: 10/24/2021 21:20   DG CHEST PORT 1 VIEW  Result Date: 10/24/2021 CLINICAL DATA:  Dyspnea and abdominal distension. EXAM: PORTABLE CHEST 1 VIEW COMPARISON:  02/25/2015 FINDINGS: Patient is rotated. The heart is normal in size. Normal mediastinal contours for technique. Minor left lung base atelectasis. No confluent airspace disease. No pulmonary edema, pleural effusion, or  pneumothorax. No acute osseous abnormality. IMPRESSION: Minor left lung base atelectasis. Electronically Signed   By: Keith Rake M.D.   On: 10/24/2021 20:14   ECHOCARDIOGRAM COMPLETE  Result Date: 10/25/2021    ECHOCARDIOGRAM REPORT   Patient Name:   MASIYAH ENGEN [facility] Date of Exam: 10/25/2021 Medical Rec #:  [id number]     Height:       71.0 in Accession #:    [id number]    Weight:       192.1 lb Date of Birth:  1941/02/20     BSA:          2.073 m Patient Age:    32 years      BP:           135/72 mmHg Patient Gender: M             HR:           90 bpm. Exam Location:  Inpatient Procedure: 2D Echo Indications:    Chest pain  History:        Patient has no prior history of Echocardiogram examinations.                 Risk Factors:Hypertension.  Sonographer:    Arlyss Gandy Referring Phys: [id number] [place]  1. Left ventricular ejection fraction, by estimation, is 55 to 60%. The left ventricle has normal function. The left ventricle has no regional wall motion abnormalities. There is mild concentric left ventricular hypertrophy. Left ventricular diastolic parameters were normal.  2. Right ventricular systolic function is normal. The right ventricular size is normal. There is normal pulmonary artery systolic pressure.  3. The mitral valve is abnormal. No evidence of mitral valve regurgitation. No evidence of mitral stenosis.  4. The aortic valve was not well visualized. Aortic valve regurgitation is not visualized. No aortic stenosis is present.  5. The inferior vena cava is dilated in size with >50% respiratory variability, suggesting right atrial pressure of 8 mmHg. Comparison(s): No prior Echocardiogram. FINDINGS  Left Ventricle: Left ventricular ejection fraction, by estimation, is 55 to 60%. The left ventricle has normal function. The left ventricle has no regional wall motion abnormalities. The left ventricular internal cavity size was normal in size. There is  mild concentric left  ventricular hypertrophy. Left ventricular diastolic parameters were normal. Right Ventricle: The right ventricular size is  normal. No increase in right ventricular wall thickness. Right ventricular systolic function is normal. There is normal pulmonary artery systolic pressure. The tricuspid regurgitant velocity is 2.29 m/s, and  with an assumed right atrial pressure of 8 mmHg, the estimated right ventricular systolic pressure is 62.0 mmHg. Left Atrium: Left atrial size was normal in size. Right Atrium: Right atrial size was normal in size. Pericardium: Trivial pericardial effusion is present. Mitral Valve: Calcified echoendensity on the leaflet tips most consistent with degenerative disease. The mitral valve is abnormal. No evidence of mitral valve regurgitation. No evidence of mitral valve stenosis. Tricuspid Valve: The tricuspid valve is normal in structure. Tricuspid valve regurgitation is not demonstrated. No evidence of tricuspid stenosis. Aortic Valve: The aortic valve was not well visualized. There is mild aortic valve annular calcification. Aortic valve regurgitation is not visualized. No aortic stenosis is present. Aortic valve mean gradient measures 4.0 mmHg. Aortic valve peak gradient measures 7.7 mmHg. Aortic valve area, by VTI measures 4.33 cm. Pulmonic Valve: The pulmonic valve was not well visualized. Pulmonic valve regurgitation is not visualized. No evidence of pulmonic stenosis. Aorta: The aortic root and ascending aorta are structurally normal, with no evidence of dilitation. Venous: The inferior vena cava is dilated in size with greater than 50% respiratory variability, suggesting right atrial pressure of 8 mmHg. IAS/Shunts: No atrial level shunt detected by color flow Doppler.  LEFT VENTRICLE PLAX 2D LVIDd:         4.20 cm   Diastology LVIDs:         3.00 cm   LV e' medial:    8.59 cm/s LV PW:         1.10 cm   LV E/e' medial:  12.6 LV IVS:        1.20 cm   LV e' lateral:   12.50 cm/s LVOT  diam:     2.50 cm   LV E/e' lateral: 8.6 LV SV:         115 LV SV Index:   55 LVOT Area:     4.91 cm  RIGHT VENTRICLE             IVC RV Basal diam:  4.10 cm     IVC diam: 2.50 cm RV Mid diam:    3.10 cm RV S prime:     11.00 cm/s TAPSE (M-mode): 2.5 cm LEFT ATRIUM             Index        RIGHT ATRIUM           Index LA diam:        4.00 cm 1.93 cm/m   RA Area:     18.20 cm LA Vol (A2C):   54.0 ml 26.05 ml/m  RA Volume:   49.60 ml  23.93 ml/m LA Vol (A4C):   90.0 ml 43.42 ml/m LA Biplane Vol: 69.3 ml 33.43 ml/m  AORTIC VALVE AV Area (Vmax):    3.88 cm AV Area (Vmean):   3.87 cm AV Area (VTI):     4.33 cm AV Vmax:           139.00 cm/s AV Vmean:          97.900 cm/s AV VTI:            0.265 m AV Peak Grad:      7.7 mmHg AV Mean Grad:      4.0 mmHg LVOT Vmax:  110.00 cm/s LVOT Vmean:        77.100 cm/s LVOT VTI:          0.234 m LVOT/AV VTI ratio: 0.88  AORTA Ao Root diam: 3.50 cm Ao Asc diam:  3.40 cm MITRAL VALVE                TRICUSPID VALVE MV Area (PHT): 3.68 cm     TR Peak grad:   21.0 mmHg MV Decel Time: 206 msec     TR Vmax:        229.00 cm/s MV E velocity: 108.00 cm/s MV A velocity: 78.40 cm/s   SHUNTS MV E/A ratio:  1.38         Systemic VTI:  0.23 m                             Systemic Diam: 2.50 cm Rudean Haskell MD Electronically signed by Rudean Haskell MD Signature Date/Time: 10/25/2021/2:47:21 PM    Final    IR Paracentesis  Result Date: 10/25/2021 INDICATION: Patient with history of alcohol abuse, prostate cancer. Presented to the ED with abdominal distension. Found to have ascites. Request is for therapeutic and diagnostic paracentesis EXAM: ULTRASOUND GUIDED diagnostic and therapeutic PARACENTESIS MEDICATIONS: Lidocaine 1% 10 mL COMPLICATIONS: None immediate. PROCEDURE: Informed written consent was obtained from the patient after a discussion of the risks, benefits and alternatives to treatment. A timeout was performed prior to the initiation of the procedure.  Initial ultrasound scanning demonstrates a small amount of ascites within the left lower abdominal quadrant. The left lower abdomen was prepped and draped in the usual sterile fashion. 1% lidocaine was used for local anesthesia. Following this, a 19 gauge, 10-cm, Yueh catheter was introduced. An ultrasound image was saved for documentation purposes. The paracentesis was performed. The catheter was removed and a dressing was applied. The patient tolerated the procedure well without immediate post procedural complication. FINDINGS: A total of approximately 2.2 L of straw-colored fluid was removed. Samples were sent to the laboratory as requested by the clinical team. IMPRESSION: Successful ultrasound-guided diagnostic and therapeutic paracentesis yielding 2.2 liters of peritoneal fluid. Read by: Rushie Nyhan, NP Electronically Signed   By: Corrie Mckusick D.O.   On: 10/25/2021 15:36        Scheduled Meds:  lactulose  10 g Oral BID   [START ON 10/28/2021] pantoprazole  40 mg Intravenous Q12H   rifaximin  550 mg Oral TID   sodium chloride flush  3 mL Intravenous Q12H   Continuous Infusions:  cefTRIAXone (ROCEPHIN)  IV Stopped (10/25/21 1039)   octreotide  (SANDOSTATIN)    IV infusion 50 mcg/hr (10/25/21 2303)   pantoprazole 8 mg/hr (10/25/21 2306)     LOS: 1 day        Shelly Coss, MD [facility] P1/25/2023, 8:28 AM

## 2021-10-26 NOTE — Transfer of Care (Signed)
Immediate Anesthesia Transfer of Care Note  Patient: Dustin Ramirez  Procedure(s) Performed: ESOPHAGOGASTRODUODENOSCOPY (EGD) WITH PROPOFOL BIOPSY  Patient Location: PACU  Anesthesia Type:MAC  Level of Consciousness: awake, alert  and oriented  Airway & Oxygen Therapy: Patient Spontanous Breathing and Patient connected to nasal cannula oxygen  Post-op Assessment: Report given to RN, Post -op Vital signs reviewed and stable and Patient moving all extremities  Post vital signs: Reviewed and stable  Last Vitals:  Vitals Value Taken Time  BP 130/61 10/26/21 1125  Temp 37.1 C 10/26/21 1125  Pulse 92 10/26/21 1125  Resp 18 10/26/21 1125  SpO2 100 % 10/26/21 1125  Vitals shown include unvalidated device data.  Last Pain:  Vitals:   10/26/21 1030  TempSrc: Temporal  PainSc: 0-No pain         Complications: No notable events documented.

## 2021-10-26 NOTE — Anesthesia Postprocedure Evaluation (Signed)
Anesthesia Post Note  Patient: Dustin Ramirez  Procedure(s) Performed: ESOPHAGOGASTRODUODENOSCOPY (EGD) WITH PROPOFOL BIOPSY     Patient location during evaluation: PACU Anesthesia Type: MAC Level of consciousness: awake and alert Pain management: pain level controlled Vital Signs Assessment: post-procedure vital signs reviewed and stable Respiratory status: spontaneous breathing, nonlabored ventilation and respiratory function stable Cardiovascular status: stable and blood pressure returned to baseline Anesthetic complications: no   No notable events documented.  Last Vitals:  Vitals:   10/26/21 1140 10/26/21 1156  BP: 129/66 140/61  Pulse: 84 84  Resp: (!) 22 17  Temp: 37.1 C   SpO2: 100% 100%    Last Pain:  Vitals:   10/26/21 1140  TempSrc:   PainSc: 0-No pain                 Audry Pili

## 2021-10-26 NOTE — Progress Notes (Signed)
Initial Nutrition Assessment  DOCUMENTATION CODES:   Not applicable  INTERVENTION:  -Ensure Enlive po BID, each supplement provides 350 kcal and 20 grams of protein -MVI with minerals daily   NUTRITION DIAGNOSIS:   Increased nutrient needs related to cancer and cancer related treatments as evidenced by estimated needs.   GOAL:   Patient will meet greater than or equal to 90% of their needs   MONITOR:   PO intake, Supplement acceptance, Labs, Weight trends, I & O's  REASON FOR ASSESSMENT:   Consult Assessment of nutrition requirement/status  ASSESSMENT:   Pt with PMH significant for prostate cancer s/p radiation seeding, HTN, PVD admitted with symptomatic anemic and abdominal distention  Pt out of room at time of RD visit. Per RN, pt undergoing EGD at this time. Will attempt to obtain diet/weight history and NFPE at follow-up.   Limited recent weight history available for review; however, weight appears stable based on available readings.   No PO intake documented   UOP: 667ml x24 hours I/O: +792ml since admit  Labs reviewed. Medications: folvite, MVI w/ minerals, chronulac, protonix, thiamine, IV abx  NUTRITION - FOCUSED PHYSICAL EXAM: Unable to perform at this time. Will attempt at follow-up.   Diet Order:   Diet Order             Diet regular Room service appropriate? Yes; Fluid consistency: Thin  Diet effective now                   EDUCATION NEEDS:   No education needs have been identified at this time  Skin:  Skin Assessment: Reviewed RN Assessment  Last BM:  1/22  Height:   Ht Readings from Last 1 Encounters:  10/26/21 5\' 11"  (1.803 m)    Weight:   Wt Readings from Last 1 Encounters:  10/26/21 87.6 kg     BMI:  Body mass index is 26.94 kg/m.  Estimated Nutritional Needs:   Kcal:  2000-2200  Protein:  100-110 grams  Fluid:  >/=2L     Theone Stanley., MS, RD, LDN (she/her/hers) RD pager number and weekend/on-call pager  number located in [place].

## 2021-10-27 ENCOUNTER — Encounter (HOSPITAL_COMMUNITY): Payer: Self-pay | Admitting: Gastroenterology

## 2021-10-27 ENCOUNTER — Inpatient Hospital Stay (HOSPITAL_COMMUNITY): Payer: Medicare HMO

## 2021-10-27 DIAGNOSIS — K31819 Angiodysplasia of stomach and duodenum without bleeding: Secondary | ICD-10-CM | POA: Diagnosis not present

## 2021-10-27 DIAGNOSIS — D649 Anemia, unspecified: Secondary | ICD-10-CM | POA: Diagnosis not present

## 2021-10-27 LAB — CBC WITH DIFFERENTIAL/PLATELET
Abs Immature Granulocytes: 0 10*3/uL (ref 0.00–0.07)
Basophils Absolute: 0 10*3/uL (ref 0.0–0.1)
Basophils Relative: 1 %
Eosinophils Absolute: 0.1 10*3/uL (ref 0.0–0.5)
Eosinophils Relative: 3 %
HCT: 23.4 % — ABNORMAL LOW (ref 39.0–52.0)
Hemoglobin: 7.2 g/dL — ABNORMAL LOW (ref 13.0–17.0)
Immature Granulocytes: 0 %
Lymphocytes Relative: 23 %
Lymphs Abs: 0.8 10*3/uL (ref 0.7–4.0)
MCH: 26.4 pg (ref 26.0–34.0)
MCHC: 30.8 g/dL (ref 30.0–36.0)
MCV: 85.7 fL (ref 80.0–100.0)
Monocytes Absolute: 0.3 10*3/uL (ref 0.1–1.0)
Monocytes Relative: 10 %
Neutro Abs: 2.3 10*3/uL (ref 1.7–7.7)
Neutrophils Relative %: 63 %
Platelets: 90 10*3/uL — ABNORMAL LOW (ref 150–400)
RBC: 2.73 MIL/uL — ABNORMAL LOW (ref 4.22–5.81)
RDW: 15.5 % (ref 11.5–15.5)
WBC: 3.6 10*3/uL — ABNORMAL LOW (ref 4.0–10.5)
nRBC: 0 % (ref 0.0–0.2)

## 2021-10-27 LAB — ANTI-SMOOTH MUSCLE ANTIBODY, IGG: F-Actin IgG: 31 Units — ABNORMAL HIGH (ref 0–19)

## 2021-10-27 LAB — IGG: IgG (Immunoglobin G), Serum: 1268 mg/dL (ref 603–1613)

## 2021-10-27 LAB — BLOOD GAS, ARTERIAL
Acid-base deficit: 1.6 mmol/L (ref 0.0–2.0)
Bicarbonate: 22.7 mmol/L (ref 20.0–28.0)
Drawn by: 28099
FIO2: 32
O2 Saturation: 99.3 %
Patient temperature: 37
pCO2 arterial: 38.3 mmHg (ref 32.0–48.0)
pH, Arterial: 7.39 (ref 7.350–7.450)
pO2, Arterial: 130 mmHg — ABNORMAL HIGH (ref 83.0–108.0)

## 2021-10-27 LAB — BASIC METABOLIC PANEL
Anion gap: 5 (ref 5–15)
BUN: 15 mg/dL (ref 8–23)
CO2: 24 mmol/L (ref 22–32)
Calcium: 9 mg/dL (ref 8.9–10.3)
Chloride: 107 mmol/L (ref 98–111)
Creatinine, Ser: 1.41 mg/dL — ABNORMAL HIGH (ref 0.61–1.24)
GFR, Estimated: 51 mL/min — ABNORMAL LOW (ref >60)
Glucose, Bld: 143 mg/dL — ABNORMAL HIGH (ref 70–99)
Potassium: 4 mmol/L (ref 3.5–5.1)
Sodium: 136 mmol/L (ref 135–145)

## 2021-10-27 LAB — ALPHA-1-ANTITRYPSIN: A-1 Antitrypsin, Ser: 146 mg/dL (ref 101–187)

## 2021-10-27 LAB — IGM: IgM (Immunoglobulin M), Srm: 88 mg/dL (ref 15–143)

## 2021-10-27 LAB — MITOCHONDRIAL ANTIBODIES: Mitochondrial M2 Ab, IgG: <20 Units (ref 0.0–20.0)

## 2021-10-27 LAB — CERULOPLASMIN: Ceruloplasmin: 22.2 mg/dL (ref 16.0–31.0)

## 2021-10-27 LAB — ANA W/REFLEX IF POSITIVE: Anti Nuclear Antibody (ANA): NEGATIVE

## 2021-10-27 LAB — SURGICAL PATHOLOGY

## 2021-10-27 MED ORDER — LACTULOSE 10 GM/15ML PO SOLN
20.0000 g | Freq: Two times a day (BID) | ORAL | Status: DC
Start: 2021-10-27 — End: 2021-10-29
  Administered 2021-10-27 – 2021-10-29 (×4): 20 g via ORAL
  Filled 2021-10-27 (×4): qty 30

## 2021-10-27 MED ORDER — FUROSEMIDE 20 MG PO TABS
20.0000 mg | ORAL_TABLET | Freq: Every day | ORAL | Status: DC
  Administered 2021-10-27 – 2021-10-29 (×3): 20 mg via ORAL
  Filled 2021-10-27 (×4): qty 1

## 2021-10-27 NOTE — Progress Notes (Signed)
PROGRESS NOTE    Dustin Ramirez  QMG:[id number] DOB: 10-Jan-1941 DOA: 10/24/2021 PCP: Ronita Hipps, MD   Chief Complain: Weakness, fatigue, dark stool  Brief Narrative: Patient is a 81 year old male with history of prostate cancer status postradiation seeding, hypertension, peripheral vascular disease who presented to the emergency room with complaints of fatigue, dark stools.  He had recent history of gastroenteritis and was having loose dark black stools, weakness, fatigue, poor appetite, bedbound for 2 days.Also reported bilateral lower extremity swelling, exertional dyspnea.  Takes Advil nightly, aspirin 81 mg.  On presentation he was tachycardic, blood pressure was stable.  Hemoglobin was 6.3, creatinine of 1.5, BNP of 210.  Patient was ordered a unit of blood transfusion.  FOBT was negative.  Started on Protonix infusion for suspicion of GI bleed.  GI consulted ,underwent EGD on 10/26/21, found to have nonbleeding esophageal varices, portal hypertensive changes.  PT/OT recommending skilled nursing facility on discharge.  Medically stable for discharge as soon as bed is available.  Assessment & Plan:   Principal Problem:   Symptomatic anemia Active Problems:   HTN (hypertension)   Thrombocytopenia (HCC)   AKI (acute kidney injury) (HCC)   Abdominal distension   Exertional chest pain   Gastritis and gastroduodenitis   Portal hypertensive gastropathy (HCC)   Idiopathic esophageal varices without bleeding (HCC)   GAVE (gastric antral vascular ectasia)   Symptomatic anemia: Presented with fatigue, dark stools.  Takes Advil, aspirin.  FOBT negative.  Hemoglobin was 6.3 on admission.  Transfused with 2 units of PRBC in total.  Hemoglobin in the range of 7. Continue monitoring  Suspected upper GI bleed: History of melena recently.  Suspicion for GI bleed, possible variceal bleeding on the background of cirrhosis.  GI consulted.  Started on ceftriaxone, octreotide.  Underwent EGD  with  finding  non-bleeding esophageal varices and portal hypertensive gastropathy with punctate GAVE.   Started on nadolol for portal hypertension.  We will continue ceftriaxone for total of 5 days.  Continue lactulose, rifaximin  Cirrhosis/ascites: History of heavy/moderate alcohol .still drinking.Ultrasound showed cirrhosis, ascites.   Underwent paracentesis with removal of 2.2 L of fluid.  No evidence of SBP, transudative in nature.   Exertional chest pain:   Described pain as sharp, pressure-like.  Currently chest pain-free.  Echo showed EF of 55 to 60%, no regional wall motion abnormality  Abdominal distention/bilateral lower extremity edema: Recent right venous Doppler negative for DVT.  BNP mildly elevated.  Echocardiogram as above. Edema likely associated with cirrhosis, low albumin Ultrasound of the abdomen showed changes of cirrhosis.,associated splenomegaly and ascites.8 mm gallbladder wall polyp. No stones or evidence of acute cholecystitis. He takes Lasix 20 mg daily at home  Chronic alcohol abuse: Still drinking.  Continues to drink.  Motivated to stop but not interested in alcohol rehabilitation.  Continue thiamine folic acid   AKI: History of recent diarrhea, melena.    Last creatinine was 1.06 in 2017.  Creatinine pleatued at 1.5.  This could be his new baseline  Thrombocytopenia: Mild.  Continue to monitor.  Likely associated with cirrhosis, splenomegaly  Hypertension: Currently blood pressure stable.  Lisinopril on hold  Debility/deconditioning: PT/OT consulted Nutrition Problem: Increased nutrient needs Etiology: cancer and cancer related treatments      DVT prophylaxis:SCD Code Status: Full Family Communication: Daughter and son at bedside on 1/24 Patient status:Inpatient  Dispo: The patient is from: Home              Anticipated d/c is  to: SNF              Anticipated d/c date is: As soon as bed is available  Consultants: GI  Procedures:EGD  Antimicrobials:   Anti-infectives (From admission, onward)    Start     Dose/Rate Route Frequency Ordered Stop   10/25/21 1600  rifaximin (XIFAXAN) tablet 550 mg        550 mg Oral 3 times daily 10/25/21 1540     10/25/21 0915  cefTRIAXone (ROCEPHIN) 1 g in sodium chloride 0.9 % 100 mL IVPB        1 g 200 mL/hr over 30 Minutes Intravenous Every 24 hours 10/25/21 0902         Subjective: Patient seen and examined at the bedside this morning.  Hemodynamically stable.  Looks comfortable today.  Denies any complaints.  He is eager to go home and wants to hear the recommendation from PT/OT. family at the bedside.   Objective: Vitals:   10/26/21 1156 10/26/21 2000 10/27/21 0500 10/27/21 1138  BP: 140/61 (!) 131/54  (!) 116/54  Pulse: 84 89  70  Resp: 17 18  17   Temp:  98.2 F (36.8 C)  98.1 F (36.7 C)  TempSrc:  Oral  Oral  SpO2: 100% 97%  97%  Weight:   86.5 kg   Height:        Intake/Output Summary (Last 24 hours) at 10/27/2021 1401 Last data filed at 10/27/2021 1146 Gross per 24 hour  Intake 909.21 ml  Output 900 ml  Net 9.21 ml   Filed Weights   10/26/21 0509 10/26/21 1030 10/27/21 0500  Weight: 87.6 kg 87.6 kg 86.5 kg    Examination:   General exam: Very deconditioned, chronically ill looking, not in distress HEENT: PERRL Respiratory system:  no wheezes or crackles  Cardiovascular system: S1 & S2 heard, RRR.  Gastrointestinal system: Abdomen is distended, soft and nontender. Central nervous system: Alert and oriented Extremities: No edema, no clubbing ,no cyanosis Skin: No rashes, no ulcers,no icterus    Data Reviewed: I have personally reviewed following labs and imaging studies  CBC: Recent Labs  Lab 10/24/21 1257 10/24/21 2335 10/25/21 0455 10/26/21 0539 10/27/21 0652  WBC 4.4  --  3.7* 3.7* 3.6*  NEUTROABS 2.8  --   --  2.3 2.3  HGB 6.3* 6.5* 7.3* 7.1* 7.2*  HCT 21.6* 21.1* 24.5* 22.8* 23.4*  MCV 86.7  --  88.8 85.1 85.7  PLT 112*  --  93* 89* 90*   Basic  Metabolic Panel: Recent Labs  Lab 10/24/21 1257 10/25/21 0455 10/26/21 0539 10/27/21 0652  NA 139 140 135 136  K 4.1 4.6 4.4 4.0  CL 107 111 105 107  CO2 22 22 22 24   GLUCOSE 229* 133* 152* 143*  BUN 18 16 16 15   CREATININE 1.53* 1.44* 1.57* 1.41*  CALCIUM 9.2 9.0 9.0 9.0  MG  --  1.6* 1.7  --    GFR: Estimated Creatinine Clearance: 45.2 mL/min (A) (by C-G formula based on SCr of 1.41 mg/dL (H)). Liver Function Tests: Recent Labs  Lab 10/24/21 1417 10/25/21 0455  AST 38 32  ALT 20 17  ALKPHOS 68 61  BILITOT 0.8 3.2*  PROT 7.0 6.4*  ALBUMIN 2.6* 2.4*   No results for input(s): LIPASE, AMYLASE in the last 168 hours. No results for input(s): AMMONIA in the last 168 hours. Coagulation Profile: Recent Labs  Lab 10/25/21 0455  INR 1.6*   Cardiac Enzymes: No results  for input(s): CKTOTAL, CKMB, CKMBINDEX, TROPONINI in the last 168 hours. BNP (last 3 results) No results for input(s): PROBNP in the last 8760 hours. HbA1C: Recent Labs    10/25/21 0455  HGBA1C 6.1*   CBG: No results for input(s): GLUCAP in the last 168 hours. Lipid Profile: No results for input(s): CHOL, HDL, LDLCALC, TRIG, CHOLHDL, LDLDIRECT in the last 72 hours. Thyroid Function Tests: No results for input(s): TSH, T4TOTAL, FREET4, T3FREE, THYROIDAB in the last 72 hours. Anemia Panel: Recent Labs    10/25/21 0455  VITAMINB12 558  FOLATE 13.4  FERRITIN 10*  TIBC 452*  IRON 280*  RETICCTPCT 2.0   Sepsis Labs: No results for input(s): PROCALCITON, LATICACIDVEN in the last 168 hours.  Recent Results (from the past 240 hour(s))  Resp Panel by RT-PCR (Flu A&B, Covid) Nasopharyngeal Swab     Status: None   Collection Time: 10/24/21 12:32 PM   Specimen: Nasopharyngeal Swab; Nasopharyngeal(NP) swabs in vial transport medium  Result Value Ref Range Status   SARS Coronavirus 2 by RT PCR NEGATIVE NEGATIVE Final    Comment: (NOTE) SARS-CoV-2 target nucleic acids are NOT DETECTED.  The  SARS-CoV-2 RNA is generally detectable in upper respiratory specimens during the acute phase of infection. The lowest concentration of SARS-CoV-2 viral copies this assay can detect is 138 copies/mL. A negative result does not preclude SARS-Cov-2 infection and should not be used as the sole basis for treatment or other patient management decisions. A negative result may occur with  improper specimen collection/handling, submission of specimen other than nasopharyngeal swab, presence of viral mutation(s) within the areas targeted by this assay, and inadequate number of viral copies(<138 copies/mL). A negative result must be combined with clinical observations, patient history, and epidemiological information. The expected result is Negative.  Fact Sheet for Patients:  EntrepreneurPulse.com.au  Fact Sheet for Healthcare Providers:  IncredibleEmployment.be  This test is no t yet approved or cleared by the [place] FDA and  has been authorized for detection and/or diagnosis of SARS-CoV-2 by FDA under an Emergency Use Authorization (EUA). This EUA will remain  in effect (meaning this test can be used) for the duration of the COVID-19 declaration under Section 564(b)(1) of the Act, 21 U.S.C.section 360bbb-3(b)(1), unless the authorization is terminated  or revoked sooner.       Influenza A by PCR NEGATIVE NEGATIVE Final   Influenza B by PCR NEGATIVE NEGATIVE Final    Comment: (NOTE) The Xpert Xpress SARS-CoV-2/FLU/RSV plus assay is intended as an aid in the diagnosis of influenza from Nasopharyngeal swab specimens and should not be used as a sole basis for treatment. Nasal washings and aspirates are unacceptable for Xpert Xpress SARS-CoV-2/FLU/RSV testing.  Fact Sheet for Patients: EntrepreneurPulse.com.au  Fact Sheet for Healthcare Providers: IncredibleEmployment.be  This test is not yet approved or  cleared by the [place] FDA and has been authorized for detection and/or diagnosis of SARS-CoV-2 by FDA under an Emergency Use Authorization (EUA). This EUA will remain in effect (meaning this test can be used) for the duration of the COVID-19 declaration under Section 564(b)(1) of the Act, 21 U.S.C. section 360bbb-3(b)(1), unless the authorization is terminated or revoked.  Performed at [facility], [place] [address] [place], [place] [address]   Culture, body fluid w Gram Stain-bottle     Status: None (Preliminary result)   Collection Time: 10/25/21 12:46 PM   Specimen: Peritoneal Washings  Result Value Ref Range Status   Specimen Description PERITONEAL  Final  Special Requests ABDOMEN  Final   Culture   Final    GRAM POSITIVE RODS CRITICAL RESULT CALLED TO, READ BACK BY AND VERIFIED WITH: RN Cherly Anderson [phone] FCP Performed at [facility], [place] [address] [place], [place] [address]    Report Status PENDING  Incomplete  Gram stain     Status: None   Collection Time: 10/25/21 12:46 PM   Specimen: Peritoneal Washings  Result Value Ref Range Status   Specimen Description PERITONEAL  Final   Special Requests PERITONEAL  Final   Gram Stain   Final    WBC PRESENT, PREDOMINANTLY MONONUCLEAR NO ORGANISMS SEEN CYTOSPIN SMEAR Performed at [facility], [address], [place], [place] [address]    Report Status 10/26/2021 FINAL  Final         Radiology Studies: ECHOCARDIOGRAM COMPLETE  Result Date: 10/25/2021    ECHOCARDIOGRAM REPORT   Patient Name:   [facility] Date of Exam: 10/25/2021 Medical Rec #:  [id number]     Height:       71.0 in Accession #:    [id number]    Weight:       192.1 lb Date of Birth:  1941-08-19     BSA:          2.073 m Patient Age:    81 years      BP:           135/72 mmHg Patient Gender: M             HR:           90 bpm. Exam Location:  Inpatient Procedure: 2D Echo Indications:    Chest pain  History:        Patient has no prior  history of Echocardiogram examinations.                 Risk Factors:Hypertension.  Sonographer:    Arlyss Gandy Referring Phys: [id number] [place]  1. Left ventricular ejection fraction, by estimation, is 55 to 60%. The left ventricle has normal function. The left ventricle has no regional wall motion abnormalities. There is mild concentric left ventricular hypertrophy. Left ventricular diastolic parameters were normal.  2. Right ventricular systolic function is normal. The right ventricular size is normal. There is normal pulmonary artery systolic pressure.  3. The mitral valve is abnormal. No evidence of mitral valve regurgitation. No evidence of mitral stenosis.  4. The aortic valve was not well visualized. Aortic valve regurgitation is not visualized. No aortic stenosis is present.  5. The inferior vena cava is dilated in size with >50% respiratory variability, suggesting right atrial pressure of 8 mmHg. Comparison(s): No prior Echocardiogram. FINDINGS  Left Ventricle: Left ventricular ejection fraction, by estimation, is 55 to 60%. The left ventricle has normal function. The left ventricle has no regional wall motion abnormalities. The left ventricular internal cavity size was normal in size. There is  mild concentric left ventricular hypertrophy. Left ventricular diastolic parameters were normal. Right Ventricle: The right ventricular size is normal. No increase in right ventricular wall thickness. Right ventricular systolic function is normal. There is normal pulmonary artery systolic pressure. The tricuspid regurgitant velocity is 2.29 m/s, and  with an assumed right atrial pressure of 8 mmHg, the estimated right ventricular systolic pressure is 74.2 mmHg. Left Atrium: Left atrial size was normal in size. Right Atrium: Right atrial size was normal in size. Pericardium: Trivial pericardial effusion is present. Mitral Valve:  Calcified echoendensity on the leaflet tips most consistent with  degenerative disease. The mitral valve is abnormal. No evidence of mitral valve regurgitation. No evidence of mitral valve stenosis. Tricuspid Valve: The tricuspid valve is normal in structure. Tricuspid valve regurgitation is not demonstrated. No evidence of tricuspid stenosis. Aortic Valve: The aortic valve was not well visualized. There is mild aortic valve annular calcification. Aortic valve regurgitation is not visualized. No aortic stenosis is present. Aortic valve mean gradient measures 4.0 mmHg. Aortic valve peak gradient measures 7.7 mmHg. Aortic valve area, by VTI measures 4.33 cm. Pulmonic Valve: The pulmonic valve was not well visualized. Pulmonic valve regurgitation is not visualized. No evidence of pulmonic stenosis. Aorta: The aortic root and ascending aorta are structurally normal, with no evidence of dilitation. Venous: The inferior vena cava is dilated in size with greater than 50% respiratory variability, suggesting right atrial pressure of 8 mmHg. IAS/Shunts: No atrial level shunt detected by color flow Doppler.  LEFT VENTRICLE PLAX 2D LVIDd:         4.20 cm   Diastology LVIDs:         3.00 cm   LV e' medial:    8.59 cm/s LV PW:         1.10 cm   LV E/e' medial:  12.6 LV IVS:        1.20 cm   LV e' lateral:   12.50 cm/s LVOT diam:     2.50 cm   LV E/e' lateral: 8.6 LV SV:         115 LV SV Index:   55 LVOT Area:     4.91 cm  RIGHT VENTRICLE             IVC RV Basal diam:  4.10 cm     IVC diam: 2.50 cm RV Mid diam:    3.10 cm RV S prime:     11.00 cm/s TAPSE (M-mode): 2.5 cm LEFT ATRIUM             Index        RIGHT ATRIUM           Index LA diam:        4.00 cm 1.93 cm/m   RA Area:     18.20 cm LA Vol (A2C):   54.0 ml 26.05 ml/m  RA Volume:   49.60 ml  23.93 ml/m LA Vol (A4C):   90.0 ml 43.42 ml/m LA Biplane Vol: 69.3 ml 33.43 ml/m  AORTIC VALVE AV Area (Vmax):    3.88 cm AV Area (Vmean):   3.87 cm AV Area (VTI):     4.33 cm AV Vmax:           139.00 cm/s AV Vmean:          97.900  cm/s AV VTI:            0.265 m AV Peak Grad:      7.7 mmHg AV Mean Grad:      4.0 mmHg LVOT Vmax:         110.00 cm/s LVOT Vmean:        77.100 cm/s LVOT VTI:          0.234 m LVOT/AV VTI ratio: 0.88  AORTA Ao Root diam: 3.50 cm Ao Asc diam:  3.40 cm MITRAL VALVE                TRICUSPID VALVE MV Area (PHT): 3.68 cm     TR Peak  grad:   21.0 mmHg MV Decel Time: 206 msec     TR Vmax:        229.00 cm/s MV E velocity: 108.00 cm/s MV A velocity: 78.40 cm/s   SHUNTS MV E/A ratio:  1.38         Systemic VTI:  0.23 m                             Systemic Diam: 2.50 cm Rudean Haskell MD Electronically signed by Rudean Haskell MD Signature Date/Time: 10/25/2021/2:47:21 PM    Final         Scheduled Meds:  folic acid  1 mg Oral Daily   lactulose  20 g Oral BID   magnesium oxide  400 mg Oral Daily   multivitamin with minerals  1 tablet Oral Daily   nadolol  20 mg Oral Daily   pantoprazole  40 mg Oral Daily   rifaximin  550 mg Oral TID   sodium chloride flush  3 mL Intravenous Q12H   thiamine  100 mg Oral Daily   Or   thiamine  100 mg Intravenous Daily   Continuous Infusions:  cefTRIAXone (ROCEPHIN)  IV 1 g (10/27/21 0928)     LOS: 2 days        Shelly Coss, MD [facility] P1/26/2023, 2:01 PM

## 2021-10-27 NOTE — NC FL2 (Signed)
[place] LEVEL OF CARE SCREENING TOOL     IDENTIFICATION  Patient Name: Dustin Ramirez Birthdate: 04/02/1941 Sex: male Admission Date (Current Location): 10/24/2021  [facility] and [place] Number:  Herbalist and Address:  [facility], [place] [address] [place], [place] [address]      Provider Number: [id number]  Attending Physician Name and Address:  Shelly Coss, MD  Relative Name and Phone Number:  Lurena Joiner Daughter   [phone]    Current Level of Care: Hospital Recommended Level of Care: [place] Prior Approval Number:    Date Approved/Denied:   PASRR Number: [id number]  Discharge Plan: SNF    Current Diagnoses: Patient Active Problem List   Diagnosis Date Noted   GAVE (gastric antral vascular ectasia)    Gastritis and gastroduodenitis    Portal hypertensive gastropathy (HCC)    Idiopathic esophageal varices without bleeding (HCC)    Symptomatic anemia 10/24/2021   Thrombocytopenia ([place]) 10/24/2021   AKI (acute kidney injury) ([place]) 10/24/2021   Abdominal distension 10/24/2021   Exertional chest pain 10/24/2021   Sebaceous cyst 05/01/2017   Clot retention of urine 05/12/2016   Malignant neoplasm of prostate ([place]) 03/09/2014   HTN (hypertension) 03/09/2014    Orientation RESPIRATION BLADDER Height & Weight     Self, Time, Situation, Place  Normal Incontinent Weight: 190 lb 11.2 oz (86.5 kg) Height:  5\' 11"  (180.3 cm)  BEHAVIORAL SYMPTOMS/MOOD NEUROLOGICAL BOWEL NUTRITION STATUS      Continent Diet (see discharge summary)  AMBULATORY STATUS COMMUNICATION OF NEEDS Skin   Limited Assist Verbally Normal                       Personal Care Assistance Level of Assistance  Bathing, Feeding, Dressing Bathing Assistance: Limited assistance Feeding assistance: Independent Dressing Assistance: Maximum assistance     Functional Limitations Info  Sight, Hearing, Speech Sight Info:  Adequate Hearing Info: Adequate Speech Info: Adequate    SPECIAL CARE FACTORS FREQUENCY  PT (By licensed PT), OT (By licensed OT)     PT Frequency: 5x week OT Frequency: 5x week            Contractures Contractures Info: Not present    Additional Factors Info  Code Status, Allergies Code Status Info: DNR Allergies Info: Penicillins           Current Medications (10/27/2021):  This is the current hospital active medication list Current Facility-Administered Medications  Medication Dose Route Frequency Provider Last Rate Last Admin   acetaminophen (TYLENOL) tablet 650 mg  650 mg Oral Q6H PRN Lenore Cordia, MD   650 mg at 10/27/21 0530   Or   acetaminophen (TYLENOL) suppository 650 mg  650 mg Rectal Q6H PRN Lenore Cordia, MD       cefTRIAXone (ROCEPHIN) 1 g in sodium chloride 0.9 % 100 mL IVPB  1 g Intravenous Q24H Shelly Coss, MD 200 mL/hr at 10/27/21 0928 1 g at 84/69/62 9528   folic acid (FOLVITE) tablet 1 mg  1 mg Oral Daily Adhikari, Tamsen Meek, MD   1 mg at 10/27/21 0902   furosemide (LASIX) tablet 20 mg  20 mg Oral Daily Adhikari, Amrit, MD       lactulose (CHRONULAC) 10 GM/15ML solution 20 g  20 g Oral BID Zehr, Jessica D, PA-C       LORazepam (ATIVAN) tablet 1-4 mg  1-4 mg Oral Q1H PRN Shelly Coss, MD  Or   LORazepam (ATIVAN) injection 1-4 mg  1-4 mg Intravenous Q1H PRN Adhikari, Amrit, MD       magnesium oxide (MAG-OX) tablet 400 mg  400 mg Oral Daily Shelly Coss, MD   400 mg at 10/27/21 1610   multivitamin with minerals tablet 1 tablet  1 tablet Oral Daily Shelly Coss, MD   1 tablet at 10/27/21 0902   nadolol (CORGARD) tablet 20 mg  20 mg Oral Daily Thornton Park, MD   20 mg at 10/27/21 1034   ondansetron (ZOFRAN) tablet 4 mg  4 mg Oral Q6H PRN Lenore Cordia, MD       Or   ondansetron (ZOFRAN) injection 4 mg  4 mg Intravenous Q6H PRN Lenore Cordia, MD       pantoprazole (PROTONIX) EC tablet 40 mg  40 mg Oral Daily Shelly Coss, MD    40 mg at 10/27/21 0902   rifaximin (XIFAXAN) tablet 550 mg  550 mg Oral TID Thornton Park, MD   550 mg at 10/27/21 0909   sodium chloride flush (NS) 0.9 % injection 3 mL  3 mL Intravenous Q12H Zada Finders R, MD   3 mL at 10/27/21 0902   thiamine tablet 100 mg  100 mg Oral Daily Shelly Coss, MD   100 mg at 10/27/21 9604   Or   thiamine (B-1) injection 100 mg  100 mg Intravenous Daily Shelly Coss, MD         Discharge Medications: Please see discharge summary for a list of discharge medications.  Relevant Imaging Results:  Relevant Lab Results:   Additional Information SSN: [id number].  Pt is vaccinated for covid but not boosted.  Joanne Chars, LCSW

## 2021-10-27 NOTE — Evaluation (Signed)
Physical Therapy Evaluation Patient Details Name: Dustin Ramirez MRN: [id number] DOB: Jan 30, 1941 Today's Date: 10/27/2021  History of Present Illness  This 81 y.o. male admitted with weakness and fatigue as well as nausea and anorexia as well as LE edema.  Dx:  symptomatic anemia, exertional CP, abdominal distension, AKI, thrombocytopenia, new onset alcohol related cirrhosis with ascitis.  PMH Includes: HTN, incomplete emptying of bladder, prostate CA, TURP  Clinical Impression   Pt admitted with above diagnosis. Lives at home alone; Adult children live nearby, can provide intermittent assist (they work); Presents to PT with generalized weakness, significant fatigue, and associated functional dependencies; Needed overall Mod assist with bed mobility and transfers, too fatigued to ambulate in room this afternoon;  Pt currently with functional limitations due to the deficits listed below (see PT Problem List). Pt will benefit from skilled PT to increase their independence and safety with mobility to allow discharge to the venue listed below.          Recommendations for follow up therapy are one component of a multi-disciplinary discharge planning process, led by the attending physician.  Recommendations may be updated based on patient status, additional functional criteria and insurance authorization.  Follow Up Recommendations Skilled nursing-short term rehab (<3 hours/day)    Assistance Recommended at Discharge Frequent or constant Supervision/Assistance  Patient can return home with the following  A lot of help with walking and/or transfers;A lot of help with bathing/dressing/bathroom;Assistance with cooking/housework    Equipment Recommendations Rolling walker (2 wheels);BSC/3in1  Recommendations for Other Services       Functional Status Assessment Patient has had a recent decline in their functional status and demonstrates the ability to make significant improvements in function in a  reasonable and predictable amount of time.     Precautions / Restrictions Precautions Precautions: Fall      Mobility  Bed Mobility Overal bed mobility: Needs Assistance Bed Mobility: Rolling, Sidelying to Sit Rolling: Min assist Sidelying to sit: Mod assist       General bed mobility comments: min handheld assist to pull over for rolling; Mod assist to lift trunk    Transfers Overall transfer level: Needs assistance Equipment used: 1 person hand held assist Transfers: Sit to/from Stand Sit to Stand: Mod assist           General transfer comment: Stood with handheld assist from EOB; very fatigued and needed mod assist to steady and rise    Ambulation/Gait Ambulation/Gait assistance: Min assist Gait Distance (Feet):  (sidesteps up to [facility]) Assistive device: 1 person hand held assist         General Gait Details: Bil UE support on this PT's shoulders; short steps  Stairs            Wheelchair Mobility    Modified Rankin (Stroke Patients Only)       Balance     Sitting balance-Leahy Scale: Fair       Standing balance-Leahy Scale: Poor                               Pertinent Vitals/Pain Pain Assessment Pain Assessment: 0-10 Pain Score: 7  Pain Location: back Pain Descriptors / Indicators: Aching Pain Intervention(s): Monitored during session    Home Living Family/patient expects to be discharged to:: Private residence Living Arrangements: Alone Available Help at Discharge: Family;Available PRN/intermittently Type of Home: House (townhouse) Home Access: Other (comment) (small step/threshold into front door)  Home Layout: Two level;Able to live on main level with bedroom/bathroom Home Equipment: Shower seat;Wheelchair - IT trainer (2 wheels);Cane - single point (pt has SPC and w/c.  Family has other DME available for use if pt needs)      Prior Function Prior Level of Function : Independent/Modified  Independent             Mobility Comments: Pt denies falls ADLs Comments: Pt reports he was fully independent with ADLs and IADLs     Hand Dominance   Dominant Hand: Right    Extremity/Trunk Assessment   Upper Extremity Assessment Upper Extremity Assessment: Defer to OT evaluation    Lower Extremity Assessment Lower Extremity Assessment: Generalized weakness    Cervical / Trunk Assessment Cervical / Trunk Assessment: Normal  Communication   Communication: No difficulties  Cognition Arousal/Alertness: Awake/alert Behavior During Therapy: WFL for tasks assessed/performed Overall Cognitive Status: Impaired/Different from baseline Area of Impairment: Attention, Following commands, Problem solving                   Current Attention Level: Selective   Following Commands: Follows one step commands consistently, Follows multi-step commands with increased time, Follows multi-step commands inconsistently     Problem Solving: Slow processing, Difficulty sequencing, Requires verbal cues General Comments: pt requires cues for problem solving and sequencing.  is distractable        General Comments General comments (skin integrity, edema, etc.): daughter present during session    Exercises     Assessment/Plan    PT Assessment Patient needs continued PT services  PT Problem List Decreased strength;Decreased range of motion;Decreased activity tolerance;Decreased balance;Decreased mobility;Decreased coordination;Decreased cognition;Decreased knowledge of use of DME;Decreased safety awareness;Decreased knowledge of precautions;Cardiopulmonary status limiting activity;Pain       PT Treatment Interventions DME instruction;Gait training;Stair training;Functional mobility training;Therapeutic activities;Therapeutic exercise;Balance training;Cognitive remediation;Patient/family education    PT Goals (Current goals can be found in the Care Plan section)  Acute Rehab PT  Goals Patient Stated Goal: Wants to get home PT Goal Formulation: With patient/family Time For Goal Achievement: 11/10/21 Potential to Achieve Goals: Fair    Frequency Min 2X/week (will adjust freq as needed if plan changes to going home)     Co-evaluation               AM-PAC PT "6 Clicks" Mobility  Outcome Measure Help needed turning from your back to your side while in a flat bed without using bedrails?: A Little Help needed moving from lying on your back to sitting on the side of a flat bed without using bedrails?: A Lot Help needed moving to and from a bed to a chair (including a wheelchair)?: A Lot Help needed standing up from a chair using your arms (e.g., wheelchair or bedside chair)?: A Lot Help needed to walk in hospital room?: A Lot Help needed climbing 3-5 steps with a railing? : Total 6 Click Score: 12    End of Session   Activity Tolerance: Patient limited by fatigue;Other (comment) (exhausted after working with OT approx 1 hour prior) Patient left: in bed;with call bell/phone within reach;with family/visitor present   PT Visit Diagnosis: Unsteadiness on feet (R26.81);Other abnormalities of gait and mobility (R26.89);Muscle weakness (generalized) (M62.81)    Time: 7628-3151 PT Time Calculation (min) (ACUTE ONLY): 21 min   Charges:   PT Evaluation $PT Eval Moderate Complexity: [place], [place] Pager [phone] Office  [phone]   Colletta [place] 10/27/2021, 2:13 PM

## 2021-10-27 NOTE — Progress Notes (Signed)
Pt seen in room c/o SOB, and  labored breathing, Pt in no apparent distress. 0xygen at 2L.min via [place], Pt transferred to recliner chair and sittting comfortably post cleaned up. Attending MD made aware of above and ordered, abg, chest xray and possible paracentesis tomorrow. Pt/dtr informed of above with no complaints. Pt closely monitored.

## 2021-10-27 NOTE — Progress Notes (Signed)
[facility] Progress Note  CC:  New cirrhosis and anemia  Subjective:  Feels ok.  No BM since admission.  Son and daughter at bedside.    Objective:  Vital signs in last 24 hours: Temp:  [98.2 F (36.8 C)-99 F (37.2 C)] 98.2 F (36.8 C) (01/25 2000) Pulse Rate:  [84-92] 89 (01/25 2000) Resp:  [17-24] 18 (01/25 2000) BP: (129-158)/(54-66) 131/54 (01/25 2000) SpO2:  [97 %-100 %] 97 % (01/25 2000) Weight:  [86.5 kg-87.6 kg] 86.5 kg (01/26 0500) Last BM Date: 10/23/21 General:  Alert, Well-developed, in NAD Heart:  Regular rate and rhythm; no murmurs Pulm:  CTAB.  No W/R/R. Abdomen:  Softly distended with ascites fluid.  BS present.  Non-tender. Extremities:  Without edema. Neurologic:  Alert and oriented x 4;  grossly normal neurologically. Psych:  Alert and cooperative. Normal mood and affect.  Intake/Output from previous day: 01/25 0701 - 01/26 0700 In: 1206.2 [I.V.:1104; IV Piggyback:102.3] Out: -   Lab Results: Recent Labs    10/25/21 0455 10/26/21 0539 10/27/21 0652  WBC 3.7* 3.7* 3.6*  HGB 7.3* 7.1* 7.2*  HCT 24.5* 22.8* 23.4*  PLT 93* 89* 90*   BMET Recent Labs    10/25/21 0455 10/26/21 0539 10/27/21 0652  NA 140 135 136  K 4.6 4.4 4.0  CL 111 105 107  CO2 22 22 24   GLUCOSE 133* 152* 143*  BUN 16 16 15   CREATININE 1.44* 1.57* 1.41*  CALCIUM 9.0 9.0 9.0   LFT Recent Labs    10/24/21 1417 10/25/21 0455  PROT 7.0 6.4*  ALBUMIN 2.6* 2.4*  AST 38 32  ALT 20 17  ALKPHOS 68 61  BILITOT 0.8 3.2*  BILIDIR 0.3*  --   IBILI 0.5  --    PT/INR Recent Labs    10/25/21 0455  LABPROT 18.7*  INR 1.6*   Hepatitis Panel Recent Labs    10/25/21 0455  HEPBSAG NON REACTIVE  HCVAB NON REACTIVE    ECHOCARDIOGRAM COMPLETE  Result Date: 10/25/2021    ECHOCARDIOGRAM REPORT   Patient Name:   ASHLAND WISEMAN [facility] Date of Exam: 10/25/2021 Medical Rec #:  [id number]     Height:       71.0 in Accession #:    [id number]    Weight:       192.1 lb  Date of Birth:  Aug 12, 1941     BSA:          2.073 m Patient Age:    81 years      BP:           135/72 mmHg Patient Gender: M             HR:           90 bpm. Exam Location:  Inpatient Procedure: 2D Echo Indications:    Chest pain  History:        Patient has no prior history of Echocardiogram examinations.                 Risk Factors:Hypertension.  Sonographer:    Arlyss Gandy Referring Phys: [id number] [place]  1. Left ventricular ejection fraction, by estimation, is 55 to 60%. The left ventricle has normal function. The left ventricle has no regional wall motion abnormalities. There is mild concentric left ventricular hypertrophy. Left ventricular diastolic parameters were normal.  2. Right ventricular systolic function is normal. The right ventricular size is normal. There is normal pulmonary artery  systolic pressure.  3. The mitral valve is abnormal. No evidence of mitral valve regurgitation. No evidence of mitral stenosis.  4. The aortic valve was not well visualized. Aortic valve regurgitation is not visualized. No aortic stenosis is present.  5. The inferior vena cava is dilated in size with >50% respiratory variability, suggesting right atrial pressure of 8 mmHg. Comparison(s): No prior Echocardiogram. FINDINGS  Left Ventricle: Left ventricular ejection fraction, by estimation, is 55 to 60%. The left ventricle has normal function. The left ventricle has no regional wall motion abnormalities. The left ventricular internal cavity size was normal in size. There is  mild concentric left ventricular hypertrophy. Left ventricular diastolic parameters were normal. Right Ventricle: The right ventricular size is normal. No increase in right ventricular wall thickness. Right ventricular systolic function is normal. There is normal pulmonary artery systolic pressure. The tricuspid regurgitant velocity is 2.29 m/s, and  with an assumed right atrial pressure of 8 mmHg, the estimated right ventricular  systolic pressure is 46.2 mmHg. Left Atrium: Left atrial size was normal in size. Right Atrium: Right atrial size was normal in size. Pericardium: Trivial pericardial effusion is present. Mitral Valve: Calcified echoendensity on the leaflet tips most consistent with degenerative disease. The mitral valve is abnormal. No evidence of mitral valve regurgitation. No evidence of mitral valve stenosis. Tricuspid Valve: The tricuspid valve is normal in structure. Tricuspid valve regurgitation is not demonstrated. No evidence of tricuspid stenosis. Aortic Valve: The aortic valve was not well visualized. There is mild aortic valve annular calcification. Aortic valve regurgitation is not visualized. No aortic stenosis is present. Aortic valve mean gradient measures 4.0 mmHg. Aortic valve peak gradient measures 7.7 mmHg. Aortic valve area, by VTI measures 4.33 cm. Pulmonic Valve: The pulmonic valve was not well visualized. Pulmonic valve regurgitation is not visualized. No evidence of pulmonic stenosis. Aorta: The aortic root and ascending aorta are structurally normal, with no evidence of dilitation. Venous: The inferior vena cava is dilated in size with greater than 50% respiratory variability, suggesting right atrial pressure of 8 mmHg. IAS/Shunts: No atrial level shunt detected by color flow Doppler.  LEFT VENTRICLE PLAX 2D LVIDd:         4.20 cm   Diastology LVIDs:         3.00 cm   LV e' medial:    8.59 cm/s LV PW:         1.10 cm   LV E/e' medial:  12.6 LV IVS:        1.20 cm   LV e' lateral:   12.50 cm/s LVOT diam:     2.50 cm   LV E/e' lateral: 8.6 LV SV:         115 LV SV Index:   55 LVOT Area:     4.91 cm  RIGHT VENTRICLE             IVC RV Basal diam:  4.10 cm     IVC diam: 2.50 cm RV Mid diam:    3.10 cm RV S prime:     11.00 cm/s TAPSE (M-mode): 2.5 cm LEFT ATRIUM             Index        RIGHT ATRIUM           Index LA diam:        4.00 cm 1.93 cm/m   RA Area:     18.20 cm LA Vol (A2C):   54.0 ml 26.05 ml/m  RA Volume:   49.60 ml  23.93 ml/m LA Vol (A4C):   90.0 ml 43.42 ml/m LA Biplane Vol: 69.3 ml 33.43 ml/m  AORTIC VALVE AV Area (Vmax):    3.88 cm AV Area (Vmean):   3.87 cm AV Area (VTI):     4.33 cm AV Vmax:           139.00 cm/s AV Vmean:          97.900 cm/s AV VTI:            0.265 m AV Peak Grad:      7.7 mmHg AV Mean Grad:      4.0 mmHg LVOT Vmax:         110.00 cm/s LVOT Vmean:        77.100 cm/s LVOT VTI:          0.234 m LVOT/AV VTI ratio: 0.88  AORTA Ao Root diam: 3.50 cm Ao Asc diam:  3.40 cm MITRAL VALVE                TRICUSPID VALVE MV Area (PHT): 3.68 cm     TR Peak grad:   21.0 mmHg MV Decel Time: 206 msec     TR Vmax:        229.00 cm/s MV E velocity: 108.00 cm/s MV A velocity: 78.40 cm/s   SHUNTS MV E/A ratio:  1.38         Systemic VTI:  0.23 m                             Systemic Diam: 2.50 cm Rudean Haskell MD Electronically signed by Rudean Haskell MD Signature Date/Time: 10/25/2021/2:47:21 PM    Final    IR Paracentesis  Result Date: 10/25/2021 INDICATION: Patient with history of alcohol abuse, prostate cancer. Presented to the ED with abdominal distension. Found to have ascites. Request is for therapeutic and diagnostic paracentesis EXAM: ULTRASOUND GUIDED diagnostic and therapeutic PARACENTESIS MEDICATIONS: Lidocaine 1% 10 mL COMPLICATIONS: None immediate. PROCEDURE: Informed written consent was obtained from the patient after a discussion of the risks, benefits and alternatives to treatment. A timeout was performed prior to the initiation of the procedure. Initial ultrasound scanning demonstrates a small amount of ascites within the left lower abdominal quadrant. The left lower abdomen was prepped and draped in the usual sterile fashion. 1% lidocaine was used for local anesthesia. Following this, a 19 gauge, 10-cm, Yueh catheter was introduced. An ultrasound image was saved for documentation purposes. The paracentesis was performed. The catheter was removed and a  dressing was applied. The patient tolerated the procedure well without immediate post procedural complication. FINDINGS: A total of approximately 2.2 L of straw-colored fluid was removed. Samples were sent to the laboratory as requested by the clinical team. IMPRESSION: Successful ultrasound-guided diagnostic and therapeutic paracentesis yielding 2.2 liters of peritoneal fluid. Read by: Rushie Nyhan, NP Electronically Signed   By: Corrie Mckusick D.O.   On: 10/25/2021 15:36    Assessment / Plan: New diagnosis of cirrhosis in an alcoholic with history of fatty liver.  Decompensated.  MELD-Na 12.  Child's score 11.     Cataract anemia.  With the exception of low ferritin, iron parameters elevated.  Appropriate response to 1 PRBC.  Hgb stable at 7.2 grams this AM.   Tarry stools about 10 days ago, resolved.  FOBT negative by DRE yesterday.  EGD 1/25 showed Grade I esophageal varices and Portal  hypertensive gastropathy with punctate GAVE. Biopsied--pending.   Thrombocytopenia:  Platelets 90.     Ascites-Paracentesis with fluid studies ordered, negative for SBP.    Moderately elevated BNP, echocardiogram completed.   Alcohol use disorder.   Watch for signs of active withdrawal.     Asterixis on exam:  Was started on lactulose 10 grams BID and xifaxan 550 mg BID.  No BM since admission.     hypomagnesemia, received IV replenishment.  -Await pathology results. - Plan outpatient serial hgb/hct with transfusion as indicated as slow GI blood loss may continue. - Prolonged abstinence from alcohol may improve the portal hypertensive gastropathy over time. - I am discontinuing octreotide.  Needs nadolol titrated up as tolerated as an outpatient. -Continue xifaxan 550 mg BID.  I increased lactulose to 20 grams BID since no BM since admission.  Goal is to titrate to 2-3 soft stools per day. -Complete 5 days of abx (currently on ceftriaxone day 3). -So far labs for causes of chronic liver disease are  negative but some are still pending. -? Eventual restart of diuretics as an outpatient at some point if electrolytes and renal function allow. - Outpatient follow-up with Dr. Melina Copa, his gastroenterologist in [place], for close monitoring on discharge. -GI signing off for now.  Please call with questions.    LOS: 2 days   Laban Emperor. Cambell Stanek  10/27/2021, 8:56 AM

## 2021-10-27 NOTE — Evaluation (Signed)
Occupational Therapy Evaluation Patient Details Name: Dustin Ramirez MRN: [id number] DOB: 06/25/1941 Today's Date: 10/27/2021   History of Present Illness This 81 y.o. male admitted with weakness and fatigue as well as nausea and anorexia as well as LE edema.  Dx:  symptomatic anemia, exertional CP, abdominal distension, AKI, thrombocytopenia, new onset alcohol related cirrhosis with ascitis.  PMH Includes: HTN, incomplete emptying of bladder, prostate CA, TURP   Clinical Impression   Pt admitted with above. He demonstrates the below listed deficits and will benefit from continued OT to maximize safety and independence with BADLs.  Pt presents to OT with generalized weakness, decreased activity tolerance, impaired balance, cognitive deficits.  He currently requires mod - max A for LB ADLs, set up - min A for UB ADLs and min A for functional mobility using RW.  He lives alone and children are available to assist some at discharge.  Currently, he will need someone with him most of the time due to the instability and fall risk.  If family is able to provide current level of assist, he could discharge home with HHOT/PT, but currently recommending SNF.  Will follow.        Recommendations for follow up therapy are one component of a multi-disciplinary discharge planning process, led by the attending physician.  Recommendations may be updated based on patient status, additional functional criteria and insurance authorization.   Follow Up Recommendations  Skilled nursing-short term rehab (<3 hours/day)    Assistance Recommended at Discharge Frequent or constant Supervision/Assistance  Patient can return home with the following A little help with walking and/or transfers;A little help with bathing/dressing/bathroom;Assistance with cooking/housework;Direct supervision/assist for medications management;Assist for transportation    Functional Status Assessment  Patient has had a recent decline in their  functional status and demonstrates the ability to make significant improvements in function in a reasonable and predictable amount of time.  Equipment Recommendations  None recommended by OT    Recommendations for Other Services       Precautions / Restrictions Precautions Precautions: Fall      Mobility Bed Mobility Overal bed mobility: Needs Assistance Bed Mobility: Rolling, Sidelying to Sit Rolling: Supervision Sidelying to sit: Min assist       General bed mobility comments: assist to lift trunk    Transfers Overall transfer level: Needs assistance Equipment used: Rolling walker (2 wheels) Transfers: Sit to/from Stand Sit to Stand: Min assist                  Balance Overall balance assessment: Needs assistance Sitting-balance support: Feet supported, No upper extremity supported Sitting balance-Leahy Scale: Fair     Standing balance support: Bilateral upper extremity supported, Reliant on assistive device for balance Standing balance-Leahy Scale: Poor                             ADL either performed or assessed with clinical judgement   ADL Overall ADL's : Needs assistance/impaired Eating/Feeding: Independent   Grooming: Wash/dry hands;Wash/dry face;Oral care;Brushing hair;Minimal assistance;Standing   Upper Body Bathing: Set up;Supervision/ safety;Sitting   Lower Body Bathing: Moderate assistance;Sit to/from stand   Upper Body Dressing : Minimal assistance;Sitting   Lower Body Dressing: Maximal assistance;Sit to/from stand   Toilet Transfer: Minimal assistance;Ambulation;Comfort height toilet;Rolling walker (2 wheels);Grab bars Toilet Transfer Details (indicate cue type and reason): assist for sit to stand and for balance Toileting- Clothing Manipulation and Hygiene: Maximal assistance;Sit to/from stand  Functional mobility during ADLs: Minimal assistance;Rolling walker (2 wheels) General ADL Comments: Pt moves slowly and is  tremulous.  He fatigues quickly     Vision Patient Visual Report: No change from baseline       Perception     Praxis      Pertinent Vitals/Pain Pain Assessment Pain Assessment: 0-10 Pain Score: 7  Pain Location: back Pain Descriptors / Indicators: Aching Pain Intervention(s): Monitored during session, Repositioned     Hand Dominance Right   Extremity/Trunk Assessment Upper Extremity Assessment Upper Extremity Assessment: Generalized weakness (bil. UEs tremulous)   Lower Extremity Assessment Lower Extremity Assessment: Defer to PT evaluation   Cervical / Trunk Assessment Cervical / Trunk Assessment: Normal   Communication Communication Communication: No difficulties   Cognition Arousal/Alertness: Awake/alert Behavior During Therapy: WFL for tasks assessed/performed Overall Cognitive Status: Impaired/Different from baseline Area of Impairment: Attention, Following commands, Problem solving                   Current Attention Level: Selective   Following Commands: Follows one step commands consistently, Follows multi-step commands with increased time, Follows multi-step commands inconsistently     Problem Solving: Slow processing, Difficulty sequencing, Requires verbal cues General Comments: pt requires cues for problem solving and sequencing.  is distractable     General Comments  son and daughter present during session    Exercises     Shoulder [place] expects to be discharged to:: Private residence Living Arrangements: Alone Available Help at Discharge: Family;Available PRN/intermittently Type of Home: House (townhouse) Home Access: Other (comment) (small step/threshold into front door)     Home Layout: Two level;Able to live on main level with bedroom/bathroom     Bathroom Shower/Tub: Occupational psychologist: Handicapped height     Home Equipment: [place];Wheelchair -  IT trainer (2 wheels);Cane - single point (pt has SPC and w/c.  Family has other DME available for use if pt needs)          Prior Functioning/Environment Prior Level of Function : Independent/Modified Independent             Mobility Comments: Pt denies falls ADLs Comments: Pt reports he was fully independent with ADLs and IADLs        OT Problem List: Decreased strength;Decreased activity tolerance;Impaired balance (sitting and/or standing);Decreased cognition;Decreased knowledge of use of DME or AE;Pain      OT Treatment/Interventions: Self-care/ADL training;DME and/or AE instruction;Therapeutic activities;Patient/family education;Balance training;Therapeutic exercise;Energy conservation;Cognitive remediation/compensation    OT Goals(Current goals can be found in the care plan section) Acute Rehab OT Goals Patient Stated Goal: to be able to go home OT Goal Formulation: With patient/family Time For Goal Achievement: 11/10/21 Potential to Achieve Goals: Good  OT Frequency: Min 2X/week    Co-evaluation              AM-PAC OT "6 Clicks" Daily Activity     Outcome Measure Help from another person eating meals?: None Help from another person taking care of personal grooming?: A Little Help from another person toileting, which includes using toliet, bedpan, or urinal?: A Lot Help from another person bathing (including washing, rinsing, drying)?: A Lot Help from another person to put on and taking off regular upper body clothing?: A Little Help from another person to put on and taking off regular lower body clothing?: A Lot 6 Click Score: 16   End of Session Equipment Utilized During Treatment:  Gait belt;Rolling walker (2 wheels) Nurse Communication: Mobility status  Activity Tolerance: Patient limited by fatigue Patient left: in chair;with call bell/phone within reach;with chair alarm set  OT Visit Diagnosis: Unsteadiness on feet (R26.81)                 Time: 8588-5027 OT Time Calculation (min): 38 min Charges:  OT General Charges $OT Visit: 1 Visit OT Evaluation $OT Eval Moderate Complexity: 1 Mod OT Treatments $Self Care/Home Management : 23-37 mins  Nilsa Nutting., OTR/L Acute Rehabilitation Services Pager [phone] Office [phone]   Lucille Passy M 10/27/2021, 11:44 AM

## 2021-10-27 NOTE — TOC Initial Note (Signed)
Transition of Care [facility]) - Initial/Assessment Note    Patient Details  Name: Dustin Ramirez MRN: [id number] Date of Birth: 21-Jan-1941  [facility]) CM/SW Contact:    Joanne Chars, LCSW Phone Number: 10/27/2021, 4:05 PM  Clinical Narrative:   CSW spoke with pt and daughter Colletta [place] about DC recommendation for SNF.  Permission given to speak with daughter Colletta [place] and son [name]. They are agreeable to SNF, choice document given, permission given to send out referral in hub.  Colletta Maryland would like to be contacted about bed offers and wants pt placed in [place], [facility] is first choice.  Pt is vaccinated for covid but not boosted.                 Expected Discharge Plan: Skilled Nursing Facility Barriers to Discharge: SNF Pending bed offer   Patient Goals and CMS Choice Patient states their goals for this hospitalization and ongoing recovery are:: be more independent CMS Medicare.gov Compare Post Acute Care list provided to:: Patient Represenative (must comment) Choice offered to / list presented to : Adult Children  Expected Discharge Plan and Services Expected Discharge Plan: [place] In-house Referral: Clinical Social Work   Post Acute Care Choice: [place] Living arrangements for the past 2 months: [place]                                      Prior Living Arrangements/Services Living arrangements for the past 2 months: Single Family Home Lives with:: Self Patient language and need for interpreter reviewed:: Yes Do you feel safe going back to the place where you live?: Yes      Need for Family Participation in Patient Care: Yes (Comment) Care giver support system in place?: Yes (comment) Current home services: Other (comment) (none) Criminal Activity/Legal Involvement Pertinent to Current Situation/Hospitalization: No - Comment as needed  Activities of Daily Living Home Assistive Devices/Equipment:  None ADL Screening (condition at time of admission) Patient's cognitive ability adequate to safely complete daily activities?: Yes Is the patient deaf or have difficulty hearing?: No Does the patient have difficulty seeing, even when wearing glasses/contacts?: No Does the patient have difficulty concentrating, remembering, or making decisions?: No Patient able to express need for assistance with ADLs?: No Does the patient have difficulty dressing or bathing?: No Independently performs ADLs?: Yes (appropriate for developmental age) Communication: Independent Dressing (OT): Independent Grooming: Independent Feeding: Independent Bathing: Independent Toileting: Independent In/Out Bed: Independent Walks in Home: Independent Does the patient have difficulty walking or climbing stairs?: No Weakness of Legs: Both Weakness of Arms/Hands: Both  Permission Sought/Granted Permission sought to share information with : Family Supports Permission granted to share information with : Yes, Verbal Permission Granted  Share Information with NAME: daughter Colletta [place], son [name]  Permission granted to share info w AGENCY: SNF        Emotional Assessment Appearance:: Appears stated age Attitude/Demeanor/Rapport: Engaged Affect (typically observed): Appropriate Orientation: : Oriented to Self, Oriented to Place, Oriented to  Time, Oriented to Situation Alcohol / Substance Use: Not Applicable Psych Involvement: No (comment)  Admission diagnosis:  Abdominal distension [R14.0] Weakness [R53.1] Dyspnea [R06.00] Ascites [R18.8] Symptomatic anemia [D64.9] Patient Active Problem List   Diagnosis Date Noted   GAVE (gastric antral vascular ectasia)    Gastritis and gastroduodenitis    Portal hypertensive gastropathy (HCC)    Idiopathic esophageal varices without bleeding ([place])  Symptomatic anemia 10/24/2021   Thrombocytopenia ([place]) 10/24/2021   AKI (acute kidney injury) ([place]) 10/24/2021    Abdominal distension 10/24/2021   Exertional chest pain 10/24/2021   Sebaceous cyst 05/01/2017   Clot retention of urine 05/12/2016   Malignant neoplasm of prostate ([place]) 03/09/2014   HTN (hypertension) 03/09/2014   PCP:  Ronita Hipps, MD Pharmacy:   [place], [place] [address] Phone: [phone] Fax: [place], [place] [address] Phone: [phone] Fax: [phone]     Social Determinants of Health (SDOH) Interventions    Readmission Risk Interventions No flowsheet data found.

## 2021-10-28 ENCOUNTER — Inpatient Hospital Stay (HOSPITAL_COMMUNITY): Payer: Medicare HMO

## 2021-10-28 DIAGNOSIS — K659 Peritonitis, unspecified: Secondary | ICD-10-CM

## 2021-10-28 DIAGNOSIS — D649 Anemia, unspecified: Secondary | ICD-10-CM

## 2021-10-28 DIAGNOSIS — K31819 Angiodysplasia of stomach and duodenum without bleeding: Secondary | ICD-10-CM | POA: Diagnosis not present

## 2021-10-28 HISTORY — PX: IR PARACENTESIS: IMG2679

## 2021-10-28 LAB — CBC WITH DIFFERENTIAL/PLATELET
Abs Immature Granulocytes: 0.02 10*3/uL (ref 0.00–0.07)
Basophils Absolute: 0.1 10*3/uL (ref 0.0–0.1)
Basophils Relative: 1 %
Eosinophils Absolute: 0.2 10*3/uL (ref 0.0–0.5)
Eosinophils Relative: 3 %
HCT: 27.1 % — ABNORMAL LOW (ref 39.0–52.0)
Hemoglobin: 8.1 g/dL — ABNORMAL LOW (ref 13.0–17.0)
Immature Granulocytes: 0 %
Lymphocytes Relative: 16 %
Lymphs Abs: 1 10*3/uL (ref 0.7–4.0)
MCH: 25.8 pg — ABNORMAL LOW (ref 26.0–34.0)
MCHC: 29.9 g/dL — ABNORMAL LOW (ref 30.0–36.0)
MCV: 86.3 fL (ref 80.0–100.0)
Monocytes Absolute: 0.6 10*3/uL (ref 0.1–1.0)
Monocytes Relative: 9 %
Neutro Abs: 4.6 10*3/uL (ref 1.7–7.7)
Neutrophils Relative %: 71 %
Platelets: 119 10*3/uL — ABNORMAL LOW (ref 150–400)
RBC: 3.14 MIL/uL — ABNORMAL LOW (ref 4.22–5.81)
RDW: 15.8 % — ABNORMAL HIGH (ref 11.5–15.5)
WBC: 6.5 10*3/uL (ref 4.0–10.5)
nRBC: 0 % (ref 0.0–0.2)

## 2021-10-28 LAB — BASIC METABOLIC PANEL
Anion gap: 6 (ref 5–15)
BUN: 15 mg/dL (ref 8–23)
CO2: 24 mmol/L (ref 22–32)
Calcium: 9.2 mg/dL (ref 8.9–10.3)
Chloride: 105 mmol/L (ref 98–111)
Creatinine, Ser: 1.42 mg/dL — ABNORMAL HIGH (ref 0.61–1.24)
GFR, Estimated: 50 mL/min — ABNORMAL LOW (ref >60)
Glucose, Bld: 143 mg/dL — ABNORMAL HIGH (ref 70–99)
Potassium: 4.4 mmol/L (ref 3.5–5.1)
Sodium: 135 mmol/L (ref 135–145)

## 2021-10-28 LAB — RESP PANEL BY RT-PCR (FLU A&B, COVID) ARPGX2
Influenza A by PCR: NEGATIVE
Influenza B by PCR: NEGATIVE
SARS Coronavirus 2 by RT PCR: NEGATIVE

## 2021-10-28 LAB — CULTURE, BODY FLUID W GRAM STAIN -BOTTLE

## 2021-10-28 MED ORDER — LIDOCAINE HCL 1 % IJ SOLN
INTRAMUSCULAR | Status: AC
  Filled 2021-10-28: qty 20

## 2021-10-28 MED ORDER — METRONIDAZOLE 500 MG PO TABS: 500.0000 mg | ORAL_TABLET | Freq: Three times a day (TID) | ORAL | Status: DC

## 2021-10-28 MED ORDER — RIFAXIMIN 550 MG PO TABS
550.0000 mg | ORAL_TABLET | Freq: Two times a day (BID) | ORAL | Status: DC
  Administered 2021-10-28 – 2021-10-29 (×2): 550 mg via ORAL
  Filled 2021-10-28 (×3): qty 1

## 2021-10-28 MED ORDER — LIDOCAINE HCL (PF) 1 % IJ SOLN
INTRAMUSCULAR | Status: DC | PRN
  Administered 2021-10-28: 10 mL

## 2021-10-28 MED ORDER — METRONIDAZOLE 500 MG PO TABS
500.0000 mg | ORAL_TABLET | Freq: Two times a day (BID) | ORAL | Status: DC
  Administered 2021-10-28 – 2021-10-29 (×3): 500 mg via ORAL
  Filled 2021-10-28 (×3): qty 1

## 2021-10-28 NOTE — TOC Progression Note (Signed)
Transition of Care [facility]) - Progression Note    Patient Details  Name: Dustin Ramirez MRN: [id number] Date of Birth: [birth year]  Transition of Care [facility]) CM/SW [place], [place] Phone Number: 10/28/2021, 2:01 PM  Clinical Narrative:    CSW spoke with daughter and she is agreeable to [facility], she was advised a bed would be available tomorrow. MD stated pt will be medically ready tomorrow and will order covid test. Linus Orn at [facility] has auth and notes a bed will be available tomorrow. TOC will follow up with facility tomorrow. TOC will continue to follow for DC needs.   Expected Discharge Plan: Skilled Nursing Facility Barriers to Discharge: SNF Pending bed offer  Expected Discharge Plan and Services Expected Discharge Plan: [place] In-house Referral: Clinical Social Work   Post Acute Care Choice: [place] Living arrangements for the past 2 months: Single Family Home                                       Social Determinants of Health (SDOH) Interventions    Readmission Risk Interventions No flowsheet data found.

## 2021-10-28 NOTE — Progress Notes (Signed)
° ° ° °  [facility] Progress Note   Pathology results are now available from biopsies obtained during recent EGD.    A. STOMACH, BIOPSY:  - Gastric antral mucosa with superficial vascular congestion and mild  RBC extravasation, compatible with clinical impression of gastric antral  vascular ectasia.  - Negative for H. pylori, dysplasia, and malignancy.   These pathology results confirm our endoscopic findings. No new recommendations at this time.   Please call the on-call gastroenterologist with any additional questions or concerns during this hospitalization.

## 2021-10-28 NOTE — Care Management Important Message (Signed)
Important Message  Patient Details  Name: Dustin Ramirez MRN: [id number] Date of Birth: 03/08/1941   Medicare Important Message Given:  Yes     Hannah Beat 10/28/2021, 12:37 PM

## 2021-10-28 NOTE — Consult Note (Signed)
[facility] for Infectious Disease    Date of Admission:  10/24/2021     Reason for Consult: bacterial ascites    Referring Provider: Tawanna Solo   Abx: 1/24-c ceftriaxone 1/27-c metronidazole 1/24-c rifaximin        Assessment: 81 yo male hx prostate cancer, alcohol abuse admitted with fatigue in setting of tarry stool/anemia labs, found to have cirrhosis and also bacterial ascites  Bacteroides/clostridial species on 1/24 ascites culture. These are not organisms of SBP but rather 2nd peritonitis. However, no sign of bowel perforation or other pocket of infection otherwise  Suspect translocation into ascites with gib?  Would need treatment  Plan: Finish 5 day course ceftriaxone for cirrhosis/ascites and gib prophylaxis Finish 7 day metronidazole Defer to GI with regard to sbp prophy ID team will sign off Discussed with primary team       ------------------------------------------------ Principal Problem:   Symptomatic anemia Active Problems:   HTN (hypertension)   Thrombocytopenia (HCC)   AKI (acute kidney injury) ([place])   Abdominal distension   Exertional chest pain   Gastritis and gastroduodenitis   Portal hypertensive gastropathy (HCC)   Idiopathic esophageal varices without bleeding (HCC)   GAVE (gastric antral vascular ectasia)    HPI: Dustin Ramirez is a 81 y.o. male alcoholic admitted for several days of malaise/fatigue/tarry stool found to have anemia and cirrhosis, and also bacteria in ascites  He has been a heavy alcohol user He reports gi discomfort and distension associated with tarry stools, poor appetite, malaise. Associated bilateral LE swelling as well  No fever, chill  On admission no sepsis has been demonstrated Confusion and asterixis noted on previous exam started on lactulose/rifaximin  Gi following. Egd with grade 1 esophageal varices and gastric vascular ectasia. Biopsy negative for dysplasia/malignancy/h-pylori. No active  bleeding   On prophy ceftriaxone Had diagnostic paracentesis on 1/24 -- wbc 66. Cx with clostridial and bacteroides species  No sign of bowel perf or peritonitis otherwise  Awaiting disposition      Family History  Problem Relation Age of Onset   Cancer Mother        colon   Cancer Father        lung   Diabetes Brother     Social History   Tobacco Use   Smoking status: Former    Years: 1.00    Types: Cigarettes    Quit date: 05/02/1963    Years since quitting: 58.5   Smokeless tobacco: Never  Vaping Use   Vaping Use: Never used  Substance Use Topics   Alcohol use: Yes    Alcohol/week: 14.0 standard drinks    Types: 14 Glasses of wine per week    Comment: 2 glasses wine per night   Drug use: No    Allergies  Allergen Reactions   Penicillins Anaphylaxis and Other (See Comments)    Tolerated Rocephin. Has patient had a PCN reaction causing immediate rash, facial/tongue/throat swelling, SOB or lightheadedness with hypotension: Yes Has patient had a PCN reaction causing severe rash involving mucus membranes or skin necrosis: No Has patient had a PCN reaction that required hospitalization No Has patient had a PCN reaction occurring within the last 10 years: No If all of the above answers are "NO", then may proceed with Cephalosporin use.    Review of Systems: ROS All Other ROS was negative, except mentioned above   Past Medical History:  Diagnosis Date   ED (erectile dysfunction)  History of urinary retention    Hypertension    Incomplete emptying of bladder    Prostate cancer [facility]) urologist-  dr Karsten Ro   dx 2015  s/p  radiative prostate seed implants  05-08-2014   Prostate hyperplasia with urinary obstruction    Wears contact lenses        Scheduled Meds:  folic acid  1 mg Oral Daily   furosemide  20 mg Oral Daily   lactulose  20 g Oral BID   magnesium oxide  400 mg Oral Daily   metroNIDAZOLE  500 mg Oral Q12H   multivitamin with minerals  1  tablet Oral Daily   nadolol  20 mg Oral Daily   pantoprazole  40 mg Oral Daily   rifaximin  550 mg Oral BID   sodium chloride flush  3 mL Intravenous Q12H   thiamine  100 mg Oral Daily   Or   thiamine  100 mg Intravenous Daily   Continuous Infusions:  cefTRIAXone (ROCEPHIN)  IV 1 g (10/28/21 0916)   PRN Meds:.acetaminophen **OR** acetaminophen, LORazepam **OR** LORazepam, ondansetron **OR** ondansetron (ZOFRAN) IV   OBJECTIVE: Blood pressure (!) 116/59, pulse 68, temperature 98.1 F (36.7 C), temperature source Oral, resp. rate 16, height 5\' 11"  (1.803 m), weight 86.2 kg, SpO2 99 %.  Physical Exam General/constitutional: no distress, pleasant HEENT: Normocephalic, PER, Conj Clear, EOMI, Oropharynx clear Neck supple CV: rrr no mrg Lungs: clear to auscultation, normal respiratory effort Abd: Soft, distended Ext: bilateral LE edema trace Skin: No Rash Neuro: nonfocal MSK: no peripheral joint swelling/tenderness/warmth; back spines nontender    Lab Results Lab Results  Component Value Date   WBC 6.5 10/28/2021   HGB 8.1 (L) 10/28/2021   HCT 27.1 (L) 10/28/2021   MCV 86.3 10/28/2021   PLT 119 (L) 10/28/2021    Lab Results  Component Value Date   CREATININE 1.42 (H) 10/28/2021   BUN 15 10/28/2021   NA 135 10/28/2021   K 4.4 10/28/2021   CL 105 10/28/2021   CO2 24 10/28/2021    Lab Results  Component Value Date   ALT 17 10/25/2021   AST 32 10/25/2021   ALKPHOS 61 10/25/2021   BILITOT 3.2 (H) 10/25/2021      Microbiology: Recent Results (from the past 240 hour(s))  Resp Panel by RT-PCR (Flu A&B, Covid) Nasopharyngeal Swab     Status: None   Collection Time: 10/24/21 12:32 PM   Specimen: Nasopharyngeal Swab; Nasopharyngeal(NP) swabs in vial transport medium  Result Value Ref Range Status   SARS Coronavirus 2 by RT PCR NEGATIVE NEGATIVE Final    Comment: (NOTE) SARS-CoV-2 target nucleic acids are NOT DETECTED.  The SARS-CoV-2 RNA is generally detectable in  upper respiratory specimens during the acute phase of infection. The lowest concentration of SARS-CoV-2 viral copies this assay can detect is 138 copies/mL. A negative result does not preclude SARS-Cov-2 infection and should not be used as the sole basis for treatment or other patient management decisions. A negative result may occur with  improper specimen collection/handling, submission of specimen other than nasopharyngeal swab, presence of viral mutation(s) within the areas targeted by this assay, and inadequate number of viral copies(<138 copies/mL). A negative result must be combined with clinical observations, patient history, and epidemiological information. The expected result is Negative.  Fact Sheet for Patients:  EntrepreneurPulse.com.au  Fact Sheet for Healthcare Providers:  IncredibleEmployment.be  This test is no t yet approved or cleared by the [place] FDA and  has  been authorized for detection and/or diagnosis of SARS-CoV-2 by FDA under an Emergency Use Authorization (EUA). This EUA will remain  in effect (meaning this test can be used) for the duration of the COVID-19 declaration under Section 564(b)(1) of the Act, 21 U.S.C.section 360bbb-3(b)(1), unless the authorization is terminated  or revoked sooner.       Influenza A by PCR NEGATIVE NEGATIVE Final   Influenza B by PCR NEGATIVE NEGATIVE Final    Comment: (NOTE) The Xpert Xpress SARS-CoV-2/FLU/RSV plus assay is intended as an aid in the diagnosis of influenza from Nasopharyngeal swab specimens and should not be used as a sole basis for treatment. Nasal washings and aspirates are unacceptable for Xpert Xpress SARS-CoV-2/FLU/RSV testing.  Fact Sheet for Patients: EntrepreneurPulse.com.au  Fact Sheet for Healthcare Providers: IncredibleEmployment.be  This test is not yet approved or cleared by the [place] FDA and has been  authorized for detection and/or diagnosis of SARS-CoV-2 by FDA under an Emergency Use Authorization (EUA). This EUA will remain in effect (meaning this test can be used) for the duration of the COVID-19 declaration under Section 564(b)(1) of the Act, 21 U.S.C. section 360bbb-3(b)(1), unless the authorization is terminated or revoked.  Performed at [facility], [place] [address] [place], [place] [address]   Culture, body fluid w Gram Stain-bottle     Status: None   Collection Time: 10/25/21 12:46 PM   Specimen: Peritoneal Washings  Result Value Ref Range Status   Specimen Description PERITONEAL  Final   Special Requests ABDOMEN  Final   Culture   Final    ABUNDANT CLOSTRIDIUM INNOCUUM CRITICAL RESULT CALLED TO, READ BACK BY AND VERIFIED WITH: RN DANIEL L [id number] FCP FEW BACTEROIDES OVATUS BETA LACTAMASE POSITIVE CRITICAL RESULT CALLED TO, READ BACK BY AND VERIFIED WITH: RN Earl Gala [id number] AT 57 BY CM Performed at [facility], [place] [address], [place], [place] [address]    Report Status 10/28/2021 FINAL  Final  Gram stain     Status: None   Collection Time: 10/25/21 12:46 PM   Specimen: Peritoneal Washings  Result Value Ref Range Status   Specimen Description PERITONEAL  Final   Special Requests PERITONEAL  Final   Gram Stain   Final    WBC PRESENT, PREDOMINANTLY MONONUCLEAR NO ORGANISMS SEEN CYTOSPIN SMEAR Performed at [facility], [place] [address], [place], [place] [address]    Report Status 10/26/2021 FINAL  Final     Serology:    Imaging: If present, new imagings (plain films, ct scans, and mri) have been personally visualized and interpreted; radiology reports have been reviewed. Decision making incorporated into the Impression / Recommendations.  1/23 abd u/s Changes of cirrhosis.  Associated splenomegaly and ascites.   8 mm gallbladder wall polyp. No stones or evidence of acute cholecystitis.  Jabier Mutton, [place] for  Infectious [place] [phone] pager    10/28/2021, 3:34 PM

## 2021-10-28 NOTE — Procedures (Signed)
PROCEDURE SUMMARY:  Successful US guided therapeutic  paracentesis from RLQ.  Yielded 1.2 L of clear, yellow fluid.  No immediate complications.  Pt tolerated well.   Specimen was sent for labs.  EBL < 1 mL  Tyson Alias, AGNP 10/28/2021 5:07 PM

## 2021-10-28 NOTE — Progress Notes (Signed)
PROGRESS NOTE    Dustin Ramirez  OIN:[id number] DOB: 11/10/1940 DOA: 10/24/2021 PCP: Ronita Hipps, MD   Chief Complain: Weakness, fatigue, dark stool  Brief Narrative: Patient is a 81 year old male with history of prostate cancer status postradiation seeding, hypertension, peripheral vascular disease who presented to the emergency room with complaints of fatigue, dark stools.  He had recent history of gastroenteritis and was having loose dark black stools, weakness, fatigue, poor appetite, bedbound for 2 days.Also reported bilateral lower extremity swelling, exertional dyspnea.  Takes Advil nightly, aspirin 81 mg.  On presentation he was tachycardic, blood pressure was stable.  Hemoglobin was 6.3, creatinine of 1.5, BNP of 210.  Patient was ordered a unit of blood transfusion.  FOBT was negative.  Started on Protonix infusion for suspicion of GI bleed.  GI consulted ,underwent EGD on 10/26/21, found to have nonbleeding esophageal varices, portal hypertensive changes.  PT/OT recommending skilled nursing facility on discharge.  Medically stable for discharge as soon as bed is available.  Assessment & Plan:   Principal Problem:   Symptomatic anemia Active Problems:   HTN (hypertension)   Thrombocytopenia (HCC)   AKI (acute kidney injury) (HCC)   Abdominal distension   Exertional chest pain   Gastritis and gastroduodenitis   Portal hypertensive gastropathy (HCC)   Idiopathic esophageal varices without bleeding (HCC)   GAVE (gastric antral vascular ectasia)   Symptomatic anemia: Presented with fatigue, dark stools.  Takes Advil, aspirin.  FOBT negative.  Hemoglobin was 6.3 on admission.  Transfused with 2 units of PRBC in total.  Hemoglobin in the range of 7-8. Continue monitoring  Suspected upper GI bleed: History of melena recently.  Suspicion for GI bleed, possible variceal bleeding on the background of cirrhosis.  GI consulted.  Started on ceftriaxone, octreotide.  Underwent EGD  with  finding  non-bleeding esophageal varices and portal hypertensive gastropathy with punctate GAVE.   Started on nadolol for portal hypertension.  We will continue ceftriaxone for total of 5 days.  Continue lactulose, rifaximin  Cirrhosis/ascites: History of heavy/moderate alcohol .still drinking.Ultrasound showed cirrhosis, ascites.   Underwent paracentesis with removal of 2.2 L of fluid.  No evidence of SBP, transudative in nature.  Abdomen is again distended but not tense.Checking with IR if we can do another paracentesis today.  Exertional chest pain:   Described pain as sharp, pressure-like.  Currently chest pain-free.  Echo showed EF of 55 to 60%, no regional wall motion abnormality  Abdominal distention/bilateral lower extremity edema: Recent right venous Doppler negative for DVT.  BNP mildly elevated.  Echocardiogram as above. Edema likely associated with cirrhosis, low albumin Ultrasound of the abdomen showed changes of cirrhosis.,associated splenomegaly and ascites.8 mm gallbladder wall polyp. No stones or evidence of acute cholecystitis. He takes Lasix 20 mg daily at home.  Chronic alcohol abuse: Still drinking.  Continues to drink.  Motivated to stop but not interested in alcohol rehabilitation.  Continue thiamine folic acid   AKI: History of recent diarrhea, melena.    Last creatinine was 1.06 in 2017.  Creatinine pleatued at 1.5.  This could be his new baseline  Thrombocytopenia: Mild.  Continue to monitor.  Likely associated with cirrhosis, splenomegaly  Hypertension: Currently blood pressure stable.  Lisinopril on hold  Debility/deconditioning: PT/OT consulted.  Recommended skilled nursing facility on discharge  Nutrition Problem: Increased nutrient needs Etiology: cancer and cancer related treatments      DVT prophylaxis:SCD Code Status: Full Family Communication: Son at bedside on 1/27 Patient status:Inpatient  Dispo: The patient is from: Home              Anticipated  d/c is to: SNF              Anticipated d/c date is: As soon as bed is available  Consultants: GI  Procedures:EGD  Antimicrobials:  Anti-infectives (From admission, onward)    Start     Dose/Rate Route Frequency Ordered Stop   10/25/21 1600  rifaximin (XIFAXAN) tablet 550 mg        550 mg Oral 3 times daily 10/25/21 1540     10/25/21 0915  cefTRIAXone (ROCEPHIN) 1 g in sodium chloride 0.9 % 100 mL IVPB        1 g 200 mL/hr over 30 Minutes Intravenous Every 24 hours 10/25/21 0902 10/30/21 0914       Subjective: Patient seen and examined at the bedside this morning.  He appears overall comfortable.  Denies any new complaints.  He was lying on the recliner.  His abdomen is distended but not tense.  He is agreeable/ for skilled nursing facility placement  Objective: Vitals:   10/27/21 2013 10/28/21 0500 10/28/21 0517 10/28/21 0801  BP: 124/61  (!) 139/59 (!) 132/57  Pulse: 64  74 74  Resp: 15  16 16   Temp: 97.7 F (36.5 C)  98.2 F (36.8 C) 99.5 F (37.5 C)  TempSrc: Oral   Oral  SpO2: 98%  99% 98%  Weight:  86.2 kg    Height:        Intake/Output Summary (Last 24 hours) at 10/28/2021 1120 Last data filed at 10/28/2021 0659 Gross per 24 hour  Intake 1077.61 ml  Output 1200 ml  Net -122.39 ml   Filed Weights   10/26/21 1030 10/27/21 0500 10/28/21 0500  Weight: 87.6 kg 86.5 kg 86.2 kg    Examination:   General exam: Very deconditioned, chronically ill looking.  Overall looks comfortable HEENT: PERRL Respiratory system:  no wheezes or crackles  Cardiovascular system: S1 & S2 heard, RRR.  Gastrointestinal system: Abdomen is distended, soft and nontender. Central nervous system: Alert and oriented Extremities: No edema, no clubbing ,no cyanosis Skin: No rashes, no ulcers,no icterus    Data Reviewed: I have personally reviewed following labs and imaging studies  CBC: Recent Labs  Lab 10/24/21 1257 10/24/21 2335 10/25/21 0455 10/26/21 0539 10/27/21 0652  10/28/21 0137  WBC 4.4  --  3.7* 3.7* 3.6* 6.5  NEUTROABS 2.8  --   --  2.3 2.3 4.6  HGB 6.3* 6.5* 7.3* 7.1* 7.2* 8.1*  HCT 21.6* 21.1* 24.5* 22.8* 23.4* 27.1*  MCV 86.7  --  88.8 85.1 85.7 86.3  PLT 112*  --  93* 89* 90* 737*   Basic Metabolic Panel: Recent Labs  Lab 10/24/21 1257 10/25/21 0455 10/26/21 0539 10/27/21 0652 10/28/21 0137  NA 139 140 135 136 135  K 4.1 4.6 4.4 4.0 4.4  CL 107 111 105 107 105  CO2 22 22 22 24 24   GLUCOSE 229* 133* 152* 143* 143*  BUN 18 16 16 15 15   CREATININE 1.53* 1.44* 1.57* 1.41* 1.42*  CALCIUM 9.2 9.0 9.0 9.0 9.2  MG  --  1.6* 1.7  --   --    GFR: Estimated Creatinine Clearance: 44.9 mL/min (A) (by C-G formula based on SCr of 1.42 mg/dL (H)). Liver Function Tests: Recent Labs  Lab 10/24/21 1417 10/25/21 0455  AST 38 32  ALT 20 17  ALKPHOS 68 61  BILITOT  0.8 3.2*  PROT 7.0 6.4*  ALBUMIN 2.6* 2.4*   No results for input(s): LIPASE, AMYLASE in the last 168 hours. No results for input(s): AMMONIA in the last 168 hours. Coagulation Profile: Recent Labs  Lab 10/25/21 0455  INR 1.6*   Cardiac Enzymes: No results for input(s): CKTOTAL, CKMB, CKMBINDEX, TROPONINI in the last 168 hours. BNP (last 3 results) No results for input(s): PROBNP in the last 8760 hours. HbA1C: No results for input(s): HGBA1C in the last 72 hours.  CBG: No results for input(s): GLUCAP in the last 168 hours. Lipid Profile: No results for input(s): CHOL, HDL, LDLCALC, TRIG, CHOLHDL, LDLDIRECT in the last 72 hours. Thyroid Function Tests: No results for input(s): TSH, T4TOTAL, FREET4, T3FREE, THYROIDAB in the last 72 hours. Anemia Panel: No results for input(s): VITAMINB12, FOLATE, FERRITIN, TIBC, IRON, RETICCTPCT in the last 72 hours.  Sepsis Labs: No results for input(s): PROCALCITON, LATICACIDVEN in the last 168 hours.  Recent Results (from the past 240 hour(s))  Resp Panel by RT-PCR (Flu A&B, Covid) Nasopharyngeal Swab     Status: None    Collection Time: 10/24/21 12:32 PM   Specimen: Nasopharyngeal Swab; Nasopharyngeal(NP) swabs in vial transport medium  Result Value Ref Range Status   SARS Coronavirus 2 by RT PCR NEGATIVE NEGATIVE Final    Comment: (NOTE) SARS-CoV-2 target nucleic acids are NOT DETECTED.  The SARS-CoV-2 RNA is generally detectable in upper respiratory specimens during the acute phase of infection. The lowest concentration of SARS-CoV-2 viral copies this assay can detect is 138 copies/mL. A negative result does not preclude SARS-Cov-2 infection and should not be used as the sole basis for treatment or other patient management decisions. A negative result may occur with  improper specimen collection/handling, submission of specimen other than nasopharyngeal swab, presence of viral mutation(s) within the areas targeted by this assay, and inadequate number of viral copies(<138 copies/mL). A negative result must be combined with clinical observations, patient history, and epidemiological information. The expected result is Negative.  Fact Sheet for Patients:  EntrepreneurPulse.com.au  Fact Sheet for Healthcare Providers:  IncredibleEmployment.be  This test is no t yet approved or cleared by the [place] FDA and  has been authorized for detection and/or diagnosis of SARS-CoV-2 by FDA under an Emergency Use Authorization (EUA). This EUA will remain  in effect (meaning this test can be used) for the duration of the COVID-19 declaration under Section 564(b)(1) of the Act, 21 U.S.C.section 360bbb-3(b)(1), unless the authorization is terminated  or revoked sooner.       Influenza A by PCR NEGATIVE NEGATIVE Final   Influenza B by PCR NEGATIVE NEGATIVE Final    Comment: (NOTE) The Xpert Xpress SARS-CoV-2/FLU/RSV plus assay is intended as an aid in the diagnosis of influenza from Nasopharyngeal swab specimens and should not be used as a sole basis for treatment.  Nasal washings and aspirates are unacceptable for Xpert Xpress SARS-CoV-2/FLU/RSV testing.  Fact Sheet for Patients: EntrepreneurPulse.com.au  Fact Sheet for Healthcare Providers: IncredibleEmployment.be  This test is not yet approved or cleared by the [place] FDA and has been authorized for detection and/or diagnosis of SARS-CoV-2 by FDA under an Emergency Use Authorization (EUA). This EUA will remain in effect (meaning this test can be used) for the duration of the COVID-19 declaration under Section 564(b)(1) of the Act, 21 U.S.C. section 360bbb-3(b)(1), unless the authorization is terminated or revoked.  Performed at [facility], [facility] [address] [place], [place] [address]   Culture, body  fluid w Gram Stain-bottle     Status: None   Collection Time: 10/25/21 12:46 PM   Specimen: Peritoneal Washings  Result Value Ref Range Status   Specimen Description PERITONEAL  Final   Special Requests ABDOMEN  Final   Culture   Final    ABUNDANT CLOSTRIDIUM INNOCUUM CRITICAL RESULT CALLED TO, READ BACK BY AND VERIFIED WITH: RN DANIEL L [id number] FCP FEW BACTEROIDES OVATUS BETA LACTAMASE POSITIVE CRITICAL RESULT CALLED TO, READ BACK BY AND VERIFIED WITH: RN Earl Gala [id number] AT 34 BY CM Performed at [facility], [place] [address], [place], [place] [address]    Report Status 10/28/2021 FINAL  Final  Gram stain     Status: None   Collection Time: 10/25/21 12:46 PM   Specimen: Peritoneal Washings  Result Value Ref Range Status   Specimen Description PERITONEAL  Final   Special Requests PERITONEAL  Final   Gram Stain   Final    WBC PRESENT, PREDOMINANTLY MONONUCLEAR NO ORGANISMS SEEN CYTOSPIN SMEAR Performed at [facility], [place] [address], [place], [place] [address]    Report Status 10/26/2021 FINAL  Final         Radiology Studies: DG CHEST PORT 1 VIEW  Result Date: 10/27/2021 CLINICAL DATA:  Shortness of breath.  EXAM: PORTABLE CHEST 1 VIEW COMPARISON:  Chest x-ray 10/24/2021. FINDINGS: The heart size and mediastinal contours are within normal limits. Both lungs are clear. The visualized skeletal structures are unremarkable. IMPRESSION: No active disease. Electronically Signed   By: Ronney Asters M.D.   On: 10/27/2021 19:36        Scheduled Meds:  folic acid  1 mg Oral Daily   furosemide  20 mg Oral Daily   lactulose  20 g Oral BID   magnesium oxide  400 mg Oral Daily   multivitamin with minerals  1 tablet Oral Daily   nadolol  20 mg Oral Daily   pantoprazole  40 mg Oral Daily   rifaximin  550 mg Oral TID   sodium chloride flush  3 mL Intravenous Q12H   thiamine  100 mg Oral Daily   Or   thiamine  100 mg Intravenous Daily   Continuous Infusions:  cefTRIAXone (ROCEPHIN)  IV 1 g (10/28/21 0916)     LOS: 3 days        Shelly Coss, MD [facility] P1/27/2023, 11:20 AM

## 2021-10-29 DIAGNOSIS — K3189 Other diseases of stomach and duodenum: Secondary | ICD-10-CM | POA: Diagnosis not present

## 2021-10-29 DIAGNOSIS — D696 Thrombocytopenia, unspecified: Secondary | ICD-10-CM | POA: Diagnosis not present

## 2021-10-29 DIAGNOSIS — K7469 Other cirrhosis of liver: Secondary | ICD-10-CM | POA: Diagnosis not present

## 2021-10-29 DIAGNOSIS — R188 Other ascites: Secondary | ICD-10-CM | POA: Diagnosis not present

## 2021-10-29 DIAGNOSIS — Z7401 Bed confinement status: Secondary | ICD-10-CM | POA: Diagnosis not present

## 2021-10-29 DIAGNOSIS — R262 Difficulty in walking, not elsewhere classified: Secondary | ICD-10-CM | POA: Diagnosis not present

## 2021-10-29 DIAGNOSIS — D62 Acute posthemorrhagic anemia: Secondary | ICD-10-CM | POA: Diagnosis not present

## 2021-10-29 DIAGNOSIS — R279 Unspecified lack of coordination: Secondary | ICD-10-CM | POA: Diagnosis not present

## 2021-10-29 DIAGNOSIS — R0789 Other chest pain: Secondary | ICD-10-CM | POA: Diagnosis not present

## 2021-10-29 DIAGNOSIS — I8500 Esophageal varices without bleeding: Secondary | ICD-10-CM | POA: Diagnosis not present

## 2021-10-29 DIAGNOSIS — K5900 Constipation, unspecified: Secondary | ICD-10-CM | POA: Diagnosis not present

## 2021-10-29 DIAGNOSIS — L24A9 Irritant contact dermatitis due friction or contact with other specified body fluids: Secondary | ICD-10-CM | POA: Diagnosis not present

## 2021-10-29 DIAGNOSIS — K7031 Alcoholic cirrhosis of liver with ascites: Secondary | ICD-10-CM | POA: Diagnosis not present

## 2021-10-29 DIAGNOSIS — F1020 Alcohol dependence, uncomplicated: Secondary | ICD-10-CM | POA: Diagnosis not present

## 2021-10-29 DIAGNOSIS — D649 Anemia, unspecified: Secondary | ICD-10-CM | POA: Diagnosis not present

## 2021-10-29 DIAGNOSIS — R531 Weakness: Secondary | ICD-10-CM | POA: Diagnosis not present

## 2021-10-29 DIAGNOSIS — K921 Melena: Secondary | ICD-10-CM | POA: Diagnosis not present

## 2021-10-29 DIAGNOSIS — K7460 Unspecified cirrhosis of liver: Secondary | ICD-10-CM | POA: Diagnosis not present

## 2021-10-29 DIAGNOSIS — K7682 Hepatic encephalopathy: Secondary | ICD-10-CM | POA: Diagnosis not present

## 2021-10-29 DIAGNOSIS — K7030 Alcoholic cirrhosis of liver without ascites: Secondary | ICD-10-CM | POA: Diagnosis not present

## 2021-10-29 DIAGNOSIS — R109 Unspecified abdominal pain: Secondary | ICD-10-CM | POA: Diagnosis not present

## 2021-10-29 DIAGNOSIS — I700 Atherosclerosis of aorta: Secondary | ICD-10-CM | POA: Diagnosis not present

## 2021-10-29 DIAGNOSIS — R079 Chest pain, unspecified: Secondary | ICD-10-CM | POA: Diagnosis not present

## 2021-10-29 DIAGNOSIS — R5381 Other malaise: Secondary | ICD-10-CM | POA: Diagnosis not present

## 2021-10-29 DIAGNOSIS — F4320 Adjustment disorder, unspecified: Secondary | ICD-10-CM | POA: Diagnosis not present

## 2021-10-29 DIAGNOSIS — D500 Iron deficiency anemia secondary to blood loss (chronic): Secondary | ICD-10-CM | POA: Diagnosis not present

## 2021-10-29 DIAGNOSIS — I10 Essential (primary) hypertension: Secondary | ICD-10-CM | POA: Diagnosis not present

## 2021-10-29 DIAGNOSIS — K766 Portal hypertension: Secondary | ICD-10-CM | POA: Diagnosis not present

## 2021-10-29 DIAGNOSIS — R2681 Unsteadiness on feet: Secondary | ICD-10-CM | POA: Diagnosis not present

## 2021-10-29 DIAGNOSIS — I959 Hypotension, unspecified: Secondary | ICD-10-CM | POA: Diagnosis not present

## 2021-10-29 DIAGNOSIS — M6281 Muscle weakness (generalized): Secondary | ICD-10-CM | POA: Diagnosis not present

## 2021-10-29 DIAGNOSIS — K219 Gastro-esophageal reflux disease without esophagitis: Secondary | ICD-10-CM | POA: Diagnosis not present

## 2021-10-29 DIAGNOSIS — R970 Elevated carcinoembryonic antigen [CEA]: Secondary | ICD-10-CM | POA: Diagnosis not present

## 2021-10-29 DIAGNOSIS — I119 Hypertensive heart disease without heart failure: Secondary | ICD-10-CM | POA: Diagnosis not present

## 2021-10-29 DIAGNOSIS — N179 Acute kidney failure, unspecified: Secondary | ICD-10-CM | POA: Diagnosis not present

## 2021-10-29 MED ORDER — PANTOPRAZOLE SODIUM 40 MG PO TBEC: 40.0000 mg | DELAYED_RELEASE_TABLET | Freq: Every day | ORAL | Status: AC

## 2021-10-29 MED ORDER — ADULT MULTIVITAMIN W/MINERALS CH: 1.0000 | ORAL_TABLET | Freq: Every day | ORAL | Status: AC

## 2021-10-29 MED ORDER — THIAMINE HCL 100 MG PO TABS: 100.0000 mg | ORAL_TABLET | Freq: Every day | ORAL | Status: AC

## 2021-10-29 MED ORDER — FOLIC ACID 1 MG PO TABS: 1.0000 mg | ORAL_TABLET | Freq: Every day | ORAL | Status: AC

## 2021-10-29 MED ORDER — RIFAXIMIN 550 MG PO TABS: 550.0000 mg | ORAL_TABLET | Freq: Two times a day (BID) | ORAL | Status: AC

## 2021-10-29 MED ORDER — METRONIDAZOLE 500 MG PO TABS
500.0000 mg | ORAL_TABLET | Freq: Two times a day (BID) | ORAL | 0 refills | Status: AC
Start: 2021-10-29 — End: 2021-11-04

## 2021-10-29 MED ORDER — LACTULOSE 10 GM/15ML PO SOLN: 20.0000 g | Freq: Two times a day (BID) | ORAL | 0 refills | Status: AC

## 2021-10-29 MED ORDER — NADOLOL 20 MG PO TABS: 20.0000 mg | ORAL_TABLET | Freq: Every day | ORAL | Status: AC

## 2021-10-29 NOTE — Discharge Summary (Signed)
Physician Discharge Summary  [name]:[id number] DOB: 25-May-1941 DOA: 10/24/2021  PCP: Ronita Hipps, MD  Admit date: 10/24/2021 Discharge date: 10/29/2021  Admitted From: Home Disposition:  SNF  Discharge Condition:Stable CODE STATUS: DNR Diet recommendation: Regular   Brief/Interim Summary:  Patient is a 81 year old male with history of prostate cancer status postradiation seeding, hypertension, peripheral vascular disease who presented to the emergency room with complaints of fatigue, dark stools.  He had recent history of gastroenteritis and was having loose dark black stools, weakness, fatigue, poor appetite, bedbound for 2 days.Also reported bilateral lower extremity swelling, exertional dyspnea. He was taking Advil nightly, aspirin 81 mg.  On presentation he was tachycardic, blood pressure was stable.  Hemoglobin was 6.3, creatinine of 1.5, BNP of 210.  Patient was ordered a unit of blood transfusion.  FOBT was negative.  Started on Protonix infusion for suspicion of GI bleed.  GI consulted ,underwent EGD on 10/26/21, found to have nonbleeding esophageal varices, portal hypertensive changes.  Work-up completed.  PT/OT recommending skilled nursing facility on discharge.  Medically stable for discharge.  Following problems were addressed during his hospitalization:    Symptomatic anemia: Presented with fatigue, dark stools.   FOBT negative.  Hemoglobin was 6.3 on admission.  Transfused with 2 units of PRBC in total.  Hemoglobin in the range of 7-8. Continue monitoring as an outpatient   Suspected upper GI bleed: History of melena recently.  Suspicion for GI bleed, possible variceal bleeding on the background of cirrhosis.  GI consulted.  Started on ceftriaxone, octreotide.  Underwent EGD  with finding  non-bleeding esophageal varices and portal hypertensive gastropathy with punctate GAVE.  Started on nadolol for portal hypertension.  He completed ceftriaxone for total of 5 days.   Continue lactulose, rifaximin   Cirrhosis/ascites: History of heavy/moderate alcohol .still drinking.Ultrasound showed cirrhosis, ascites.   Underwent paracentesis with removal of 2.2 L of fluid.  Again underwent repeat paracentesis with removal of 1.2 L of fluid.  Fluid analysis from first paracentesis was not suggestive of SBP but culture grew Clostridium, bacteroids.  ID consulted, ID recommended to continue Flagyl for total of 1 week.  This organisms are not typical for SBP.  He does not have any signs of SBP.  He needs to follow-up with his gastroenterologist, Dr. Melina Copa as an outpatient. Ultrasound of the abdomen showed changes of cirrhosis.,associated splenomegaly and ascites.8 mm gallbladder wall polyp. No stones or evidence of acute cholecystitis. He takes Lasix 20 mg daily at home.  Exertional chest pain:   Described pain as sharp, pressure-like.  Currently chest pain-free.  Echo showed EF of 55 to 60%, no regional wall motion abnormality   Chronic alcohol abuse: Still drinking.  Continues to drink.  Motivated to stop but not interested in alcohol rehabilitation.  Continue thiamine, folic acid   AKI:   Last creatinine was 1.06 in 2017.  Creatinine pleatued at 1.4.  This could be his new baseline.  Check BMP in a week   Thrombocytopenia: Mild.  Continue to monitor.  Likely associated with cirrhosis, splenomegaly   Hypertension: Currently blood pressure stable.  Lisinopril stopped   Debility/deconditioning: PT/OT consulted.  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 Melina Copa , in 2 to 3 weeks.Call for appointment.   Increase activity slowly   Complete by: As directed       Allergies as of 10/29/2021       Reactions   Penicillins Anaphylaxis, Other (See Comments)   Tolerated Rocephin. Has patient had a PCN reaction causing immediate rash, facial/tongue/throat swelling, SOB or lightheadedness with hypotension: Yes Has patient had a PCN reaction causing severe rash involving mucus membranes or skin necrosis: No Has patient had a PCN reaction that required hospitalization No Has patient had a PCN reaction occurring within the last 10 years: No If all of the above answers are "NO", then may proceed with Cephalosporin use.        Medication List     STOP taking these medications    aspirin 81 MG EC tablet   lisinopril 10 MG tablet Commonly known as: ZESTRIL   Naproxen Sodium 220 MG Caps       TAKE these medications    Fish Oil 1000 MG Cpdr Take 1,000 mg by mouth daily.   folic acid 1 MG tablet Commonly known as: FOLVITE Take 1 tablet (1 mg total) by mouth daily. Start taking on: October 30, 2021   furosemide 20 MG tablet Commonly known as: LASIX TAKE 1 TABLET BY MOUTH DAILY. What changed: when to take this   lactulose 10 GM/15ML solution Commonly known as: CHRONULAC Take 30 mLs (20 g total) by mouth 2 (two) times daily.   metroNIDAZOLE 500 MG tablet Commonly known as: FLAGYL Take 1 tablet (500 mg total) by mouth every 12 (twelve) hours for 6 days.   MILK THISTLE PO Take 1 capsule by mouth daily.   multivitamin with minerals Tabs tablet Take 1 tablet by mouth daily. Start taking on: October 30, 2021   nadolol 20  MG tablet Commonly known as: CORGARD Take 1 tablet (20 mg total) by mouth daily. Start taking on: October 30, 2021   pantoprazole 40 MG tablet Commonly known as: PROTONIX Take 1 tablet (40 mg total) by mouth daily. Start taking on: October 30, 2021   rifaximin 550 MG Tabs tablet Commonly known as: XIFAXAN Take 1 tablet (550 mg total) by mouth 2 (two) times daily.   thiamine 100 MG tablet Take 1 tablet (100 mg total) by mouth daily. Start taking on: October 30, 2021        Contact information for follow-up providers     Nehemiah Settle, MD. Schedule an appointment as soon as possible for a visit.   Specialty: Gastroenterology Why: call for appointment to be seen within 3 to 6 weeks for follow up of liver problems. Contact information: [place] [address] [phone]              Contact information for after-discharge care     Destination     [facility] .   Service: Skilled Chiropodist information: [place] [phone]  Allergies  Allergen Reactions   Penicillins Anaphylaxis and Other (See Comments)    Tolerated Rocephin. Has patient had a PCN reaction causing immediate rash, facial/tongue/throat swelling, SOB or lightheadedness with hypotension: Yes Has patient had a PCN reaction causing severe rash involving mucus membranes or skin necrosis: No Has patient had a PCN reaction that required hospitalization No Has patient had a PCN reaction occurring within the last 10 years: No If all of the above answers are "NO", then may proceed with Cephalosporin use.    Consultations: GI,ID   Procedures/Studies: US Abdomen Complete  Result Date: 10/24/2021 CLINICAL DATA:  Abdominal distension EXAM: ABDOMEN ULTRASOUND COMPLETE COMPARISON:  04/10/2017 FINDINGS: Gallbladder: 8 mm echogenic non mobile nonshadowing focus compatible with polyp. No visible  stones. No wall thickening or sonographic Murphy sign. Common bile duct: Diameter: Normal caliber, 5 mm. Liver: Increased echotexture throughout the liver. Nodular contours. Findings compatible with cirrhosis. 8 mm cyst in the right hepatic lobe. No suspicious focal hepatic abnormality. Portal vein is patent on color Doppler imaging with normal direction of blood flow towards the liver. IVC: No abnormality visualized. Pancreas: Not well visualized due to overlying bowel gas. Spleen: Enlarged with a splenic volume of 1117 mL. Right Kidney: Length: 10.6 cm. Echogenicity within normal limits. No mass or hydronephrosis visualized. Left Kidney: Length: 10.0 cm. Echogenicity within normal limits. No mass or hydronephrosis visualized. Abdominal aorta: No aneurysm visualized. Other findings: Ascites noted in all 4 quadrants. IMPRESSION: Changes of cirrhosis.  Associated splenomegaly and ascites. 8 mm gallbladder wall polyp. No stones or evidence of acute cholecystitis. Electronically Signed   By: Rolm Baptise M.D.   On: 10/24/2021 21:20   DG CHEST PORT 1 VIEW  Result Date: 10/27/2021 CLINICAL DATA:  Shortness of breath. EXAM: PORTABLE CHEST 1 VIEW COMPARISON:  Chest x-ray 10/24/2021. FINDINGS: The heart size and mediastinal contours are within normal limits. Both lungs are clear. The visualized skeletal structures are unremarkable. IMPRESSION: No active disease. Electronically Signed   By: Ronney Asters M.D.   On: 10/27/2021 19:36   DG CHEST PORT 1 VIEW  Result Date: 10/24/2021 CLINICAL DATA:  Dyspnea and abdominal distension. EXAM: PORTABLE CHEST 1 VIEW COMPARISON:  02/25/2015 FINDINGS: Patient is rotated. The heart is normal in size. Normal mediastinal contours for technique. Minor left lung base atelectasis. No confluent airspace disease. No pulmonary edema, pleural effusion, or pneumothorax. No acute osseous abnormality. IMPRESSION: Minor left lung base atelectasis. Electronically Signed   By: Keith Rake  M.D.   On: 10/24/2021 20:14   ECHOCARDIOGRAM COMPLETE  Result Date: 10/25/2021    ECHOCARDIOGRAM REPORT   Patient Name:   Dustin Ramirez [facility] Date of Exam: 10/25/2021 Medical Rec #:  [id number]     Height:       71.0 in Accession #:    [id number]    Weight:       192.1 lb Date of Birth:  12/21/1940     BSA:          2.073 m Patient Age:    61 years      BP:           135/72 mmHg Patient Gender: M             HR:           90 bpm. Exam Location:  Inpatient Procedure: 2D Echo Indications:    Chest pain  History:        Patient has no prior history of Echocardiogram examinations.  Risk Factors:Hypertension.  Sonographer:    Arlyss Gandy Referring Phys: [id number] [place]  1. Left ventricular ejection fraction, by estimation, is 55 to 60%. The left ventricle has normal function. The left ventricle has no regional wall motion abnormalities. There is mild concentric left ventricular hypertrophy. Left ventricular diastolic parameters were normal.  2. Right ventricular systolic function is normal. The right ventricular size is normal. There is normal pulmonary artery systolic pressure.  3. The mitral valve is abnormal. No evidence of mitral valve regurgitation. No evidence of mitral stenosis.  4. The aortic valve was not well visualized. Aortic valve regurgitation is not visualized. No aortic stenosis is present.  5. The inferior vena cava is dilated in size with >50% respiratory variability, suggesting right atrial pressure of 8 mmHg. Comparison(s): No prior Echocardiogram. FINDINGS  Left Ventricle: Left ventricular ejection fraction, by estimation, is 55 to 60%. The left ventricle has normal function. The left ventricle has no regional wall motion abnormalities. The left ventricular internal cavity size was normal in size. There is  mild concentric left ventricular hypertrophy. Left ventricular diastolic parameters were normal. Right Ventricle: The right ventricular size is normal. No increase  in right ventricular wall thickness. Right ventricular systolic function is normal. There is normal pulmonary artery systolic pressure. The tricuspid regurgitant velocity is 2.29 m/s, and  with an assumed right atrial pressure of 8 mmHg, the estimated right ventricular systolic pressure is 47.4 mmHg. Left Atrium: Left atrial size was normal in size. Right Atrium: Right atrial size was normal in size. Pericardium: Trivial pericardial effusion is present. Mitral Valve: Calcified echoendensity on the leaflet tips most consistent with degenerative disease. The mitral valve is abnormal. No evidence of mitral valve regurgitation. No evidence of mitral valve stenosis. Tricuspid Valve: The tricuspid valve is normal in structure. Tricuspid valve regurgitation is not demonstrated. No evidence of tricuspid stenosis. Aortic Valve: The aortic valve was not well visualized. There is mild aortic valve annular calcification. Aortic valve regurgitation is not visualized. No aortic stenosis is present. Aortic valve mean gradient measures 4.0 mmHg. Aortic valve peak gradient measures 7.7 mmHg. Aortic valve area, by VTI measures 4.33 cm. Pulmonic Valve: The pulmonic valve was not well visualized. Pulmonic valve regurgitation is not visualized. No evidence of pulmonic stenosis. Aorta: The aortic root and ascending aorta are structurally normal, with no evidence of dilitation. Venous: The inferior vena cava is dilated in size with greater than 50% respiratory variability, suggesting right atrial pressure of 8 mmHg. IAS/Shunts: No atrial level shunt detected by color flow Doppler.  LEFT VENTRICLE PLAX 2D LVIDd:         4.20 cm   Diastology LVIDs:         3.00 cm   LV e' medial:    8.59 cm/s LV PW:         1.10 cm   LV E/e' medial:  12.6 LV IVS:        1.20 cm   LV e' lateral:   12.50 cm/s LVOT diam:     2.50 cm   LV E/e' lateral: 8.6 LV SV:         115 LV SV Index:   55 LVOT Area:     4.91 cm  RIGHT VENTRICLE             IVC RV Basal  diam:  4.10 cm     IVC diam: 2.50 cm RV Mid diam:    3.10 cm RV S prime:  11.00 cm/s TAPSE (M-mode): 2.5 cm LEFT ATRIUM             Index        RIGHT ATRIUM           Index LA diam:        4.00 cm 1.93 cm/m   RA Area:     18.20 cm LA Vol (A2C):   54.0 ml 26.05 ml/m  RA Volume:   49.60 ml  23.93 ml/m LA Vol (A4C):   90.0 ml 43.42 ml/m LA Biplane Vol: 69.3 ml 33.43 ml/m  AORTIC VALVE AV Area (Vmax):    3.88 cm AV Area (Vmean):   3.87 cm AV Area (VTI):     4.33 cm AV Vmax:           139.00 cm/s AV Vmean:          97.900 cm/s AV VTI:            0.265 m AV Peak Grad:      7.7 mmHg AV Mean Grad:      4.0 mmHg LVOT Vmax:         110.00 cm/s LVOT Vmean:        77.100 cm/s LVOT VTI:          0.234 m LVOT/AV VTI ratio: 0.88  AORTA Ao Root diam: 3.50 cm Ao Asc diam:  3.40 cm MITRAL VALVE                TRICUSPID VALVE MV Area (PHT): 3.68 cm     TR Peak grad:   21.0 mmHg MV Decel Time: 206 msec     TR Vmax:        229.00 cm/s MV E velocity: 108.00 cm/s MV A velocity: 78.40 cm/s   SHUNTS MV E/A ratio:  1.38         Systemic VTI:  0.23 m                             Systemic Diam: 2.50 cm Rudean Haskell MD Electronically signed by Rudean Haskell MD Signature Date/Time: 10/25/2021/2:47:21 PM    Final    IR Paracentesis  Result Date: 10/28/2021 INDICATION: Patient with history of alcohol abuse, prostate cancer. Request for therapeutic paracentesis. EXAM: ULTRASOUND GUIDED THERAPEUTIC RIGHT LOWER QUADRANT PARACENTESIS MEDICATIONS: 10 mL 1 % lidocaine COMPLICATIONS: None immediate. PROCEDURE: Informed written consent was obtained from the patient after a discussion of the risks, benefits and alternatives to treatment. A timeout was performed prior to the initiation of the procedure. Initial ultrasound scanning demonstrates a moderate amount of ascites within the right lower abdominal quadrant. The right lower abdomen was prepped and draped in the usual sterile fashion. 1% lidocaine was used for local  anesthesia. Following this, a 19 gauge, 10-cm, Yueh catheter was introduced. An ultrasound image was saved for documentation purposes. The paracentesis was performed. The catheter was removed and a dressing was applied. The patient tolerated the procedure well without immediate post procedural complication. FINDINGS: A total of approximately 1.2 L of clear, yellow fluid was removed. IMPRESSION: Successful ultrasound-guided paracentesis yielding 1.2 liters of peritoneal fluid. Read by: Narda Rutherford, AGNP-BC Electronically Signed   By: Albin Felling M.D.   On: 10/28/2021 17:13   IR Paracentesis  Result Date: 10/25/2021 INDICATION: Patient with history of alcohol abuse, prostate cancer. Presented to the ED with abdominal distension. Found to have ascites. Request is for therapeutic and diagnostic  paracentesis EXAM: ULTRASOUND GUIDED diagnostic and therapeutic PARACENTESIS MEDICATIONS: Lidocaine 1% 10 mL COMPLICATIONS: None immediate. PROCEDURE: Informed written consent was obtained from the patient after a discussion of the risks, benefits and alternatives to treatment. A timeout was performed prior to the initiation of the procedure. Initial ultrasound scanning demonstrates a small amount of ascites within the left lower abdominal quadrant. The left lower abdomen was prepped and draped in the usual sterile fashion. 1% lidocaine was used for local anesthesia. Following this, a 19 gauge, 10-cm, Yueh catheter was introduced. An ultrasound image was saved for documentation purposes. The paracentesis was performed. The catheter was removed and a dressing was applied. The patient tolerated the procedure well without immediate post procedural complication. FINDINGS: A total of approximately 2.2 L of straw-colored fluid was removed. Samples were sent to the laboratory as requested by the clinical team. IMPRESSION: Successful ultrasound-guided diagnostic and therapeutic paracentesis yielding 2.2 liters of peritoneal fluid.  Read by: Rushie Nyhan, NP Electronically Signed   By: Corrie Mckusick D.O.   On: 10/25/2021 15:36      Subjective: Patient seen and examined at the bedside this morning.  Hemodynamically stable for discharge today.  Discharge Exam: Vitals:   10/29/21 0715 10/29/21 0753  BP:  (!) 129/51  Pulse:  73  Resp:  16  Temp:  98.3 F (36.8 C)  SpO2: 100% 98%   Vitals:   10/29/21 0417 10/29/21 0500 10/29/21 0715 10/29/21 0753  BP: (!) 141/60   (!) 129/51  Pulse: 75   73  Resp:    16  Temp: 98.9 F (37.2 C)   98.3 F (36.8 C)  TempSrc: Oral   Oral  SpO2: 97%  100% 98%  Weight:  85.6 kg    Height:        General: Pt is alert, awake, not in acute distress, weak Cardiovascular: RRR, S1/S2 +, no rubs, no gallops Respiratory: CTA bilaterally, no wheezing, no rhonchi Abdominal: Soft, NT, mildly distended abdomen, bowel sounds + Extremities: no edema, no cyanosis    The results of significant diagnostics from this hospitalization (including imaging, microbiology, ancillary and laboratory) are listed below for reference.     Microbiology: Recent Results (from the past 240 hour(s))  Resp Panel by RT-PCR (Flu A&B, Covid) Nasopharyngeal Swab     Status: None   Collection Time: 10/24/21 12:32 PM   Specimen: Nasopharyngeal Swab; Nasopharyngeal(NP) swabs in vial transport medium  Result Value Ref Range Status   SARS Coronavirus 2 by RT PCR NEGATIVE NEGATIVE Final    Comment: (NOTE) SARS-CoV-2 target nucleic acids are NOT DETECTED.  The SARS-CoV-2 RNA is generally detectable in upper respiratory specimens during the acute phase of infection. The lowest concentration of SARS-CoV-2 viral copies this assay can detect is 138 copies/mL. A negative result does not preclude SARS-Cov-2 infection and should not be used as the sole basis for treatment or other patient management decisions. A negative result may occur with  improper specimen collection/handling, submission of specimen  other than nasopharyngeal swab, presence of viral mutation(s) within the areas targeted by this assay, and inadequate number of viral copies(<138 copies/mL). A negative result must be combined with clinical observations, patient history, and epidemiological information. The expected result is Negative.  Fact Sheet for Patients:  EntrepreneurPulse.com.au  Fact Sheet for Healthcare Providers:  IncredibleEmployment.be  This test is no t yet approved or cleared by the [place] FDA and  has been authorized for detection and/or diagnosis of SARS-CoV-2 by FDA under an  Emergency Use Authorization (EUA). This EUA will remain  in effect (meaning this test can be used) for the duration of the COVID-19 declaration under Section 564(b)(1) of the Act, 21 U.S.C.section 360bbb-3(b)(1), unless the authorization is terminated  or revoked sooner.       Influenza A by PCR NEGATIVE NEGATIVE Final   Influenza B by PCR NEGATIVE NEGATIVE Final    Comment: (NOTE) The Xpert Xpress SARS-CoV-2/FLU/RSV plus assay is intended as an aid in the diagnosis of influenza from Nasopharyngeal swab specimens and should not be used as a sole basis for treatment. Nasal washings and aspirates are unacceptable for Xpert Xpress SARS-CoV-2/FLU/RSV testing.  Fact Sheet for Patients: EntrepreneurPulse.com.au  Fact Sheet for Healthcare Providers: IncredibleEmployment.be  This test is not yet approved or cleared by the [place] FDA and has been authorized for detection and/or diagnosis of SARS-CoV-2 by FDA under an Emergency Use Authorization (EUA). This EUA will remain in effect (meaning this test can be used) for the duration of the COVID-19 declaration under Section 564(b)(1) of the Act, 21 U.S.C. section 360bbb-3(b)(1), unless the authorization is terminated or revoked.  Performed at [facility], [place] [address], [place],  [place] [address]   Culture, body fluid w Gram Stain-bottle     Status: None   Collection Time: 10/25/21 12:46 PM   Specimen: Peritoneal Washings  Result Value Ref Range Status   Specimen Description PERITONEAL  Final   Special Requests ABDOMEN  Final   Culture   Final    ABUNDANT CLOSTRIDIUM INNOCUUM CRITICAL RESULT CALLED TO, READ BACK BY AND VERIFIED WITH: RN DANIEL L [id number] FCP FEW BACTEROIDES OVATUS BETA LACTAMASE POSITIVE CRITICAL RESULT CALLED TO, READ BACK BY AND VERIFIED WITH: RN Earl Gala [id number] AT 43 BY CM Performed at [facility], [place] [address], [place], [place] [address]    Report Status 10/28/2021 FINAL  Final  Gram stain     Status: None   Collection Time: 10/25/21 12:46 PM   Specimen: Peritoneal Washings  Result Value Ref Range Status   Specimen Description PERITONEAL  Final   Special Requests PERITONEAL  Final   Gram Stain   Final    WBC PRESENT, PREDOMINANTLY MONONUCLEAR NO ORGANISMS SEEN CYTOSPIN SMEAR Performed at [facility], [place] [address] [place],  [address]    Report Status 10/26/2021 FINAL  Final  Resp Panel by RT-PCR (Flu A&B, Covid) Nasopharyngeal Swab     Status: None   Collection Time: 10/28/21  1:59 PM   Specimen: Nasopharyngeal Swab; Nasopharyngeal(NP) swabs in vial transport medium  Result Value Ref Range Status   SARS Coronavirus 2 by RT PCR NEGATIVE NEGATIVE Final    Comment: (NOTE) SARS-CoV-2 target nucleic acids are NOT DETECTED.  The SARS-CoV-2 RNA is generally detectable in upper respiratory specimens during the acute phase of infection. The lowest concentration of SARS-CoV-2 viral copies this assay can detect is 138 copies/mL. A negative result does not preclude SARS-Cov-2 infection and should not be used as the sole basis for treatment or other patient management decisions. A negative result may occur with  improper specimen collection/handling, submission of specimen other than nasopharyngeal swab, presence of  viral mutation(s) within the areas targeted by this assay, and inadequate number of viral copies(<138 copies/mL). A negative result must be combined with clinical observations, patient history, and epidemiological information. The expected result is Negative.  Fact Sheet for Patients:  EntrepreneurPulse.com.au  Fact Sheet for Healthcare Providers:  IncredibleEmployment.be  This test  is no t yet approved or cleared by the [place] and  has been authorized for detection and/or diagnosis of SARS-CoV-2 by FDA under an Emergency Use Authorization (EUA). This EUA will remain  in effect (meaning this test can be used) for the duration of the COVID-19 declaration under Section 564(b)(1) of the Act, 21 U.S.C.section 360bbb-3(b)(1), unless the authorization is terminated  or revoked sooner.       Influenza A by PCR NEGATIVE NEGATIVE Final   Influenza B by PCR NEGATIVE NEGATIVE Final    Comment: (NOTE) The Xpert Xpress SARS-CoV-2/FLU/RSV plus assay is intended as an aid in the diagnosis of influenza from Nasopharyngeal swab specimens and should not be used as a sole basis for treatment. Nasal washings and aspirates are unacceptable for Xpert Xpress SARS-CoV-2/FLU/RSV testing.  Fact Sheet for Patients: EntrepreneurPulse.com.au  Fact Sheet for Healthcare Providers: IncredibleEmployment.be  This test is not yet approved or cleared by the [place] FDA and has been authorized for detection and/or diagnosis of SARS-CoV-2 by FDA under an Emergency Use Authorization (EUA). This EUA will remain in effect (meaning this test can be used) for the duration of the COVID-19 declaration under Section 564(b)(1) of the Act, 21 U.S.C. section 360bbb-3(b)(1), unless the authorization is terminated or revoked.  Performed at [facility], [place] [address], [place], [place] [address]      Labs: BNP (last 3  results) Recent Labs    10/24/21 1521  BNP 540.0*   Basic Metabolic Panel: Recent Labs  Lab 10/24/21 1257 10/25/21 0455 10/26/21 0539 10/27/21 0652 10/28/21 0137  NA 139 140 135 136 135  K 4.1 4.6 4.4 4.0 4.4  CL 107 111 105 107 105  CO2 22 22 22 24 24   GLUCOSE 229* 133* 152* 143* 143*  BUN 18 16 16 15 15   CREATININE 1.53* 1.44* 1.57* 1.41* 1.42*  CALCIUM 9.2 9.0 9.0 9.0 9.2  MG  --  1.6* 1.7  --   --    Liver Function Tests: Recent Labs  Lab 10/24/21 1417 10/25/21 0455  AST 38 32  ALT 20 17  ALKPHOS 68 61  BILITOT 0.8 3.2*  PROT 7.0 6.4*  ALBUMIN 2.6* 2.4*   No results for input(s): LIPASE, AMYLASE in the last 168 hours. No results for input(s): AMMONIA in the last 168 hours. CBC: Recent Labs  Lab 10/24/21 1257 10/24/21 2335 10/25/21 0455 10/26/21 0539 10/27/21 0652 10/28/21 0137  WBC 4.4  --  3.7* 3.7* 3.6* 6.5  NEUTROABS 2.8  --   --  2.3 2.3 4.6  HGB 6.3* 6.5* 7.3* 7.1* 7.2* 8.1*  HCT 21.6* 21.1* 24.5* 22.8* 23.4* 27.1*  MCV 86.7  --  88.8 85.1 85.7 86.3  PLT 112*  --  93* 89* 90* 119*   Cardiac Enzymes: No results for input(s): CKTOTAL, CKMB, CKMBINDEX, TROPONINI in the last 168 hours. BNP: Invalid input(s): POCBNP CBG: No results for input(s): GLUCAP in the last 168 hours. D-Dimer No results for input(s): DDIMER in the last 72 hours. Hgb A1c No results for input(s): HGBA1C in the last 72 hours. Lipid Profile No results for input(s): CHOL, HDL, LDLCALC, TRIG, CHOLHDL, LDLDIRECT in the last 72 hours. Thyroid function studies No results for input(s): TSH, T4TOTAL, T3FREE, THYROIDAB in the last 72 hours.  Invalid input(s): FREET3 Anemia work up No results for input(s): VITAMINB12, FOLATE, FERRITIN, TIBC, IRON, RETICCTPCT in the last 72 hours. Urinalysis    Component Value Date/Time   COLORURINE YELLOW 05/16/2016 2359  APPEARANCEUR CLOUDY (A) 05/16/2016 2359   LABSPEC 1.011 05/16/2016 2359   PHURINE 5.5 05/16/2016 2359   GLUCOSEU  NEGATIVE 05/16/2016 2359   HGBUR LARGE (A) 05/16/2016 2359   BILIRUBINUR NEGATIVE 05/16/2016 2359   KETONESUR NEGATIVE 05/16/2016 2359   PROTEINUR 30 (A) 05/16/2016 2359   NITRITE NEGATIVE 05/16/2016 2359   LEUKOCYTESUR SMALL (A) 05/16/2016 2359   Sepsis Labs Invalid input(s): PROCALCITONIN,  WBC,  LACTICIDVEN Microbiology Recent Results (from the past 240 hour(s))  Resp Panel by RT-PCR (Flu A&B, Covid) Nasopharyngeal Swab     Status: None   Collection Time: 10/24/21 12:32 PM   Specimen: Nasopharyngeal Swab; Nasopharyngeal(NP) swabs in vial transport medium  Result Value Ref Range Status   SARS Coronavirus 2 by RT PCR NEGATIVE NEGATIVE Final    Comment: (NOTE) SARS-CoV-2 target nucleic acids are NOT DETECTED.  The SARS-CoV-2 RNA is generally detectable in upper respiratory specimens during the acute phase of infection. The lowest concentration of SARS-CoV-2 viral copies this assay can detect is 138 copies/mL. A negative result does not preclude SARS-Cov-2 infection and should not be used as the sole basis for treatment or other patient management decisions. A negative result may occur with  improper specimen collection/handling, submission of specimen other than nasopharyngeal swab, presence of viral mutation(s) within the areas targeted by this assay, and inadequate number of viral copies(<138 copies/mL). A negative result must be combined with clinical observations, patient history, and epidemiological information. The expected result is Negative.  Fact Sheet for Patients:  EntrepreneurPulse.com.au  Fact Sheet for Healthcare Providers:  IncredibleEmployment.be  This test is no t yet approved or cleared by the [place] FDA and  has been authorized for detection and/or diagnosis of SARS-CoV-2 by FDA under an Emergency Use Authorization (EUA). This EUA will remain  in effect (meaning this test can be used) for the duration of  the COVID-19 declaration under Section 564(b)(1) of the Act, 21 U.S.C.section 360bbb-3(b)(1), unless the authorization is terminated  or revoked sooner.       Influenza A by PCR NEGATIVE NEGATIVE Final   Influenza B by PCR NEGATIVE NEGATIVE Final    Comment: (NOTE) The Xpert Xpress SARS-CoV-2/FLU/RSV plus assay is intended as an aid in the diagnosis of influenza from Nasopharyngeal swab specimens and should not be used as a sole basis for treatment. Nasal washings and aspirates are unacceptable for Xpert Xpress SARS-CoV-2/FLU/RSV testing.  Fact Sheet for Patients: EntrepreneurPulse.com.au  Fact Sheet for Healthcare Providers: IncredibleEmployment.be  This test is not yet approved or cleared by the [place] FDA and has been authorized for detection and/or diagnosis of SARS-CoV-2 by FDA under an Emergency Use Authorization (EUA). This EUA will remain in effect (meaning this test can be used) for the duration of the COVID-19 declaration under Section 564(b)(1) of the Act, 21 U.S.C. section 360bbb-3(b)(1), unless the authorization is terminated or revoked.  Performed at [facility], [place] [address] [place], [place] [address]   Culture, body fluid w Gram Stain-bottle     Status: None   Collection Time: 10/25/21 12:46 PM   Specimen: Peritoneal Washings  Result Value Ref Range Status   Specimen Description PERITONEAL  Final   Special Requests ABDOMEN  Final   Culture   Final    ABUNDANT CLOSTRIDIUM INNOCUUM CRITICAL RESULT CALLED TO, READ BACK BY AND VERIFIED WITH: RN DANIEL L [phone] FCP FEW BACTEROIDES OVATUS BETA LACTAMASE POSITIVE CRITICAL RESULT CALLED TO, READ BACK BY AND VERIFIED WITH: RN Earl Gala [phone] AT  1110 BY CM Performed at [facility], [place] [address] [place], [place] [address]    Report Status 10/28/2021 FINAL  Final  Gram stain     Status: None   Collection Time: 10/25/21 12:46 PM   Specimen:  Peritoneal Washings  Result Value Ref Range Status   Specimen Description PERITONEAL  Final   Special Requests PERITONEAL  Final   Gram Stain   Final    WBC PRESENT, PREDOMINANTLY MONONUCLEAR NO ORGANISMS SEEN CYTOSPIN SMEAR Performed at [facility], [place] [address] [place], [place] [address]    Report Status 10/26/2021 FINAL  Final  Resp Panel by RT-PCR (Flu A&B, Covid) Nasopharyngeal Swab     Status: None   Collection Time: 10/28/21  1:59 PM   Specimen: Nasopharyngeal Swab; Nasopharyngeal(NP) swabs in vial transport medium  Result Value Ref Range Status   SARS Coronavirus 2 by RT PCR NEGATIVE NEGATIVE Final    Comment: (NOTE) SARS-CoV-2 target nucleic acids are NOT DETECTED.  The SARS-CoV-2 RNA is generally detectable in upper respiratory specimens during the acute phase of infection. The lowest concentration of SARS-CoV-2 viral copies this assay can detect is 138 copies/mL. A negative result does not preclude SARS-Cov-2 infection and should not be used as the sole basis for treatment or other patient management decisions. A negative result may occur with  improper specimen collection/handling, submission of specimen other than nasopharyngeal swab, presence of viral mutation(s) within the areas targeted by this assay, and inadequate number of viral copies(<138 copies/mL). A negative result must be combined with clinical observations, patient history, and epidemiological information. The expected result is Negative.  Fact Sheet for Patients:  EntrepreneurPulse.com.au  Fact Sheet for Healthcare Providers:  IncredibleEmployment.be  This test is no t yet approved or cleared by the [place] FDA and  has been authorized for detection and/or diagnosis of SARS-CoV-2 by FDA under an Emergency Use Authorization (EUA). This EUA will remain  in effect (meaning this test can be used) for the duration of the COVID-19 declaration under  Section 564(b)(1) of the Act, 21 U.S.C.section 360bbb-3(b)(1), unless the authorization is terminated  or revoked sooner.       Influenza A by PCR NEGATIVE NEGATIVE Final   Influenza B by PCR NEGATIVE NEGATIVE Final    Comment: (NOTE) The Xpert Xpress SARS-CoV-2/FLU/RSV plus assay is intended as an aid in the diagnosis of influenza from Nasopharyngeal swab specimens and should not be used as a sole basis for treatment. Nasal washings and aspirates are unacceptable for Xpert Xpress SARS-CoV-2/FLU/RSV testing.  Fact Sheet for Patients: EntrepreneurPulse.com.au  Fact Sheet for Healthcare Providers: IncredibleEmployment.be  This test is not yet approved or cleared by the [place] FDA and has been authorized for detection and/or diagnosis of SARS-CoV-2 by FDA under an Emergency Use Authorization (EUA). This EUA will remain in effect (meaning this test can be used) for the duration of the COVID-19 declaration under Section 564(b)(1) of the Act, 21 U.S.C. section 360bbb-3(b)(1), unless the authorization is terminated or revoked.  Performed at [facility], [place] [address] [place], [place] [address]     Please note: You were cared for by a hospitalist during your hospital stay. Once you are discharged, your primary care physician will handle any further medical issues. Please note that NO REFILLS for any discharge medications will be authorized once you are discharged, as it is imperative that you return to your primary care physician (or establish a relationship with a primary care  physician if you do not have one) for your post hospital discharge needs so that they can reassess your need for medications and monitor your lab values.    Time coordinating discharge: 40 minutes  SIGNED:   Shelly Coss, MD  [facility] 10/29/2021, 10:28 AM Pager [phone]  If 7PM-7AM, please contact night-coverage [email or web address] Password  [email or web address]

## 2021-10-29 NOTE — TOC Transition Note (Signed)
Transition of Care [facility]) - CM/SW Discharge Note   Patient Details  Name: Dustin Ramirez MRN: [id number] Date of Birth: 03-Feb-1941  [facility] CM/SW Contact:  Emeterio Reeve, LCSW Phone Number: 10/29/2021, 12:07 PM   Clinical Narrative:     Per MD patient ready for DC to [facility]. RN, patient, patient's family, and facility notified of DC. Discharge Summary and FL2 sent to facility. DC packet on chart. Insurance Josem Kaufmann has been received and pt is covid negative. Ambulance transport requested for patient.    RN to call report to [phone]. Pt will go to room 701.  CSW will sign off for now as social work intervention is no longer needed. Please consult [place] again if new needs arise.   Final next level of care: Skilled Nursing Facility Barriers to Discharge: Barriers Resolved   Patient Goals and CMS Choice Patient states their goals for this hospitalization and ongoing recovery are:: be more independent CMS Medicare.gov Compare Post Acute Care list provided to:: Patient Represenative (must comment) Choice offered to / list presented to : Adult Children  Discharge Placement              Patient chooses bed at: [facility], [facility] Patient to be transferred to facility by: [facility] Name of family member notified: [place], [place] Patient and family notified of of transfer: 10/29/21  Discharge Plan and Services In-house Referral: Clinical Social Work   Post Acute Care Choice: [place]                               Social Determinants of Health (SDOH) Interventions     Readmission Risk Interventions No flowsheet data found.

## 2021-10-31 ENCOUNTER — Ambulatory Visit (HOSPITAL_COMMUNITY)
Admission: RE | Admit: 2021-10-31 | Payer: Medicare HMO | Source: Ambulatory Visit | Attending: Sports Medicine | Admitting: Sports Medicine

## 2021-10-31 DIAGNOSIS — R262 Difficulty in walking, not elsewhere classified: Secondary | ICD-10-CM | POA: Diagnosis not present

## 2021-10-31 DIAGNOSIS — K7469 Other cirrhosis of liver: Secondary | ICD-10-CM | POA: Diagnosis not present

## 2021-10-31 DIAGNOSIS — D62 Acute posthemorrhagic anemia: Secondary | ICD-10-CM | POA: Diagnosis not present

## 2021-10-31 DIAGNOSIS — K921 Melena: Secondary | ICD-10-CM | POA: Diagnosis not present

## 2021-11-03 ENCOUNTER — Inpatient Hospital Stay (HOSPITAL_COMMUNITY): Admission: RE | Admit: 2021-11-03 | Payer: Medicare HMO | Source: Ambulatory Visit

## 2021-11-03 DIAGNOSIS — R188 Other ascites: Secondary | ICD-10-CM | POA: Diagnosis not present

## 2021-11-03 DIAGNOSIS — K921 Melena: Secondary | ICD-10-CM | POA: Diagnosis not present

## 2021-11-03 DIAGNOSIS — K3189 Other diseases of stomach and duodenum: Secondary | ICD-10-CM | POA: Diagnosis not present

## 2021-11-03 DIAGNOSIS — K766 Portal hypertension: Secondary | ICD-10-CM | POA: Diagnosis not present

## 2021-11-03 DIAGNOSIS — K7682 Hepatic encephalopathy: Secondary | ICD-10-CM | POA: Diagnosis not present

## 2021-11-03 DIAGNOSIS — K7030 Alcoholic cirrhosis of liver without ascites: Secondary | ICD-10-CM | POA: Diagnosis not present

## 2021-11-04 DIAGNOSIS — I10 Essential (primary) hypertension: Secondary | ICD-10-CM | POA: Diagnosis not present

## 2021-11-05 DIAGNOSIS — D500 Iron deficiency anemia secondary to blood loss (chronic): Secondary | ICD-10-CM | POA: Diagnosis not present

## 2021-11-07 DIAGNOSIS — D649 Anemia, unspecified: Secondary | ICD-10-CM | POA: Diagnosis not present

## 2021-11-07 DIAGNOSIS — N179 Acute kidney failure, unspecified: Secondary | ICD-10-CM | POA: Diagnosis not present

## 2021-11-11 DIAGNOSIS — K7030 Alcoholic cirrhosis of liver without ascites: Secondary | ICD-10-CM | POA: Diagnosis not present

## 2021-11-11 DIAGNOSIS — R109 Unspecified abdominal pain: Secondary | ICD-10-CM | POA: Diagnosis not present

## 2021-11-11 DIAGNOSIS — I700 Atherosclerosis of aorta: Secondary | ICD-10-CM | POA: Diagnosis not present

## 2021-11-17 DIAGNOSIS — K219 Gastro-esophageal reflux disease without esophagitis: Secondary | ICD-10-CM | POA: Diagnosis not present

## 2021-11-17 DIAGNOSIS — D649 Anemia, unspecified: Secondary | ICD-10-CM | POA: Diagnosis not present

## 2021-11-17 DIAGNOSIS — K7031 Alcoholic cirrhosis of liver with ascites: Secondary | ICD-10-CM | POA: Diagnosis not present

## 2021-11-17 DIAGNOSIS — F4320 Adjustment disorder, unspecified: Secondary | ICD-10-CM | POA: Diagnosis not present

## 2021-11-17 DIAGNOSIS — K7460 Unspecified cirrhosis of liver: Secondary | ICD-10-CM | POA: Diagnosis not present

## 2021-11-17 DIAGNOSIS — I8500 Esophageal varices without bleeding: Secondary | ICD-10-CM | POA: Diagnosis not present

## 2021-11-17 DIAGNOSIS — I10 Essential (primary) hypertension: Secondary | ICD-10-CM | POA: Diagnosis not present

## 2021-11-17 DIAGNOSIS — K5900 Constipation, unspecified: Secondary | ICD-10-CM | POA: Diagnosis not present

## 2021-11-25 DIAGNOSIS — R970 Elevated carcinoembryonic antigen [CEA]: Secondary | ICD-10-CM | POA: Diagnosis not present

## 2021-11-29 DIAGNOSIS — K7469 Other cirrhosis of liver: Secondary | ICD-10-CM | POA: Diagnosis not present

## 2021-11-29 DIAGNOSIS — K219 Gastro-esophageal reflux disease without esophagitis: Secondary | ICD-10-CM | POA: Diagnosis not present

## 2021-11-29 DIAGNOSIS — R5381 Other malaise: Secondary | ICD-10-CM | POA: Diagnosis not present

## 2021-11-29 DIAGNOSIS — I119 Hypertensive heart disease without heart failure: Secondary | ICD-10-CM | POA: Diagnosis not present

## 2021-11-30 DIAGNOSIS — L24A9 Irritant contact dermatitis due friction or contact with other specified body fluids: Secondary | ICD-10-CM | POA: Diagnosis not present

## 2021-12-01 DIAGNOSIS — D500 Iron deficiency anemia secondary to blood loss (chronic): Secondary | ICD-10-CM | POA: Diagnosis not present

## 2021-12-02 DIAGNOSIS — D500 Iron deficiency anemia secondary to blood loss (chronic): Secondary | ICD-10-CM | POA: Diagnosis not present

## 2021-12-05 DIAGNOSIS — D649 Anemia, unspecified: Secondary | ICD-10-CM | POA: Diagnosis not present

## 2021-12-07 DIAGNOSIS — L24A9 Irritant contact dermatitis due friction or contact with other specified body fluids: Secondary | ICD-10-CM | POA: Diagnosis not present

## 2021-12-07 DIAGNOSIS — D500 Iron deficiency anemia secondary to blood loss (chronic): Secondary | ICD-10-CM | POA: Diagnosis not present

## 2021-12-07 DIAGNOSIS — F1020 Alcohol dependence, uncomplicated: Secondary | ICD-10-CM | POA: Diagnosis not present

## 2021-12-14 ENCOUNTER — Telehealth: Payer: Self-pay | Admitting: Gastroenterology

## 2021-12-14 DIAGNOSIS — L24A9 Irritant contact dermatitis due friction or contact with other specified body fluids: Secondary | ICD-10-CM | POA: Diagnosis not present

## 2021-12-14 NOTE — Telephone Encounter (Signed)
Hi Dr. Lyndel Safe, ? ?We received a referral for patient to be seen for cirrhosis and GI bleed. Patient is currently with Dr. Melina Copa. Spoke with the patient's daughter, she states Dr. Melina Copa will soon retire and the patient is currently having issues with fluid in his stomach along with anemia. Seeking to transfer care over to you to continue treatment. Records are available through care everywhere. Also have records for you to review and advise on scheduling.  ? ?Thank you ?

## 2021-12-15 DIAGNOSIS — K7030 Alcoholic cirrhosis of liver without ascites: Secondary | ICD-10-CM | POA: Diagnosis not present

## 2021-12-15 DIAGNOSIS — K766 Portal hypertension: Secondary | ICD-10-CM | POA: Diagnosis not present

## 2021-12-15 DIAGNOSIS — R188 Other ascites: Secondary | ICD-10-CM | POA: Diagnosis not present

## 2021-12-15 DIAGNOSIS — K3189 Other diseases of stomach and duodenum: Secondary | ICD-10-CM | POA: Diagnosis not present

## 2021-12-15 DIAGNOSIS — K7682 Hepatic encephalopathy: Secondary | ICD-10-CM | POA: Diagnosis not present

## 2021-12-15 DIAGNOSIS — D500 Iron deficiency anemia secondary to blood loss (chronic): Secondary | ICD-10-CM | POA: Diagnosis not present

## 2021-12-15 DIAGNOSIS — I8500 Esophageal varices without bleeding: Secondary | ICD-10-CM | POA: Diagnosis not present

## 2021-12-22 DIAGNOSIS — Z1331 Encounter for screening for depression: Secondary | ICD-10-CM | POA: Diagnosis not present

## 2021-12-22 DIAGNOSIS — Z09 Encounter for follow-up examination after completed treatment for conditions other than malignant neoplasm: Secondary | ICD-10-CM | POA: Diagnosis not present

## 2021-12-22 DIAGNOSIS — Z0000 Encounter for general adult medical examination without abnormal findings: Secondary | ICD-10-CM | POA: Diagnosis not present

## 2021-12-22 DIAGNOSIS — Z6822 Body mass index (BMI) 22.0-22.9, adult: Secondary | ICD-10-CM | POA: Diagnosis not present

## 2021-12-22 DIAGNOSIS — M533 Sacrococcygeal disorders, not elsewhere classified: Secondary | ICD-10-CM | POA: Diagnosis not present

## 2021-12-22 DIAGNOSIS — K7460 Unspecified cirrhosis of liver: Secondary | ICD-10-CM | POA: Diagnosis not present

## 2021-12-30 DIAGNOSIS — L89312 Pressure ulcer of right buttock, stage 2: Secondary | ICD-10-CM | POA: Diagnosis not present

## 2021-12-30 DIAGNOSIS — D62 Acute posthemorrhagic anemia: Secondary | ICD-10-CM | POA: Diagnosis not present

## 2022-01-02 NOTE — Telephone Encounter (Addendum)
LVM for patient to call back. ? ?Patient needs to follow up care with Dr Melina Copa. He can not transfer care to Dr Lyndel Safe  ?

## 2022-01-10 DIAGNOSIS — D649 Anemia, unspecified: Secondary | ICD-10-CM | POA: Diagnosis not present

## 2022-01-16 DIAGNOSIS — K7460 Unspecified cirrhosis of liver: Secondary | ICD-10-CM | POA: Diagnosis not present

## 2022-01-16 DIAGNOSIS — E1100 Type 2 diabetes mellitus with hyperosmolarity without nonketotic hyperglycemic-hyperosmolar coma (NKHHC): Secondary | ICD-10-CM | POA: Diagnosis not present

## 2022-01-16 DIAGNOSIS — C61 Malignant neoplasm of prostate: Secondary | ICD-10-CM | POA: Diagnosis not present

## 2022-01-16 DIAGNOSIS — N179 Acute kidney failure, unspecified: Secondary | ICD-10-CM | POA: Diagnosis not present

## 2022-01-16 DIAGNOSIS — I959 Hypotension, unspecified: Secondary | ICD-10-CM | POA: Diagnosis not present

## 2022-01-16 DIAGNOSIS — D649 Anemia, unspecified: Secondary | ICD-10-CM | POA: Diagnosis not present

## 2022-01-16 DIAGNOSIS — K766 Portal hypertension: Secondary | ICD-10-CM | POA: Diagnosis not present

## 2022-01-16 DIAGNOSIS — I8510 Secondary esophageal varices without bleeding: Secondary | ICD-10-CM | POA: Diagnosis not present

## 2022-01-16 DIAGNOSIS — K7030 Alcoholic cirrhosis of liver without ascites: Secondary | ICD-10-CM | POA: Diagnosis not present

## 2022-01-16 DIAGNOSIS — F1011 Alcohol abuse, in remission: Secondary | ICD-10-CM | POA: Diagnosis not present

## 2022-01-16 DIAGNOSIS — E1165 Type 2 diabetes mellitus with hyperglycemia: Secondary | ICD-10-CM | POA: Diagnosis not present

## 2022-01-16 DIAGNOSIS — E860 Dehydration: Secondary | ICD-10-CM | POA: Diagnosis not present

## 2022-01-16 DIAGNOSIS — E875 Hyperkalemia: Secondary | ICD-10-CM | POA: Diagnosis not present

## 2022-01-16 DIAGNOSIS — E11649 Type 2 diabetes mellitus with hypoglycemia without coma: Secondary | ICD-10-CM | POA: Diagnosis not present

## 2022-01-16 DIAGNOSIS — R195 Other fecal abnormalities: Secondary | ICD-10-CM | POA: Diagnosis not present

## 2022-01-26 DIAGNOSIS — Z09 Encounter for follow-up examination after completed treatment for conditions other than malignant neoplasm: Secondary | ICD-10-CM | POA: Diagnosis not present

## 2022-01-26 DIAGNOSIS — Z794 Long term (current) use of insulin: Secondary | ICD-10-CM | POA: Diagnosis not present

## 2022-01-26 DIAGNOSIS — Z6823 Body mass index (BMI) 23.0-23.9, adult: Secondary | ICD-10-CM | POA: Diagnosis not present

## 2022-01-26 DIAGNOSIS — I10 Essential (primary) hypertension: Secondary | ICD-10-CM | POA: Diagnosis not present

## 2022-01-26 DIAGNOSIS — E119 Type 2 diabetes mellitus without complications: Secondary | ICD-10-CM | POA: Diagnosis not present

## 2022-01-30 DIAGNOSIS — E1122 Type 2 diabetes mellitus with diabetic chronic kidney disease: Secondary | ICD-10-CM | POA: Diagnosis not present

## 2022-02-02 DIAGNOSIS — E119 Type 2 diabetes mellitus without complications: Secondary | ICD-10-CM | POA: Diagnosis not present

## 2022-02-03 DIAGNOSIS — E119 Type 2 diabetes mellitus without complications: Secondary | ICD-10-CM | POA: Diagnosis not present

## 2022-02-16 DIAGNOSIS — K7030 Alcoholic cirrhosis of liver without ascites: Secondary | ICD-10-CM | POA: Diagnosis not present

## 2022-02-16 DIAGNOSIS — D500 Iron deficiency anemia secondary to blood loss (chronic): Secondary | ICD-10-CM | POA: Diagnosis not present

## 2022-02-17 DIAGNOSIS — I129 Hypertensive chronic kidney disease with stage 1 through stage 4 chronic kidney disease, or unspecified chronic kidney disease: Secondary | ICD-10-CM | POA: Diagnosis not present

## 2022-02-17 DIAGNOSIS — D631 Anemia in chronic kidney disease: Secondary | ICD-10-CM | POA: Diagnosis not present

## 2022-02-17 DIAGNOSIS — E1122 Type 2 diabetes mellitus with diabetic chronic kidney disease: Secondary | ICD-10-CM | POA: Diagnosis not present

## 2022-02-17 DIAGNOSIS — N183 Chronic kidney disease, stage 3 unspecified: Secondary | ICD-10-CM | POA: Diagnosis not present

## 2022-02-21 DIAGNOSIS — I129 Hypertensive chronic kidney disease with stage 1 through stage 4 chronic kidney disease, or unspecified chronic kidney disease: Secondary | ICD-10-CM | POA: Diagnosis not present

## 2022-02-21 DIAGNOSIS — E1122 Type 2 diabetes mellitus with diabetic chronic kidney disease: Secondary | ICD-10-CM | POA: Diagnosis not present

## 2022-02-23 DIAGNOSIS — Z7901 Long term (current) use of anticoagulants: Secondary | ICD-10-CM | POA: Diagnosis not present

## 2022-02-23 DIAGNOSIS — K7030 Alcoholic cirrhosis of liver without ascites: Secondary | ICD-10-CM | POA: Diagnosis not present

## 2022-02-28 DIAGNOSIS — K219 Gastro-esophageal reflux disease without esophagitis: Secondary | ICD-10-CM | POA: Diagnosis not present

## 2022-02-28 DIAGNOSIS — R188 Other ascites: Secondary | ICD-10-CM | POA: Diagnosis not present

## 2022-02-28 DIAGNOSIS — K7682 Hepatic encephalopathy: Secondary | ICD-10-CM | POA: Diagnosis not present

## 2022-02-28 DIAGNOSIS — I8500 Esophageal varices without bleeding: Secondary | ICD-10-CM | POA: Diagnosis not present

## 2022-02-28 DIAGNOSIS — K7030 Alcoholic cirrhosis of liver without ascites: Secondary | ICD-10-CM | POA: Diagnosis not present

## 2022-03-03 DIAGNOSIS — D509 Iron deficiency anemia, unspecified: Secondary | ICD-10-CM | POA: Diagnosis not present

## 2022-03-03 DIAGNOSIS — K5900 Constipation, unspecified: Secondary | ICD-10-CM | POA: Diagnosis not present

## 2022-03-03 DIAGNOSIS — Z7401 Bed confinement status: Secondary | ICD-10-CM | POA: Diagnosis not present

## 2022-03-03 DIAGNOSIS — R4182 Altered mental status, unspecified: Secondary | ICD-10-CM | POA: Diagnosis not present

## 2022-03-03 DIAGNOSIS — E871 Hypo-osmolality and hyponatremia: Secondary | ICD-10-CM | POA: Diagnosis not present

## 2022-03-03 DIAGNOSIS — E872 Acidosis, unspecified: Secondary | ICD-10-CM | POA: Diagnosis not present

## 2022-03-03 DIAGNOSIS — I959 Hypotension, unspecified: Secondary | ICD-10-CM | POA: Diagnosis not present

## 2022-03-03 DIAGNOSIS — R58 Hemorrhage, not elsewhere classified: Secondary | ICD-10-CM | POA: Diagnosis not present

## 2022-03-03 DIAGNOSIS — Z4682 Encounter for fitting and adjustment of non-vascular catheter: Secondary | ICD-10-CM | POA: Diagnosis not present

## 2022-03-03 DIAGNOSIS — M2550 Pain in unspecified joint: Secondary | ICD-10-CM | POA: Diagnosis not present

## 2022-03-03 DIAGNOSIS — E119 Type 2 diabetes mellitus without complications: Secondary | ICD-10-CM | POA: Diagnosis not present

## 2022-03-03 DIAGNOSIS — K7682 Hepatic encephalopathy: Secondary | ICD-10-CM | POA: Diagnosis not present

## 2022-03-03 DIAGNOSIS — R1111 Vomiting without nausea: Secondary | ICD-10-CM | POA: Diagnosis not present

## 2022-03-03 DIAGNOSIS — K766 Portal hypertension: Secondary | ICD-10-CM | POA: Diagnosis not present

## 2022-03-03 DIAGNOSIS — K7030 Alcoholic cirrhosis of liver without ascites: Secondary | ICD-10-CM | POA: Diagnosis not present

## 2022-03-03 DIAGNOSIS — K3189 Other diseases of stomach and duodenum: Secondary | ICD-10-CM | POA: Diagnosis not present

## 2022-03-04 DIAGNOSIS — E871 Hypo-osmolality and hyponatremia: Secondary | ICD-10-CM | POA: Diagnosis not present

## 2022-03-04 DIAGNOSIS — K7682 Hepatic encephalopathy: Secondary | ICD-10-CM | POA: Diagnosis not present

## 2022-03-04 DIAGNOSIS — K766 Portal hypertension: Secondary | ICD-10-CM | POA: Diagnosis not present

## 2022-03-05 DIAGNOSIS — E119 Type 2 diabetes mellitus without complications: Secondary | ICD-10-CM | POA: Diagnosis not present

## 2022-03-05 DIAGNOSIS — E871 Hypo-osmolality and hyponatremia: Secondary | ICD-10-CM | POA: Diagnosis not present

## 2022-03-05 DIAGNOSIS — K7682 Hepatic encephalopathy: Secondary | ICD-10-CM | POA: Diagnosis not present

## 2022-03-05 DIAGNOSIS — K766 Portal hypertension: Secondary | ICD-10-CM | POA: Diagnosis not present

## 2022-03-06 DIAGNOSIS — E871 Hypo-osmolality and hyponatremia: Secondary | ICD-10-CM | POA: Diagnosis not present

## 2022-03-06 DIAGNOSIS — K7682 Hepatic encephalopathy: Secondary | ICD-10-CM | POA: Diagnosis not present

## 2022-03-06 DIAGNOSIS — K766 Portal hypertension: Secondary | ICD-10-CM | POA: Diagnosis not present

## 2022-04-04 DIAGNOSIS — E119 Type 2 diabetes mellitus without complications: Secondary | ICD-10-CM | POA: Diagnosis not present

## 2022-05-05 DIAGNOSIS — E119 Type 2 diabetes mellitus without complications: Secondary | ICD-10-CM | POA: Diagnosis not present

## 2022-05-12 DIAGNOSIS — R531 Weakness: Secondary | ICD-10-CM | POA: Diagnosis not present

## 2022-05-12 DIAGNOSIS — Z7401 Bed confinement status: Secondary | ICD-10-CM | POA: Diagnosis not present

## 2022-05-12 DIAGNOSIS — R5381 Other malaise: Secondary | ICD-10-CM | POA: Diagnosis not present

## 2022-05-19 IMAGING — US IR PARACENTESIS
1 series · 3 of 3 positions shown · non-contrast
Comparison: none

INDICATION: Patient with history of alcohol abuse, prostate cancer. Presented to
the ED with abdominal distension. Found to have ascites. Request is
for therapeutic and diagnostic paracentesis

[Series 1: ir (person_name)/(person_name) · 3 of 3 slices shown]
[im 1/3]
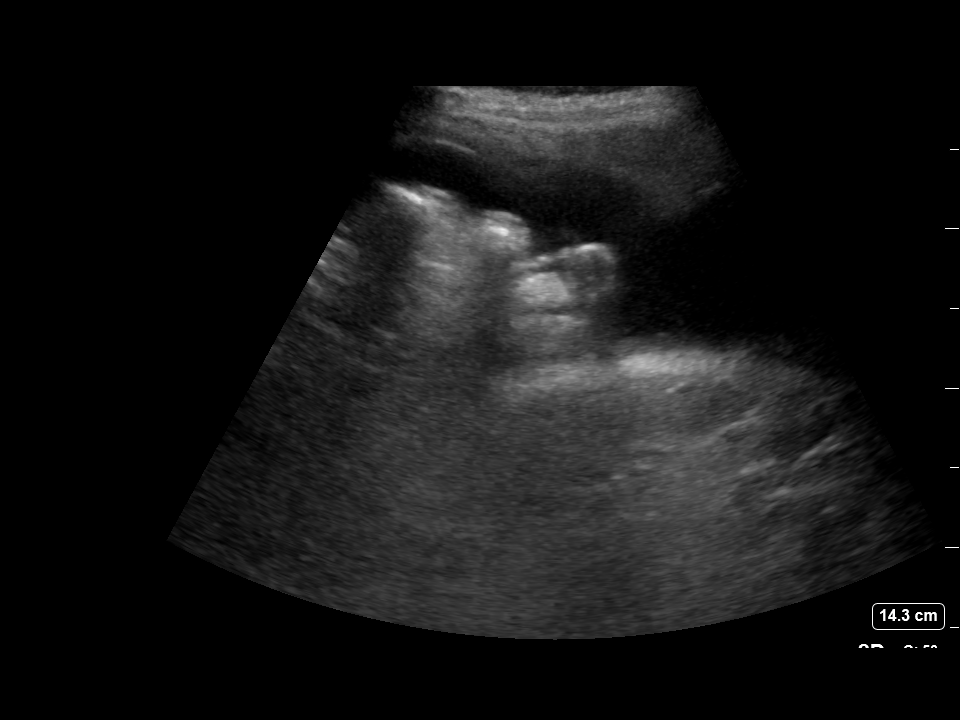
[im 2/3]
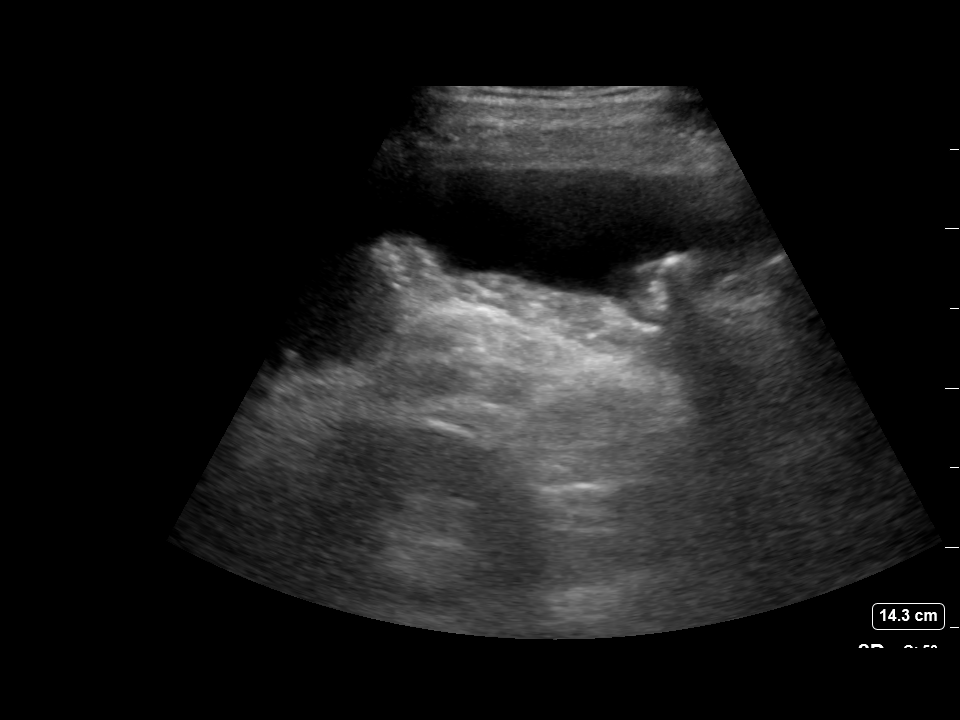
[im 3/3]
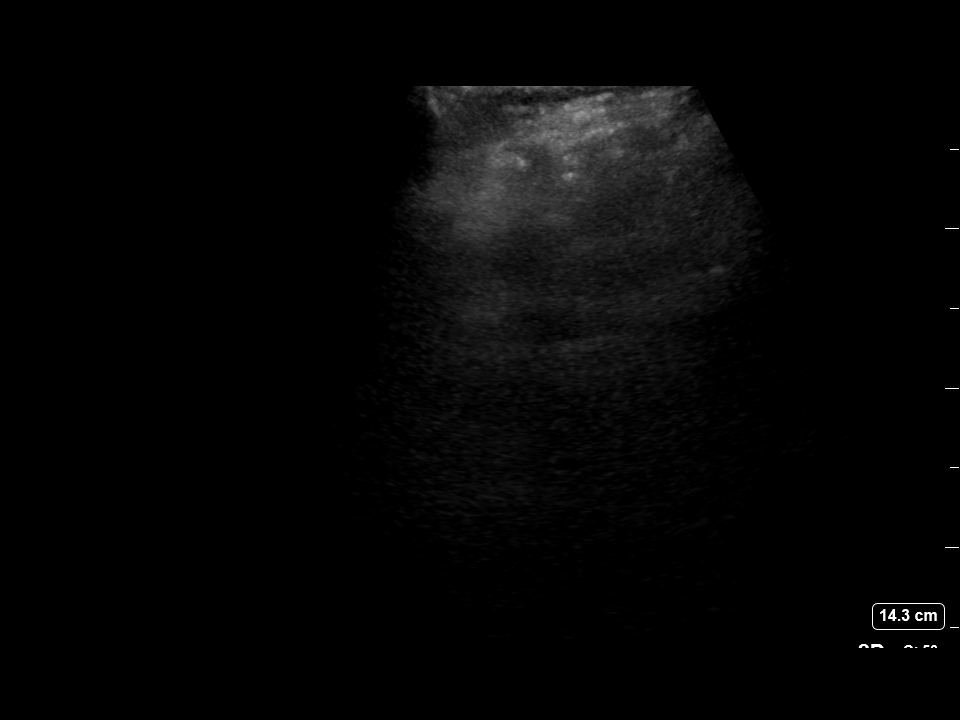

[3 of 3 positions shown; findings below may reference images not displayed]

EXAM:
ULTRASOUND GUIDED diagnostic and therapeutic PARACENTESIS

MEDICATIONS:
Lidocaine 1% 10 mL

COMPLICATIONS:
None immediate.

PROCEDURE:
Informed written consent was obtained from the patient after a
discussion of the risks, benefits and alternatives to treatment. A
timeout was performed prior to the initiation of the procedure.

Initial ultrasound scanning demonstrates a small amount of ascites
within the left lower abdominal quadrant. The left lower abdomen was
prepped and draped in the usual sterile fashion. 1% lidocaine was
used for local anesthesia.

Following this, a 19 gauge, 10-cm, Yueh catheter was introduced. An
ultrasound image was saved for documentation purposes. The
paracentesis was performed. The catheter was removed and a dressing
was applied. The patient tolerated the procedure well without
immediate post procedural complication.
FINDINGS: A total of approximately 2.2 L of straw-colored fluid was removed.
Samples were sent to the laboratory as requested by the clinical
team.
IMPRESSION: Successful ultrasound-guided diagnostic and therapeutic paracentesis
yielding 2.2 liters of peritoneal fluid.

Read by: Swati Kugler, NP

## 2022-05-22 IMAGING — US IR PARACENTESIS
1 series · 3 of 3 positions shown · non-contrast
Comparison: none

INDICATION: Patient with history of alcohol abuse, prostate cancer. Request for
therapeutic paracentesis.

[Series 1: ir (person_name)/(person_name) · 3 of 3 slices shown]
[im 1/3]
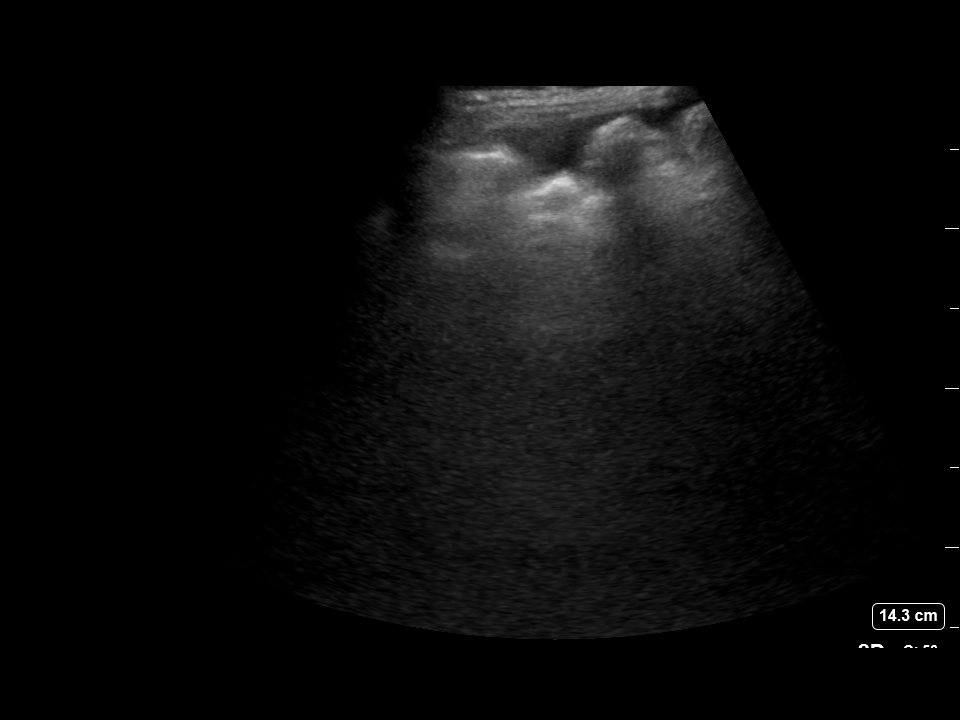
[im 2/3]
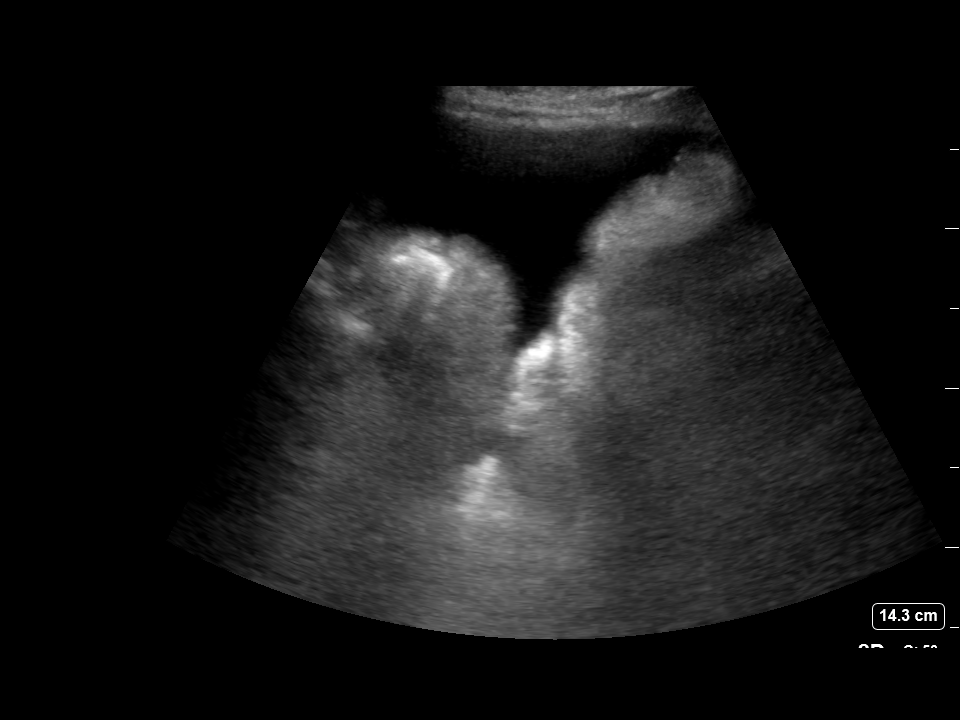
[im 3/3]
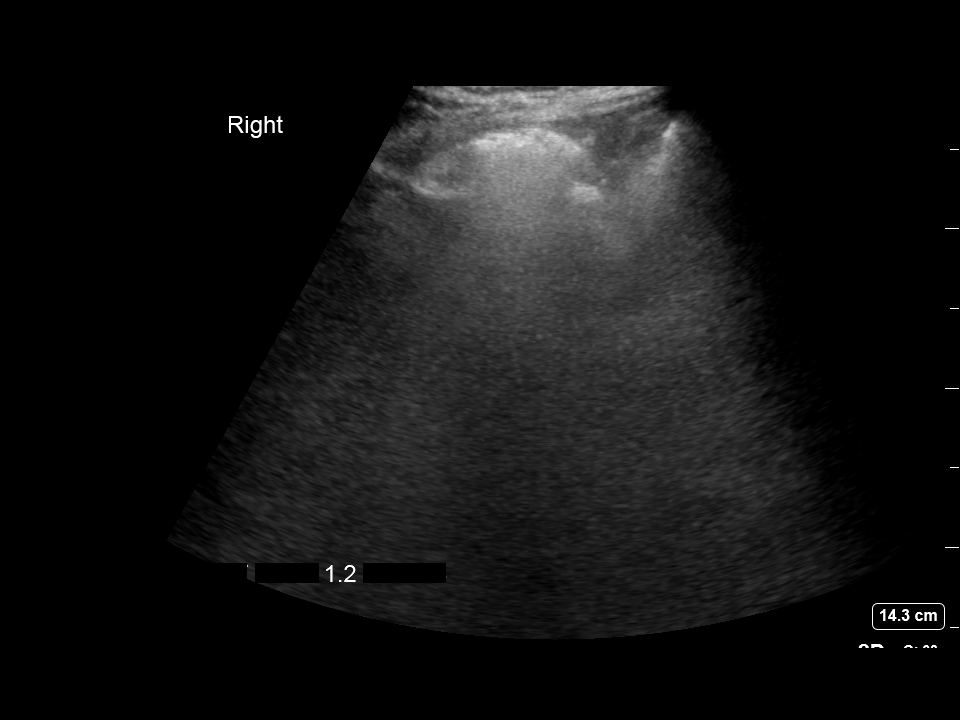

[3 of 3 positions shown; findings below may reference images not displayed]

EXAM:
ULTRASOUND GUIDED THERAPEUTIC RIGHT LOWER QUADRANT PARACENTESIS

MEDICATIONS:
10 mL 1 % lidocaine

COMPLICATIONS:
None immediate.

PROCEDURE:
Informed written consent was obtained from the patient after a
discussion of the risks, benefits and alternatives to treatment. A
timeout was performed prior to the initiation of the procedure.

Initial ultrasound scanning demonstrates a moderate amount of
ascites within the right lower abdominal quadrant. The right lower
abdomen was prepped and draped in the usual sterile fashion. 1%
lidocaine was used for local anesthesia.

Following this, a 19 gauge, 10-cm, Yueh catheter was introduced. An
ultrasound image was saved for documentation purposes. The
paracentesis was performed. The catheter was removed and a dressing
was applied. The patient tolerated the procedure well without
immediate post procedural complication.
FINDINGS: A total of approximately 1.2 L of clear, yellow fluid was removed.
IMPRESSION: Successful ultrasound-guided paracentesis yielding 1.2 liters of
peritoneal fluid.

## 2022-06-02 DEATH — deceased
